# Patient Record
Sex: Female | Born: 1960
Health system: Southern US, Community
[De-identification: ages and names within clinical notes are randomized; demographics above are authoritative.]

## PROBLEM LIST (undated history)

## (undated) DIAGNOSIS — D509 Iron deficiency anemia, unspecified: Secondary | ICD-10-CM

## (undated) DIAGNOSIS — K760 Fatty (change of) liver, not elsewhere classified: Secondary | ICD-10-CM

## (undated) DIAGNOSIS — Z720 Tobacco use: Secondary | ICD-10-CM

## (undated) DIAGNOSIS — F1010 Alcohol abuse, uncomplicated: Secondary | ICD-10-CM

## (undated) DIAGNOSIS — D259 Leiomyoma of uterus, unspecified: Secondary | ICD-10-CM

## (undated) DIAGNOSIS — K859 Acute pancreatitis without necrosis or infection, unspecified: Secondary | ICD-10-CM

## (undated) DIAGNOSIS — D696 Thrombocytopenia, unspecified: Secondary | ICD-10-CM

## (undated) DIAGNOSIS — I10 Essential (primary) hypertension: Secondary | ICD-10-CM

## (undated) HISTORY — DX: Thrombocytopenia, unspecified: D69.6

## (undated) HISTORY — PX: OTHER SURGICAL HISTORY: SHX169

## (undated) HISTORY — DX: Alcohol abuse, uncomplicated: F10.10

## (undated) HISTORY — DX: Fatty (change of) liver, not elsewhere classified: K76.0

## (undated) HISTORY — DX: Leiomyoma of uterus, unspecified: D25.9

## (undated) HISTORY — DX: Tobacco use: Z72.0

## (undated) HISTORY — PX: BACK SURGERY: SHX140

## (undated) HISTORY — DX: Iron deficiency anemia, unspecified: D50.9

---

## 1998-07-07 ENCOUNTER — Encounter: Payer: Self-pay | Admitting: Emergency Medicine

## 1998-07-07 ENCOUNTER — Emergency Department (HOSPITAL_COMMUNITY): Admission: EM | Admit: 1998-07-07 | Discharge: 1998-07-07 | Payer: Self-pay | Admitting: Emergency Medicine

## 1999-05-24 ENCOUNTER — Emergency Department (HOSPITAL_COMMUNITY): Admission: EM | Admit: 1999-05-24 | Discharge: 1999-05-24 | Payer: Self-pay | Admitting: Emergency Medicine

## 2002-01-06 ENCOUNTER — Emergency Department (HOSPITAL_COMMUNITY): Admission: EM | Admit: 2002-01-06 | Discharge: 2002-01-06 | Payer: Self-pay | Admitting: Emergency Medicine

## 2002-01-06 ENCOUNTER — Encounter: Payer: Self-pay | Admitting: Emergency Medicine

## 2003-02-19 ENCOUNTER — Emergency Department (HOSPITAL_COMMUNITY): Admission: EM | Admit: 2003-02-19 | Discharge: 2003-02-19 | Payer: Self-pay | Admitting: Emergency Medicine

## 2003-10-31 ENCOUNTER — Emergency Department (HOSPITAL_COMMUNITY): Admission: EM | Admit: 2003-10-31 | Discharge: 2003-10-31 | Payer: Self-pay | Admitting: Emergency Medicine

## 2007-04-06 ENCOUNTER — Emergency Department (HOSPITAL_COMMUNITY): Admission: EM | Admit: 2007-04-06 | Discharge: 2007-04-06 | Payer: Self-pay | Admitting: Emergency Medicine

## 2007-06-12 ENCOUNTER — Emergency Department (HOSPITAL_COMMUNITY): Admission: EM | Admit: 2007-06-12 | Discharge: 2007-06-12 | Payer: Self-pay | Admitting: Emergency Medicine

## 2009-11-13 ENCOUNTER — Inpatient Hospital Stay (HOSPITAL_COMMUNITY): Admission: EM | Admit: 2009-11-13 | Discharge: 2009-11-17 | Payer: Self-pay | Admitting: Emergency Medicine

## 2009-11-13 ENCOUNTER — Ambulatory Visit: Payer: Self-pay | Admitting: Internal Medicine

## 2009-11-15 ENCOUNTER — Encounter: Payer: Self-pay | Admitting: Internal Medicine

## 2009-11-15 DIAGNOSIS — D259 Leiomyoma of uterus, unspecified: Secondary | ICD-10-CM | POA: Insufficient documentation

## 2009-11-15 DIAGNOSIS — F1010 Alcohol abuse, uncomplicated: Secondary | ICD-10-CM | POA: Insufficient documentation

## 2009-11-15 DIAGNOSIS — D509 Iron deficiency anemia, unspecified: Secondary | ICD-10-CM

## 2009-11-15 DIAGNOSIS — F17200 Nicotine dependence, unspecified, uncomplicated: Secondary | ICD-10-CM

## 2009-11-22 ENCOUNTER — Encounter: Payer: Self-pay | Admitting: Internal Medicine

## 2009-11-22 ENCOUNTER — Ambulatory Visit: Payer: Self-pay | Admitting: Internal Medicine

## 2009-11-22 DIAGNOSIS — D696 Thrombocytopenia, unspecified: Secondary | ICD-10-CM | POA: Insufficient documentation

## 2009-11-22 LAB — CONVERTED CEMR LAB
Basophils Absolute: 0.1 10*3/uL (ref 0.0–0.1)
Basophils Relative: 1 % (ref 0–1)
Eosinophils Absolute: 0.2 10*3/uL (ref 0.0–0.7)
Eosinophils Relative: 3 % (ref 0–5)
HCT: 34.8 % — ABNORMAL LOW (ref 36.0–46.0)
Hemoglobin: 10.2 g/dL — ABNORMAL LOW (ref 12.0–15.0)
Lymphocytes Relative: 25 % (ref 12–46)
Lymphs Abs: 1.8 10*3/uL (ref 0.7–4.0)
MCHC: 29.3 g/dL — ABNORMAL LOW (ref 30.0–36.0)
MCV: 74.8 fL — ABNORMAL LOW (ref >=78.0)
Monocytes Absolute: 0.6 10*3/uL (ref 0.1–1.0)
Monocytes Relative: 8 % (ref 3–12)
Neutro Abs: 4.4 10*3/uL (ref 1.7–7.7)
Neutrophils Relative %: 63 % (ref 43–77)
Platelets: 65 10*3/uL — ABNORMAL LOW (ref 150–400)
RBC: 4.65 M/uL (ref 3.87–5.11)
RDW: 28.9 % — ABNORMAL HIGH (ref 11.5–15.5)
WBC: 7 10*3/uL (ref 4.0–10.5)

## 2009-11-23 LAB — CONVERTED CEMR LAB

## 2009-11-29 ENCOUNTER — Ambulatory Visit: Payer: Self-pay | Admitting: Internal Medicine

## 2009-11-30 LAB — CONVERTED CEMR LAB
Basophils Absolute: 0.1 10*3/uL (ref 0.0–0.1)
Basophils Relative: 1 % (ref 0–1)
Eosinophils Absolute: 0.7 10*3/uL (ref 0.0–0.7)
MCHC: 28.7 g/dL — ABNORMAL LOW (ref 30.0–36.0)
MCV: 80 fL (ref >=78.0)
Monocytes Relative: 6 % (ref 3–12)
Neutro Abs: 5.6 10*3/uL (ref 1.7–7.7)
Neutrophils Relative %: 63 % (ref 43–77)
RBC: 4.79 M/uL (ref 3.87–5.11)
RDW: 28.5 % — ABNORMAL HIGH (ref 11.5–15.5)

## 2009-12-20 ENCOUNTER — Telehealth: Payer: Self-pay | Admitting: Internal Medicine

## 2010-03-08 NOTE — Miscellaneous (Signed)
Summary: PATIENT CONSENT FORM  PATIENT CONSENT FORM   Imported By: Louretta Parma 11/29/2009 16:51:55  _____________________________________________________________________  External Attachment:    Type:   Image     Comment:   External Document

## 2010-03-08 NOTE — Discharge Summary (Signed)
Summary: Hospital Discharge Update    Hospital Discharge Update:  Date of Admission: 11/13/2009 Date of Discharge: 11/17/2009  Brief Summary:  50 y/o woman with PMH significant for menorrhagia presented with severe microcytic iron deficiency anemia. Transvaginal USG showed uterine fibroids. Patient was transfused 3 units of PRBCs and started on iron supplementation. GI was consulted regarding possible colonic etiology for bleeding, as patient did endorse having episodes of blood streaked stools.  Patient was discharged home with outpatient follow up with [facility] to discuss further management with uterine firbroids. She underwent colonoscopy on 10/12 for her hematochezia which showed some internal hemmorhoids.  Labs needed at follow-up: CBC with differential  Other follow-up issues:  Please counsel patient regarding alcohol use.    Problem list changes:  Added new problem of ANEMIA, IRON DEFICIENCY (ICD-280.9) Added new problem of FIBROIDS, UTERUS (ICD-218.9) Added new problem of TOBACCO ABUSE (ICD-305.1) Added new problem of ALCOHOL ABUSE (ICD-305.00) Added new problem of TRICHOMONAL VULVOVAGINITIS (ICD-131.01)  Medication list changes:  Added new medication of FERRO-BOB 325 (65 FE) MG TABS (FERROUS SULFATE) Take 0ne tablet with food three times a day - Signed Added new medication of METRONIDAZOLE 500 MG TABS (METRONIDAZOLE) Take one tab by mouth two times a day for 3 more days. - Signed Rx of FERRO-BOB 325 (65 FE) MG TABS (FERROUS SULFATE) Take 0ne tablet with food three times a day;  #30 x 3;  Signed;  Entered by: Elyse Jarvis;  Authorized by: Elyse Jarvis;  Method used: Print then Give to Patient Rx of METRONIDAZOLE 500 MG TABS (METRONIDAZOLE) Take one tab by mouth two times a day for 3 more days.;  #6 x 0;  Signed;  Entered by: Elyse Jarvis;  Authorized by: Elyse Jarvis;  Method used: Print then Give to Patient  Discharge medications:  FERRO-BOB 325 (65 FE) MG TABS  (FERROUS SULFATE) Take 0ne tablet with food three times a day METRONIDAZOLE 500 MG TABS (METRONIDAZOLE) Take one tab by mouth two times a day for 3 more days.  Other patient instructions:  Please follow up with [facility] outpatient clinic on November 11th, at 2.30 pm. Please follow up with [facility] outpatient clinic, located on the ground floor of the [facility] near the cafeteria with Dr. Scot Dock on October 17th, at 1:30PM. Please come to the clinic at 12:15 to have your blood drawn, to see your blood levels.   Note: Hospital Discharge Medications & Other Instructions handout was printed, one copy for patient and a second copy to be placed in hospital chart.    Complete Medication List: 1)  Ferro-bob 325 (65 Fe) Mg Tabs (Ferrous sulfate) .... Take 0ne tablet with food three times a day 2)  Metronidazole 500 Mg Tabs (Metronidazole) .... Take one tab by mouth two times a day for 3 more days.   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 Lives with her grown daughter who is currently pregnant Single Sexually active with men only Drinks a six pack of beer daily    Appended Document: Orders Update    Clinical Lists Changes  Orders: Added new Test order of T-CBC w/Diff [phone]) - Signed      Process Orders Check Orders Results:     [facility]: ABN not required for this insurance Order queued for requisitioning for [facility]: November 22, 2009 12:04 PM  Tests Sent for requisitioning (November 22, 2009 2:19 PM):     11/22/2009: [facility] -- [facility] w/Diff [[id number]] (signed)

## 2010-03-08 NOTE — Assessment & Plan Note (Signed)
Summary: EST-1 WEEK RECHECK/CH   Vital Signs:  Patient profile:   50 year old female Height:      66.5 inches Weight:      163.2 pounds BMI:     26.04 Temp:     98.3 degrees F oral Pulse rate:   64 / minute BP sitting:   104 / 64  (right arm)  Vitals Entered By: Filomena Jungling NT II (November 29, 2009 2:02 PM) CC: follow-up visit Is Patient Diabetic? No Pain Assessment Patient in pain? no      Nutritional Status BMI of 25 - 29 = overweight  Have you ever been in a relationship where you felt threatened, hurt or afraid?No   Does patient need assistance? Functional Status Self care Ambulation Normal   CC:  follow-up visit.  History of Present Illness: 50 Y/O W WITH H/O IRON DEF ANEMIA AND THROMBOCYTOPENIA (NO CAUSE IDENTIFIED YET) comes for repeat CBC check. no new complaints started taking iron tablets   Preventive Screening-Counseling & Management  Alcohol-Tobacco     Smoking Status: current     Year Started: AGE 30     Cigars/week: 4 A DAY  Current Medications (verified): 1)  Ferro-Bob 325 (65 Fe) Mg Tabs (Ferrous Sulfate) .... Take 0ne Tablet With Food Three Times A Day  Allergies (verified): No Known Drug Allergies  Review of Systems  The patient denies anorexia, fever, weight loss, weight gain, vision loss, decreased hearing, hoarseness, chest pain, syncope, dyspnea on exertion, peripheral edema, prolonged cough, headaches, hemoptysis, abdominal pain, melena, hematochezia, severe indigestion/heartburn, hematuria, incontinence, genital sores, muscle weakness, suspicious skin lesions, transient blindness, difficulty walking, depression, unusual weight change, abnormal bleeding, enlarged lymph nodes, angioedema, breast masses, and testicular masses.    Physical Exam  General:  Gen: VS reveiwed, Alert, well developed, nodistress ENT: mucous membranes pink & moist. No abnormal finds in ear and nose. CVC:S1 S2 , no murmurs, no abnormal heart sounds. Lungs: Clear  to auscultation B/L. No wheezes, crackles or other abnormal sounds Abdomen: soft, non distended, no tender. Normal Bowel sounds EXT: no pitting edema, no engorged veins, Pulsations normal  Neuro:alert, oriented *3, cranial nerved 2-12 intact, strenght normal in all  extremities, senstations normal to light touch.      Impression & Recommendations:  Problem # 1:  THROMBOCYTOPENIA (ICD-287.5)  recheck CBC today  Orders: T-CBC w/Diff ([id number])  Problem # 2:  ANEMIA, IRON DEFICIENCY (ICD-280.9)  started taking iron tablets. recheck CBC  Her updated medication list for this problem includes:    Ferro-bob 325 (65 Fe) Mg Tabs (Ferrous sulfate) .Marland Kitchen... Take 0ne tablet with food three times a day  Orders: T-CBC w/Diff ([id number])  Complete Medication List: 1)  Ferro-bob 325 (65 Fe) Mg Tabs (Ferrous sulfate) .... Take 0ne tablet with food three times a day  Patient Instructions: 1)  Please schedule a follow-up appointment in 3 months.   Orders Added: 1)  Est. Patient Level III [99213] 2)  T-CBC w/Diff [[id number]]    Prevention & Chronic Care Immunizations   Influenza vaccine: Not documented    Tetanus booster: Not documented    Pneumococcal vaccine: Not documented  Other Screening   Pap smear: Not documented    Mammogram: Not documented   Smoking status: current  (11/29/2009)  Lipids   Total Cholesterol: Not documented   LDL: Not documented   LDL Direct: Not documented   HDL: Not documented   Triglycerides: Not documented

## 2010-03-08 NOTE — Progress Notes (Signed)
Summary: GYN APPOINTMENT   Phone Note Call from Patient Call back at Home Phone [phone]   Caller: Patient Reason for Call: Referral Action Taken: Appt Scheduled Details for Reason: GYN APPOINTMENT Summary of Call: PATIENT CALLED ON FRIDAY AND WAS GIVEN THE WRONG DATE FOR GYN, AND THEY HAD ANOTHER APPOINTMENT IN THE APPOINTMENT THAT PATIENT DID NOT NO ABOUT.PAPERWORK AND DISCHARGE SUMMARY WAS SENT TO [facility] AND APPOINTMENT WAS GIVEN FOR  01-06-2010@1 ;30.PATIENT WAS CONTACTED ABOUT THIS APPOINTMENT.I WILL ALSO MAIL LETTER. Initial call taken by: [facility] NT II,  December 20, 2009 2:55 PM

## 2010-03-08 NOTE — Assessment & Plan Note (Signed)
Summary: NEW HFU STAT LABS/CH/SIDHU/CH   Vital Signs:  Patient profile:   50 year old female Height:      66.5 inches Weight:      162 pounds BMI:     25.85 Temp:     97.9 degrees F oral Pulse rate:   62 / minute BP sitting:   122 / 76  (right arm)  Vitals Entered By: Filomena Jungling NT II (November 22, 2009 1:30 PM) CC: HFU Is Patient Diabetic? No Pain Assessment Patient in pain? no       Have you ever been in a relationship where you felt threatened, hurt or afraid?No   Does patient need assistance? Functional Status Self care Ambulation Normal   CC:  HFU.  History of Present Illness: 50 y/o w with iron def anemia 2/2 fibroids comes for hopsital follow up  she has not had any vaginal bleeding since d/c has not been taking iron tabs due to financial reasons has appointment with ob-gyn for fibroids on 11/11 her Hb at d/c was 8 and platelets was normal no new complaints    Preventive Screening-Counseling & Management  Alcohol-Tobacco     Smoking Status: current     Year Started: AGE 43     Cigars/week: 4 A DAY  Current Medications (verified): 1)  Ferro-Bob 325 (65 Fe) Mg Tabs (Ferrous Sulfate) .... Take 0ne Tablet With Food Three Times A Day  Allergies (verified): No Known Drug Allergies  Social History: Smoking Status:  current  Review of Systems  The patient denies anorexia, fever, weight loss, weight gain, vision loss, decreased hearing, hoarseness, chest pain, syncope, dyspnea on exertion, peripheral edema, prolonged cough, headaches, hemoptysis, abdominal pain, melena, hematochezia, severe indigestion/heartburn, hematuria, incontinence, genital sores, muscle weakness, suspicious skin lesions, transient blindness, difficulty walking, depression, unusual weight change, abnormal bleeding, enlarged lymph nodes, angioedema, breast masses, and testicular masses.    Physical Exam  General:  General: vital reviewed. Alert, well developed and in no acurte  distress ENT: mucous membranes pale & moist. No abnormal finds in ear and nose. CVC:S1 S2 , no murmurs, no abnormal heart sounds. Lungs: Clear to auscultation B/L. No wheezes, crackles or other abnormal sounds Abdomen: soft, non distended, no tender. Normal Bowel sounds EXT: no pitting edema, no engorged veins, Pulsations normal  Neuro:alert, oriented *3, cranial nerved 2-12 intact, strenght normal in all  extremities, senstations normal to light touch.     Impression & Recommendations:  Problem # 1:  ANEMIA, IRON DEFICIENCY (ICD-280.9) Hb better than it was at time of discharge patient has not been taking iron tablets due to financial problems no bleeding (vagina, stool, vomit). patient has not had her menstrual cycles yet  plan - encourge meds compliance, she says she will buy meds this friday - SW consult for medication assistance  - Ob-Gyn follow up on 11/11 for fibroids   Her updated medication list for this problem includes:    Ferro-bob 325 (65 Fe) Mg Tabs (Ferrous sulfate) .Marland Kitchen... Take 0ne tablet with food three times a day  Problem # 2:  THROMBOCYTOPENIA (ICD-287.5) paltelets normal in hospital although level was consistently falling. no heparin given in hospital  plan -recheck in a week - peripheral smear - HIV  Orders: T-HIV Antibody  (Reflex) ([id number]) T- * Misc. Laboratory test ([phone])  Complete Medication List: 1)  Ferro-bob 325 (65 Fe) Mg Tabs (Ferrous sulfate) .... Take 0ne tablet with food three times a day  Patient Instructions: 1)  Follow up in  1 week 2)  You have low platelet count in your blood. This could cause you to have easy bruising. We will test your blood for the cause of low platelet, which includes an HIV test. We will call you if any of these blood tests are abnormal. We will see you back in 1 week for repeat blood work.    Orders Added: 1)  T-HIV Antibody  (Reflex) [[id number]] 2)  T- * Misc. Laboratory test [[id number]] 3)  Est. Patient  Level IV [99214]    Prevention & Chronic Care Immunizations   Influenza vaccine: Not documented    Tetanus booster: Not documented    Pneumococcal vaccine: Not documented  Other Screening   Pap smear: Not documented    Mammogram: Not documented   Smoking status: current  (11/22/2009)  Lipids   Total Cholesterol: Not documented   LDL: Not documented   LDL Direct: Not documented   HDL: Not documented   Triglycerides: Not documented   Process Orders Check Orders Results:     [facility]: ABN not required for this insurance Order queued for requisitioning for [facility]: November 22, 2009 2:20 PM  Tests Sent for requisitioning (November 22, 2009 2:20 PM):     11/22/2009: [facility] -- T-HIV Antibody  (Reflex) [[id number]] (signed)     11/22/2009: [facility] -- T- * Misc. Laboratory test [phone] (signed)

## 2010-03-09 ENCOUNTER — Ambulatory Visit: Admit: 2010-03-09 | Payer: Self-pay | Admitting: Obstetrics & Gynecology

## 2010-03-09 ENCOUNTER — Encounter: Payer: Self-pay | Admitting: Obstetrics & Gynecology

## 2010-04-05 ENCOUNTER — Ambulatory Visit (INDEPENDENT_AMBULATORY_CARE_PROVIDER_SITE_OTHER): Payer: Self-pay | Admitting: Ophthalmology

## 2010-04-05 ENCOUNTER — Encounter: Payer: Self-pay | Admitting: Internal Medicine

## 2010-04-05 DIAGNOSIS — D509 Iron deficiency anemia, unspecified: Secondary | ICD-10-CM

## 2010-04-05 DIAGNOSIS — D696 Thrombocytopenia, unspecified: Secondary | ICD-10-CM

## 2010-04-05 DIAGNOSIS — D259 Leiomyoma of uterus, unspecified: Secondary | ICD-10-CM

## 2010-04-05 NOTE — Progress Notes (Signed)
  Subjective:    Patient ID: Evelyn Crawford, female    DOB: 1960/12/30, 50 y.o.   MRN: [id number]  HPI This is a 50 year old female with a past medical hx significant for thrombocytopenia of unknown etiology and iron deficiency anemia felt to be the result of chronic blood loss due to uterine fibroids who was last seen 10/11 by Dr. Scot Dock for a repeat CBC check. At that time Hgb was 11.0 and platlets were 125. This is improved from prior on 10/17.  Pt was started on Iron and referred to OB.  The patient was hospitalized back in October for a Hgb of 5.3 requiring transfusion and transvaginal ultrasound showed a large uterine fibroid.  Pt was scheduled to fu with GYN, and pt went but she did not have money so they rescheduled her and she missed the rescheduled appt. Needs to see debra Hill today to file for her orange card.  LMP was Feb 8th, and pt had less bleeding than usual periods.  Prior to her hospitalization, pt had experienced blood in her stool but she denies this now. Pt also denies dizzyness or lightheadedness.    Review of Systems  Constitutional: Negative for fever and chills.  Respiratory: Negative for cough and shortness of breath.   Cardiovascular: Negative for chest pain and palpitations.  Gastrointestinal: Negative for vomiting, diarrhea and constipation.       Objective:   Physical Exam  Constitutional: She appears well-developed and well-nourished.  HENT:  Head: Normocephalic and atraumatic.  Eyes: Pupils are equal, round, and reactive to light.  Cardiovascular: Normal rate, regular rhythm and intact distal pulses.  Exam reveals no gallop and no friction rub.   No murmur heard. Pulmonary/Chest: Effort normal and breath sounds normal. She has no wheezes. She has no rales.  Abdominal: Soft. Bowel sounds are normal. She exhibits no distension. There is no tenderness.  Musculoskeletal: Normal range of motion.  Neurological: She is alert. No cranial nerve deficit.  Skin: No  rash noted.        Current Outpatient Prescriptions on File Prior to Visit  Medication Sig Dispense Refill  . ferrous sulfate 325 (65 FE) MG tablet Take 325 mg by mouth 3 (three) times daily with meals.          Past Medical History  Diagnosis Date  . Thrombocytopenia   . Alcohol abuse   . Tobacco abuse   . Uterine fibroid   . Iron deficiency anemia      Assessment & Plan:

## 2010-04-05 NOTE — Assessment & Plan Note (Signed)
The patients LMP was Feb 8th and was lighter than usual.  The patient needs to be seen by GYN and pt will possibly require hysterectomy.  I will re-refer the patient today and have her discuss her financial situation with Jaynee Eagles so ensure that she is able to afford her appointment.

## 2010-04-05 NOTE — Patient Instructions (Addendum)
Please see Jaynee Eagles on your way out today and send in the documentation needed for you to apply for the orange card.  Please continue taking your iron and follow up with your regular doctor in 1-2 months to follow up on your GYN appointment.

## 2010-04-05 NOTE — Assessment & Plan Note (Signed)
The etiology of this is unclear, will recheck CBC today but if pt continues to have a thrombocytopenia, she will require further work up.

## 2010-04-05 NOTE — Assessment & Plan Note (Addendum)
Will recheck a CBC today.  Pt does not have overt signs of anemia at this point and denies any symptoms.  I will continue the patient on her iron at this time.

## 2010-04-06 LAB — CBC
Hemoglobin: 11.4 g/dL — ABNORMAL LOW (ref 12.0–15.0)
MCH: 29.2 pg (ref 26.0–34.0)
MCHC: 31.6 g/dL (ref 30.0–36.0)
Platelets: 192 10*3/uL (ref 150–400)
RBC: 3.91 MIL/uL (ref 3.87–5.11)

## 2010-04-20 LAB — BASIC METABOLIC PANEL
CO2: 22 mEq/L (ref 19–32)
CO2: 23 mEq/L (ref 19–32)
Calcium: 8.9 mg/dL (ref 8.4–10.5)
Calcium: 9 mg/dL (ref 8.4–10.5)
Calcium: 9.1 mg/dL (ref 8.4–10.5)
Chloride: 111 mEq/L (ref 96–112)
GFR calc Af Amer: >60 mL/min (ref >60)
GFR calc Af Amer: >60 mL/min (ref >60)
GFR calc non Af Amer: >60 mL/min (ref >60)
Glucose, Bld: 102 mg/dL — ABNORMAL HIGH (ref 70–99)
Potassium: 3.6 mEq/L (ref 3.5–5.1)
Potassium: 3.7 mEq/L (ref 3.5–5.1)
Potassium: 3.8 mEq/L (ref 3.5–5.1)
Sodium: 136 mEq/L (ref 135–145)
Sodium: 137 mEq/L (ref 135–145)
Sodium: 137 mEq/L (ref 135–145)

## 2010-04-20 LAB — CBC
HCT: 24.6 % — ABNORMAL LOW (ref 36.0–46.0)
HCT: 25.6 % — ABNORMAL LOW (ref 36.0–46.0)
HCT: 28.2 % — ABNORMAL LOW (ref 36.0–46.0)
HCT: 29.2 % — ABNORMAL LOW (ref 36.0–46.0)
Hemoglobin: 7.2 g/dL — ABNORMAL LOW (ref 12.0–15.0)
Hemoglobin: 7.5 g/dL — ABNORMAL LOW (ref 12.0–15.0)
Hemoglobin: 8.2 g/dL — ABNORMAL LOW (ref 12.0–15.0)
Hemoglobin: 8.5 g/dL — ABNORMAL LOW (ref 12.0–15.0)
MCH: 20.4 pg — ABNORMAL LOW (ref 26.0–34.0)
MCH: 20.6 pg — ABNORMAL LOW (ref 26.0–34.0)
MCH: 20.6 pg — ABNORMAL LOW (ref 26.0–34.0)
MCHC: 25.5 g/dL — ABNORMAL LOW (ref 30.0–36.0)
MCHC: 29.1 g/dL — ABNORMAL LOW (ref 30.0–36.0)
MCHC: 29.1 g/dL — ABNORMAL LOW (ref 30.0–36.0)
MCHC: 29.3 g/dL — ABNORMAL LOW (ref 30.0–36.0)
MCV: 70.4 fL — ABNORMAL LOW (ref 78.0–100.0)
MCV: 72.1 fL — ABNORMAL LOW (ref 78.0–100.0)
Platelets: 175 10*3/uL (ref 150–400)
Platelets: 361 10*3/uL (ref 150–400)
RBC: 3.53 MIL/uL — ABNORMAL LOW (ref 3.87–5.11)
RBC: 3.67 MIL/uL — ABNORMAL LOW (ref 3.87–5.11)
RDW: 28.4 % — ABNORMAL HIGH (ref 11.5–15.5)
RDW: 28.8 % — ABNORMAL HIGH (ref 11.5–15.5)
WBC: 6.9 10*3/uL (ref 4.0–10.5)
WBC: 8.1 10*3/uL (ref 4.0–10.5)

## 2010-04-20 LAB — URINALYSIS, ROUTINE W REFLEX MICROSCOPIC
Bilirubin Urine: NEGATIVE
Glucose, UA: NEGATIVE mg/dL
Hgb urine dipstick: NEGATIVE
Ketones, ur: NEGATIVE mg/dL
Nitrite: NEGATIVE
Protein, ur: NEGATIVE mg/dL
Specific Gravity, Urine: 1.017 (ref 1.005–1.030)
Urobilinogen, UA: 0.2 mg/dL (ref 0.0–1.0)
pH: 5.5 (ref 5.0–8.0)

## 2010-04-20 LAB — PROTIME-INR
INR: 1.13 (ref 0.00–1.49)
Prothrombin Time: 14.7 seconds (ref 11.6–15.2)

## 2010-04-20 LAB — PREPARE RBC (CROSSMATCH)

## 2010-04-20 LAB — GC/CHLAMYDIA PROBE AMP, GENITAL: GC Probe Amp, Genital: NEGATIVE

## 2010-04-20 LAB — HEPATIC FUNCTION PANEL
ALT: 16 U/L (ref 0–35)
AST: 21 U/L (ref 0–37)
Albumin: 3.1 g/dL — ABNORMAL LOW (ref 3.5–5.2)
Bilirubin, Direct: <0.1 mg/dL (ref 0.0–0.3)
Total Bilirubin: 0.2 mg/dL — ABNORMAL LOW (ref 0.3–1.2)

## 2010-04-20 LAB — POCT I-STAT, CHEM 8
BUN: 4 mg/dL — ABNORMAL LOW (ref 6–23)
Calcium, Ion: 1.26 mmol/L (ref 1.12–1.32)
HCT: 23 % — ABNORMAL LOW (ref 36.0–46.0)
Hemoglobin: 7.8 g/dL — ABNORMAL LOW (ref 12.0–15.0)
Sodium: 142 mEq/L (ref 135–145)
TCO2: 21 mmol/L (ref 0–100)

## 2010-04-20 LAB — CROSSMATCH
ABO/RH(D): O NEG
Antibody Screen: NEGATIVE

## 2010-04-20 LAB — DIFFERENTIAL
Basophils Absolute: 0.1 10*3/uL (ref 0.0–0.1)
Eosinophils Absolute: 0.2 10*3/uL (ref 0.0–0.7)
Lymphocytes Relative: 23 % (ref 12–46)
Monocytes Absolute: 0.6 10*3/uL (ref 0.1–1.0)
Neutrophils Relative %: 66 % (ref 43–77)

## 2010-04-20 LAB — PREGNANCY, URINE: Preg Test, Ur: NEGATIVE

## 2010-04-20 LAB — URINE MICROSCOPIC-ADD ON

## 2010-04-20 LAB — IRON AND TIBC: Iron: <10 ug/dL — ABNORMAL LOW (ref 42–135)

## 2010-04-20 LAB — WET PREP, GENITAL

## 2010-04-20 LAB — FERRITIN: Ferritin: 2 ng/mL — ABNORMAL LOW (ref 10–291)

## 2010-04-20 LAB — RETICULOCYTES
RBC.: 3.01 MIL/uL — ABNORMAL LOW (ref 3.87–5.11)
Retic Ct Pct: 0.7 % (ref 0.4–3.1)

## 2010-04-20 LAB — ETHANOL: Alcohol, Ethyl (B): <5 mg/dL (ref 0–10)

## 2010-04-20 LAB — HEMOCCULT GUIAC POC 1CARD (OFFICE): Fecal Occult Bld: NEGATIVE

## 2010-04-20 LAB — GLUCOSE, CAPILLARY: Glucose-Capillary: 102 mg/dL — ABNORMAL HIGH (ref 70–99)

## 2010-08-02 ENCOUNTER — Ambulatory Visit (INDEPENDENT_AMBULATORY_CARE_PROVIDER_SITE_OTHER): Payer: Self-pay | Admitting: Internal Medicine

## 2010-08-02 ENCOUNTER — Encounter: Payer: Self-pay | Admitting: Internal Medicine

## 2010-08-02 VITALS — BP 109/71 | HR 73 | Temp 98.7°F | Ht 66.0 in | Wt 163.2 lb

## 2010-08-02 DIAGNOSIS — F1010 Alcohol abuse, uncomplicated: Secondary | ICD-10-CM

## 2010-08-02 DIAGNOSIS — D219 Benign neoplasm of connective and other soft tissue, unspecified: Secondary | ICD-10-CM

## 2010-08-02 DIAGNOSIS — D259 Leiomyoma of uterus, unspecified: Secondary | ICD-10-CM

## 2010-08-02 NOTE — Assessment & Plan Note (Signed)
Drinks 2 beers a day and occasional hard liquor. She could not tell me the reason why she drinks every day. Does not have any specific stresses in life except financial difficulties. She wants to quit but have not really tried it. Councelled extensively about the side effects of this alcohol and methods to help quit. Given information about alcohol anonymous

## 2010-08-02 NOTE — Assessment & Plan Note (Addendum)
She has been having heavy menstrual bleeding for 2 days. She has on and off episodes of vaginal bleeding and abdominal pain. See history of present illness for details on history of fibroids. Plan -Check CBC -Refer to OB/GYN -She will see the financial counselor in a week and hopefully we'll be able to get her orange card soon. This will work at the [facility] -If her hemoglobin seriously low, this will require hospital admission. -Instructed to return to the clinic or come to the emergency room if she is having severe bleeding or abdominal pain Continue on iron tablets with meals. If still not able to tolerate, change to an alternative tablets.-

## 2010-08-02 NOTE — Patient Instructions (Signed)
Follow up in 3-4 months Come to see Jaynee Eagles next week for your orange card. Once that gets done, call the women's health for an appointment to see gynecologist.  Continue taking iron pills three times a day with meals.  Call the clinic or come to the emergency room if you are having a lot of pain, bleeding or other complaints.

## 2010-08-02 NOTE — Progress Notes (Signed)
50 year old woman with past medical history of fibroids and history of severe iron deficiency anemia requiring hospitalization comes to the clinic for followup. No complaints today.  Fibroids: she has been having  fibroid problems for years now. Transvaginal ultrasound in October 2011 showed a fibroid measuring 7 cm in size. She is having menorrhagia and episodic pain. She has not been compliant with her iron tablets because of GI distress. She has not followed up with GYN because of financial problems.  She has been having heavy menstrual bleeding for last 2 days.  BP 109/71  Pulse 73  Temp(Src) 98.7 F (37.1 C) (Oral)  Ht 5\' 6"  (1.676 m)  Wt 163 lb 3.2 oz (74.027 kg)  BMI 26.34 kg/m2  SpO2 100%  LMP 07/29/2010  General Appearance:    Alert, cooperative, no distress, appears stated age  Head:    Normocephalic, without obvious abnormality, atraumatic  Eyes:    PERRL, conjunctiva/corneas clear, EOM's intact, fundi    benign, both eyes  Ears:    Normal TM's and external ear canals, both ears  Nose:   Nares normal, septum midline, mucosa normal, no drainage    or sinus tenderness  Throat:   Lips, mucosa, and tongue normal; teeth and gums normal  Neck:   Supple, symmetrical, trachea midline, no adenopathy;    thyroid:  no enlargement/tenderness/nodules; no carotid   bruit or JVD  Back:     Symmetric, no curvature, ROM normal, no CVA tenderness  Lungs:     Clear to auscultation bilaterally, respirations unlabored  Chest Wall:    No tenderness or deformity   Heart:    Regular rate and rhythm, S1 and S2 normal, no murmur, rub   or gallop  Abdomen:     Soft, non-tender, bowel sounds active all four quadrants,    no masses, no organomegaly  Extremities:   Extremities normal, atraumatic, no cyanosis or edema  Pulses:   2+ and symmetric all extremities  Skin:   Skin color, texture, turgor normal, no rashes or lesions  Lymph nodes:   Cervical, supraclavicular, and axillary nodes normal    Neurologic:   CNII-XII intact, normal strength, sensation and reflexes    throughout  Constitutional: Denies fever, chills, diaphoresis, appetite change. has fatigue.  HEENT: Denies photophobia, eye pain, redness, hearing loss, ear pain, congestion, sore throat, rhinorrhea, sneezing, mouth sores, trouble swallowing, neck pain, neck stiffness and tinnitus.   Respiratory: Denies SOB, DOE, cough, chest tightness,  and wheezing.   Cardiovascular: Denies chest pain, palpitations and leg swelling.  Gastrointestinal: Denies nausea, vomiting, abdominal pain, diarrhea, constipation, blood in stool and abdominal distention.  Genitourinary: Denies dysuria, urgency, frequency, hematuria, flank pain and difficulty urinating.  Musculoskeletal: Denies myalgias, back pain, joint swelling, arthralgias and gait problem.  Skin: Denies pallor, rash and wound.  Neurological: Denies dizziness, seizures, syncope, weakness, light-headedness, numbness and headaches.  Hematological: Denies adenopathy. Easy bruising, personal or family bleeding history  Psychiatric/Behavioral: Denies suicidal ideation, mood changes, confusion, nervousness, sleep disturbance and agitation

## 2010-08-03 LAB — CBC WITH DIFFERENTIAL/PLATELET
Basophils Absolute: 0 10*3/uL (ref 0.0–0.1)
Basophils Relative: 1 % (ref 0–1)
MCHC: 28.3 g/dL — ABNORMAL LOW (ref 30.0–36.0)
Neutro Abs: 4.3 10*3/uL (ref 1.7–7.7)
Neutrophils Relative %: 64 % (ref 43–77)
Platelets: 201 10*3/uL (ref 150–400)
RDW: 20.1 % — ABNORMAL HIGH (ref 11.5–15.5)

## 2010-08-03 NOTE — Progress Notes (Signed)
Addended by: Scot Dock, Edan Serratore V on: 08/03/2010 04:47 PM   Modules accepted: Orders

## 2010-08-11 ENCOUNTER — Telehealth: Payer: Self-pay | Admitting: Internal Medicine

## 2010-08-11 NOTE — Telephone Encounter (Signed)
Message copied by Zollie Beckers on Thu Aug 11, 2010 12:35 PM ------      Message from: Ulyess Mort      Created: Fri Aug 05, 2010  3:13 PM       Madhav,            Patients with serious problems can be seen regardless of insurance.  You should call the Gyn clinic yourself and explain the problem to a doctor.  This cannot be arranged between your nurse and the secretary at [facility].      ----- Message -----         From: Bethel Born         Sent: 08/03/2010   4:45 PM           To: Ulyess Mort            Patient's Hb dropped from 11.4 to 8.2 in 4 months. She has heavy menstrual bleeding since past 2 days.  This is because of fibroids. She does not have money or insurance for Ob-Gyn follow up. I will get her back by the end of this week for repeat CBC. If its still lower, will probably need admission for inpatient ob-gyn workup. Will d/w attending in the clinic when I get the next CBC

## 2010-08-12 NOTE — Telephone Encounter (Signed)
error 

## 2010-08-15 ENCOUNTER — Telehealth: Payer: Self-pay | Admitting: *Deleted

## 2010-08-15 ENCOUNTER — Ambulatory Visit: Payer: Self-pay | Admitting: Internal Medicine

## 2010-08-15 NOTE — Telephone Encounter (Addendum)
Attempts to reach pt about her appointment today.  Spoke with female at home pt has already left for work.  Pt will need to bring in information to be certified through [facility] so that a referral can be sent to [facility].  Message left with female who answered the phone that pt will need to call the Clinics.

## 2010-08-25 ENCOUNTER — Telehealth: Payer: Self-pay | Admitting: *Deleted

## 2010-08-25 NOTE — Telephone Encounter (Signed)
Call to pt to schedule her for a follow up appointment.  Message was left with a female who answered the phone previously.  Pt has not scheduled a follow up appointment.  Pt will also need to apply for the [facility] Card if she is without insurance so that referral to the GYN Clinic can be completed.

## 2010-09-12 ENCOUNTER — Telehealth: Payer: Self-pay | Admitting: *Deleted

## 2010-09-12 NOTE — Telephone Encounter (Signed)
Referral was faxed to [facility] for an appointment.

## 2010-09-13 ENCOUNTER — Encounter: Payer: Self-pay | Admitting: Internal Medicine

## 2010-10-28 LAB — URINALYSIS, ROUTINE W REFLEX MICROSCOPIC
Bilirubin Urine: NEGATIVE
Glucose, UA: NEGATIVE
Ketones, ur: NEGATIVE
Nitrite: NEGATIVE
Protein, ur: NEGATIVE
Specific Gravity, Urine: 1.02
pH: 7

## 2010-10-28 LAB — URINE MICROSCOPIC-ADD ON

## 2010-10-28 LAB — PREGNANCY, URINE: Preg Test, Ur: NEGATIVE

## 2010-10-28 LAB — GC/CHLAMYDIA PROBE AMP, GENITAL: GC Probe Amp, Genital: NEGATIVE

## 2010-11-02 ENCOUNTER — Ambulatory Visit (INDEPENDENT_AMBULATORY_CARE_PROVIDER_SITE_OTHER): Payer: Self-pay | Admitting: Obstetrics & Gynecology

## 2010-11-02 ENCOUNTER — Other Ambulatory Visit (HOSPITAL_COMMUNITY)
Admission: RE | Admit: 2010-11-02 | Discharge: 2010-11-02 | Disposition: A | Discharge status: Home / Self Care | Payer: Self-pay | Source: Ambulatory Visit | Attending: Obstetrics & Gynecology | Admitting: Obstetrics & Gynecology

## 2010-11-02 ENCOUNTER — Other Ambulatory Visit: Payer: Self-pay | Admitting: Obstetrics & Gynecology

## 2010-11-02 ENCOUNTER — Encounter: Payer: Self-pay | Admitting: Obstetrics & Gynecology

## 2010-11-02 VITALS — BP 116/76 | HR 74 | Temp 97.2°F | Ht 65.0 in | Wt 158.3 lb

## 2010-11-02 DIAGNOSIS — N938 Other specified abnormal uterine and vaginal bleeding: Secondary | ICD-10-CM

## 2010-11-02 DIAGNOSIS — N949 Unspecified condition associated with female genital organs and menstrual cycle: Secondary | ICD-10-CM

## 2010-11-02 DIAGNOSIS — D259 Leiomyoma of uterus, unspecified: Secondary | ICD-10-CM | POA: Insufficient documentation

## 2010-11-02 DIAGNOSIS — N925 Other specified irregular menstruation: Secondary | ICD-10-CM

## 2010-11-02 LAB — COMPREHENSIVE METABOLIC PANEL
Alkaline Phosphatase: 67 U/L (ref 39–117)
BUN: 10 mg/dL (ref 6–23)
Creat: 0.68 mg/dL (ref 0.50–1.10)
Glucose, Bld: 93 mg/dL (ref 70–99)
Sodium: 138 mEq/L (ref 135–145)
Total Bilirubin: 0.4 mg/dL (ref 0.3–1.2)

## 2010-11-02 NOTE — Progress Notes (Signed)
  Subjective:    Patient ID: Evelyn Crawford, female    DOB: Feb 08, 1960, 50 y.o.   MRN: [id number]  HPI  Ms. Brownlee is here because her periods have become very heavy and more painful.  She was told she has multiple fibroids (largest is 7 cm) about a year ago.  She is here today for an embx.  She also tells me she has been having lots of gastric type pain, so much that she quit taking her iron pill.  Review of Systems She drinks at least 6 40ounce bottles of beer per week.  She denies a history of a DUI and doesn't see her alcohol use as concerning.  ( She declines any intervention.)    Objective:   Physical Exam A pap smear was obtained and her cervix was then prepped with betadine.  I was unable to pass either a sound or the pipelle past 4cm (still in endocervix).  A specimen was obtained and sent, but I feel that it was endocervix instead of endometrium.       Assessment & Plan:  DUB, fibroids, anemia- I will check an U/S. If her lining is very thin then I believe that the menorrhagia is caused by the fibroids. I will check TSH and cvx cultures. Gastritis- probably related to her etoh consumption.  I will check Cmeta.

## 2010-11-04 ENCOUNTER — Telehealth: Payer: Self-pay | Admitting: *Deleted

## 2010-11-04 NOTE — Telephone Encounter (Signed)
Pt calls c/o iron supplement causing gi upset, she states no matter what she does it causes her gi distress, with food, w/o food, at night, during day... No matter what, appt given as requested. 10/3 at 1515 per [place].

## 2010-11-08 ENCOUNTER — Ambulatory Visit (HOSPITAL_COMMUNITY)
Admission: RE | Admit: 2010-11-08 | Discharge: 2010-11-08 | Disposition: A | Discharge status: Home / Self Care | Payer: Self-pay | Source: Ambulatory Visit | Attending: Obstetrics & Gynecology | Admitting: Obstetrics & Gynecology

## 2010-11-08 DIAGNOSIS — N949 Unspecified condition associated with female genital organs and menstrual cycle: Secondary | ICD-10-CM | POA: Insufficient documentation

## 2010-11-08 DIAGNOSIS — N938 Other specified abnormal uterine and vaginal bleeding: Secondary | ICD-10-CM | POA: Insufficient documentation

## 2010-11-08 DIAGNOSIS — D259 Leiomyoma of uterus, unspecified: Secondary | ICD-10-CM | POA: Insufficient documentation

## 2010-11-09 ENCOUNTER — Encounter: Payer: Self-pay | Admitting: Internal Medicine

## 2010-11-30 ENCOUNTER — Ambulatory Visit: Payer: Self-pay | Admitting: Obstetrics & Gynecology

## 2010-12-15 ENCOUNTER — Ambulatory Visit: Payer: Self-pay | Admitting: Obstetrics & Gynecology

## 2011-05-17 ENCOUNTER — Emergency Department (HOSPITAL_COMMUNITY)
Admission: EM | Admit: 2011-05-17 | Discharge: 2011-05-17 | Disposition: A | Discharge status: Home / Self Care | Payer: Self-pay | Source: Home / Self Care | Attending: Emergency Medicine | Admitting: Emergency Medicine

## 2013-08-17 ENCOUNTER — Encounter (HOSPITAL_COMMUNITY): Payer: Self-pay | Admitting: Emergency Medicine

## 2013-08-17 ENCOUNTER — Emergency Department (INDEPENDENT_AMBULATORY_CARE_PROVIDER_SITE_OTHER): Payer: Worker's Compensation

## 2013-08-17 ENCOUNTER — Emergency Department (INDEPENDENT_AMBULATORY_CARE_PROVIDER_SITE_OTHER)
Admission: EM | Admit: 2013-08-17 | Discharge: 2013-08-17 | Disposition: A | Discharge status: Home / Self Care | Payer: Worker's Compensation | Source: Home / Self Care | Attending: Family Medicine | Admitting: Family Medicine

## 2013-08-17 DIAGNOSIS — X58XXXA Exposure to other specified factors, initial encounter: Secondary | ICD-10-CM | POA: Diagnosis not present

## 2013-08-17 DIAGNOSIS — S40021A Contusion of right upper arm, initial encounter: Secondary | ICD-10-CM

## 2013-08-17 DIAGNOSIS — S40029A Contusion of unspecified upper arm, initial encounter: Secondary | ICD-10-CM

## 2013-08-17 MED ORDER — DICLOFENAC POTASSIUM 50 MG PO TABS: 50.0000 mg | ORAL_TABLET | Freq: Three times a day (TID) | ORAL | Status: DC

## 2013-08-17 NOTE — ED Notes (Signed)
Reports being at work pushing a cart full of many trays, when the cart's wheel came off, causing the cart to start to fall; pt tried holding up cart with her RUE; c/o right forearm and shoulder pain without numbness or tingling.

## 2013-08-17 NOTE — ED Provider Notes (Signed)
CSN: [id number]     Arrival date & time 08/17/13  1519 History   First MD Initiated Contact with Patient 08/17/13 1535     Chief Complaint  Patient presents with  . Arm Injury   (Consider location/radiation/quality/duration/timing/severity/associated sxs/prior Treatment) Patient is a 53 y.o. female presenting with arm injury. The history is provided by the patient.  Arm Injury Location:  Shoulder and arm Time since incident:  2 hours Injury: yes   Mechanism of injury comment:  Food tray cart at work fell over causing injury to right arm. Shoulder location:  R shoulder Arm location:  R forearm Pain details:    Quality:  Sharp   Radiates to:  Does not radiate   Severity:  Mild   Onset quality:  Sudden Chronicity:  New Dislocation: no   Prior injury to area:  No Relieved by:  None tried Worsened by:  Nothing tried Associated symptoms: no back pain, no decreased range of motion, no muscle weakness, no numbness and no tingling   Risk factors: no known bone disorder     Past Medical History  Diagnosis Date  . Thrombocytopenia   . Alcohol abuse   . Tobacco abuse   . Uterine fibroid   . Iron deficiency anemia    Past Surgical History  Procedure Laterality Date  . Caesarean section      x3  . Cesarean section      3X   Family History  Problem Relation Age of Onset  . Fibroids Mother   . Fibroids Sister    History  Substance Use Topics  . Smoking status: Current Every Day Smoker    Types: Cigarettes  . Smokeless tobacco: Never Used  . Alcohol Use: Yes     Comment: daily   OB History   Grav Para Term Preterm Abortions TAB SAB Ect Mult Living   3 3 3  0 0 0 0 0 0 3     Review of Systems  Unable to perform ROS Constitutional: Negative.   HENT: Negative.   Respiratory: Negative for chest tightness and shortness of breath.   Cardiovascular: Negative for chest pain.  Gastrointestinal: Negative.   Genitourinary: Negative.   Musculoskeletal: Negative for back pain,  gait problem and joint swelling.  Skin: Negative.   Neurological: Negative.     Allergies  Review of patient's allergies indicates no known allergies.  Home Medications   Prior to Admission medications   Medication Sig Start Date End Date Taking? Authorizing Provider  diclofenac (CATAFLAM) 50 MG tablet Take 1 tablet (50 mg total) by mouth 3 (three) times daily. For arm pain 08/17/13   Billy Fischer, MD  ferrous sulfate 325 (65 FE) MG tablet Take 325 mg by mouth 3 (three) times daily with meals.      Historical Provider, MD   BP 123/74  Pulse 76  Temp(Src) 97.8 F (36.6 C) (Oral)  Resp 16  SpO2 99%  LMP 09/17/2010 Physical Exam  Nursing note and vitals reviewed. Constitutional: She is oriented to person, place, and time. She appears well-developed and well-nourished.  Musculoskeletal: She exhibits tenderness.       Right shoulder: She exhibits tenderness. She exhibits normal range of motion, no bony tenderness, no swelling, no deformity, no spasm, normal pulse and normal strength.       Right forearm: She exhibits tenderness. She exhibits no bony tenderness, no swelling and no deformity.  Neurological: She is alert and oriented to person, place, and time.  Skin: Skin  is warm and dry.    ED Course  Procedures (including critical care time) Labs Review Labs Reviewed - No data to display  Imaging Review Dg Shoulder Right  08/17/2013   CLINICAL DATA:  Injury.  Right shoulder pain.  EXAM: RIGHT SHOULDER - 2+ VIEW  COMPARISON:  None.  FINDINGS: There is no acute bony or joint abnormality. Acromioclavicular degenerative change is noted. Imaged right lung and ribs appear normal.  IMPRESSION: No acute finding.  Acromioclavicular osteoarthritis.   Electronically Signed   By: Inge Rise M.D.   On: 08/17/2013 15:57   Dg Forearm Right  08/17/2013   CLINICAL DATA:  Injury, right forearm pain.  EXAM: RIGHT FOREARM - 2 VIEW  COMPARISON:  None.  FINDINGS: Imaged bones, joints and soft  tissues appear normal.  IMPRESSION: Negative exam.   Electronically Signed   By: Inge Rise M.D.   On: 08/17/2013 15:55     MDM   1. Contusion of arm, multiple sites, right, initial encounter        Billy Fischer, MD 08/17/13 1643

## 2013-08-17 NOTE — Discharge Instructions (Signed)
Ice, sling and medicine as needed for arm pain , ok to work as scheduled, see your doctor if further problems.

## 2013-08-18 NOTE — ED Notes (Signed)
Call from employer HR w questions regarding patient RTW status. Copies of d/c instructions w note including restrictions faxed to office

## 2013-12-08 ENCOUNTER — Encounter (HOSPITAL_COMMUNITY): Payer: Self-pay | Admitting: Emergency Medicine

## 2014-04-15 ENCOUNTER — Encounter (HOSPITAL_COMMUNITY): Payer: Self-pay | Admitting: Emergency Medicine

## 2014-04-15 ENCOUNTER — Emergency Department (HOSPITAL_COMMUNITY)
Admission: EM | Admit: 2014-04-15 | Discharge: 2014-04-15 | Disposition: A | Discharge status: Home / Self Care | Payer: Self-pay | Attending: Emergency Medicine | Admitting: Emergency Medicine

## 2014-04-15 DIAGNOSIS — R269 Unspecified abnormalities of gait and mobility: Secondary | ICD-10-CM | POA: Insufficient documentation

## 2014-04-15 DIAGNOSIS — Z8742 Personal history of other diseases of the female genital tract: Secondary | ICD-10-CM | POA: Insufficient documentation

## 2014-04-15 DIAGNOSIS — Z720 Tobacco use: Secondary | ICD-10-CM | POA: Insufficient documentation

## 2014-04-15 DIAGNOSIS — Z79899 Other long term (current) drug therapy: Secondary | ICD-10-CM | POA: Insufficient documentation

## 2014-04-15 DIAGNOSIS — Z791 Long term (current) use of non-steroidal anti-inflammatories (NSAID): Secondary | ICD-10-CM | POA: Insufficient documentation

## 2014-04-15 DIAGNOSIS — M79672 Pain in left foot: Secondary | ICD-10-CM | POA: Insufficient documentation

## 2014-04-15 DIAGNOSIS — Z862 Personal history of diseases of the blood and blood-forming organs and certain disorders involving the immune mechanism: Secondary | ICD-10-CM | POA: Insufficient documentation

## 2014-04-15 MED ORDER — IBUPROFEN 200 MG PO TABS: 800.0000 mg | ORAL_TABLET | Freq: Four times a day (QID) | ORAL | Status: DC | PRN

## 2014-04-15 MED ORDER — LIDOCAINE HCL (PF) 1 % IJ SOLN
5.0000 mL | Freq: Once | INTRAMUSCULAR | Status: AC
  Administered 2014-04-15: 5 mL via INTRADERMAL
  Filled 2014-04-15: qty 5

## 2014-04-15 NOTE — ED Notes (Signed)
Pt declined tx of lesion of foot. Lidocaine not used.

## 2014-04-15 NOTE — ED Provider Notes (Signed)
Patient seen and examined by me. Patient states she has had left foot pain for several months. States the skin on the bottom of her foot is hard and it's painful to step on her heel. She states she wears good supportive sneakers she denies any injuries. Has never seen anyone for similar complaints. Patient also concerned about a hard nodule to the MTP joint of her left dorsal great toe states it has been there since she was 54 years old. States sometimes she picks at it with a needle, but has never seen anyone for it.  On exam, tenderness to palpation over left heel. Her skin is thickened, callused. No evidence of infection. Full range of motion of the ankle, foot, all toes. Patient has a 1 cm raised, keratotic nodule to the dorsum of the first MTP joint of the left foot. It is nontender. No drainage. No erythema.  Patient exam is consistent with plantar fasciitis versus tender calluses to the left foot and skin growth to the dorsal first MTP joint. Will refer to podiatry for further treatment. Advised to wear good orthotics, NSAIDs, ice massage.  Filed Vitals:   04/15/14 1110  BP: 166/85  Pulse: 63  Temp: 98.3 F (36.8 C)  TempSrc: Oral  Resp: 20  SpO2: 98%     Jeannett Senior, PA-C 04/15/14 1135

## 2014-04-15 NOTE — Discharge Instructions (Signed)
Follow up with podiatry.  Apply ice to the bottom of the foot.  Take ibuprofen for pain.

## 2014-04-15 NOTE — ED Notes (Signed)
Pt sts left foot pain on heel and "knot on toe" x months

## 2014-04-15 NOTE — ED Provider Notes (Signed)
CSN: [id number]     Arrival date & time 04/15/14  1048 History  This chart was scribed for non-physician practitioner, Ottie Glazier, PA-C working with Varney Biles, MD by Judithann Sauger, ED Scribe. The patient was seen in room TR07C/TR07C and the patient's care was started at 11:13 AM    Chief Complaint  Patient presents with  . Foot Pain    The history is provided by the patient. No language interpreter was used.   HPI Comments: Evelyn Crawford is a 54 y.o. female who presents to the Emergency Department complaining of left foot pain onset 2 and a half months. She explains that the heel pain is worse when she walks and when she takes her first step in the morning. She also reports of an associated pimple over her left big toe. She reports gait problems. She denies fever. She states that she works in [employer]. She denies a hx of abscess. She also denies any recent injury to that foot.    Past Medical History  Diagnosis Date  . Thrombocytopenia   . Alcohol abuse   . Tobacco abuse   . Uterine fibroid   . Iron deficiency anemia    Past Surgical History  Procedure Laterality Date  . Caesarean section      x3  . Cesarean section      3X   Family History  Problem Relation Age of Onset  . Fibroids Mother   . Fibroids Sister    History  Substance Use Topics  . Smoking status: Current Every Day Smoker    Types: Cigarettes  . Smokeless tobacco: Never Used  . Alcohol Use: Yes     Comment: daily   OB History    Gravida Para Term Preterm AB TAB SAB Ectopic Multiple Living   3 3 3  0 0 0 0 0 0 3     Review of Systems  Constitutional: Negative for fever.  Musculoskeletal: Positive for myalgias (left heel pain), arthralgias (left foot pain) and gait problem.      Allergies  Review of patient's allergies indicates no known allergies.  Home Medications   Prior to Admission medications   Medication Sig Start Date End Date Taking? Authorizing Provider  diclofenac  (CATAFLAM) 50 MG tablet Take 1 tablet (50 mg total) by mouth 3 (three) times daily. For arm pain 08/17/13   Billy Fischer, MD  ferrous sulfate 325 (65 FE) MG tablet Take 325 mg by mouth 3 (three) times daily with meals.      Historical Provider, MD   BP 166/85 mmHg  Pulse 63  Temp(Src) 98.3 F (36.8 C) (Oral)  Resp 20  SpO2 98%  LMP 09/17/2010 Physical Exam  Constitutional: She is oriented to person, place, and time. She appears well-developed and well-nourished. No distress.  HENT:  Head: Normocephalic and atraumatic.  Eyes: Conjunctivae and EOM are normal.  Neck: Neck supple. No tracheal deviation present.  Cardiovascular: Normal rate.   Pulmonary/Chest: Effort normal. No respiratory distress.  Musculoskeletal:       Feet:  There is a pimple over the left first metatarsal with some underlying scar tissue from previous episodes of draining and picking with a needle.   She has pain with palpation of the left heel.  No deformity.   Neurological: She is alert and oriented to person, place, and time.  Skin: Skin is warm and dry.  Psychiatric: She has a normal mood and affect. Her behavior is normal.  Nursing note and  vitals reviewed.   ED Course  Procedures (including critical care time) DIAGNOSTIC STUDIES: Oxygen Saturation is 98% on RA, normal by my interpretation.    COORDINATION OF CARE: 11:20 AM- Pt advised of plan for treatment and pt agrees.    Labs Review Labs Reviewed - No data to display  Imaging Review No results found.   EKG Interpretation None      MDM   Final diagnoses:  None   She reports that the pimple and hard underlying skin have been here since she was 54 yo and dropped a vacuum cleaner on the foot.  There is no fluctuance or puss pocket.  I will give her a podiatry referral for heel pain and the scar tissue. I do not see any reason to image the foot.  No injury and she is able to ambulate without difficulty. The nurse has given her a work note for  today.   I personally performed the services described in this documentation, which was scribed in my presence. The recorded information has been reviewed and is accurate.   Ottie Glazier, PA-C 04/15/14 [place], MD 04/17/14 [phone]

## 2015-01-08 ENCOUNTER — Emergency Department (HOSPITAL_COMMUNITY)
Admission: EM | Admit: 2015-01-08 | Discharge: 2015-01-08 | Disposition: A | Discharge status: Home / Self Care | Payer: Self-pay | Attending: Emergency Medicine | Admitting: Emergency Medicine

## 2015-01-08 ENCOUNTER — Encounter (HOSPITAL_COMMUNITY): Payer: Self-pay | Admitting: Emergency Medicine

## 2015-01-08 DIAGNOSIS — L03311 Cellulitis of abdominal wall: Secondary | ICD-10-CM | POA: Insufficient documentation

## 2015-01-08 DIAGNOSIS — D509 Iron deficiency anemia, unspecified: Secondary | ICD-10-CM | POA: Insufficient documentation

## 2015-01-08 DIAGNOSIS — F17210 Nicotine dependence, cigarettes, uncomplicated: Secondary | ICD-10-CM | POA: Insufficient documentation

## 2015-01-08 DIAGNOSIS — D696 Thrombocytopenia, unspecified: Secondary | ICD-10-CM | POA: Insufficient documentation

## 2015-01-08 DIAGNOSIS — Z86018 Personal history of other benign neoplasm: Secondary | ICD-10-CM | POA: Insufficient documentation

## 2015-01-08 DIAGNOSIS — Z79899 Other long term (current) drug therapy: Secondary | ICD-10-CM | POA: Insufficient documentation

## 2015-01-08 MED ORDER — MUPIROCIN CALCIUM 2 % EX CREA: 1.0000 "application " | TOPICAL_CREAM | Freq: Three times a day (TID) | CUTANEOUS | Status: DC

## 2015-01-08 MED ORDER — CLINDAMYCIN HCL 150 MG PO CAPS: 450.0000 mg | ORAL_CAPSULE | Freq: Three times a day (TID) | ORAL | Status: DC

## 2015-01-08 MED ORDER — MUPIROCIN CALCIUM 2 % EX CREA: 1.0000 "application " | TOPICAL_CREAM | Freq: Two times a day (BID) | CUTANEOUS | Status: DC

## 2015-01-08 NOTE — ED Notes (Signed)
Pt c/o wound drainage "at C-section site" - noticed drainage last night.

## 2015-01-08 NOTE — Discharge Instructions (Signed)

## 2015-01-08 NOTE — ED Provider Notes (Signed)
CSN: [id number]     Arrival date & time 01/08/15  [id number] History   First MD Initiated Contact with Patient 01/08/15 [phone]     Chief Complaint  Patient presents with  . Wound Infection     (Consider location/radiation/quality/duration/timing/severity/associated sxs/prior Treatment) HPI  54 year old female presents with drainage and redness around her C-section scar that started yesterday. Has some mild pain and irritation. Denies fevers, vomiting, or injury. Her C-section was over 20 years ago. Patient rates her pain as a 3/10. States the drainage has been brown because she has noticed it in her underwear.  Past Medical History  Diagnosis Date  . Thrombocytopenia ([place])   . Alcohol abuse   . Tobacco abuse   . Uterine fibroid   . Iron deficiency anemia    Past Surgical History  Procedure Laterality Date  . Caesarean section      x3  . Cesarean section      3X   Family History  Problem Relation Age of Onset  . Fibroids Mother   . Fibroids Sister    Social History  Substance Use Topics  . Smoking status: Current Every Day Smoker -- 0.03 packs/day    Types: Cigarettes  . Smokeless tobacco: Never Used  . Alcohol Use: Yes     Comment: daily 24 ounce beer every night   OB History    Gravida Para Term Preterm AB TAB SAB Ectopic Multiple Living   3 3 3  0 0 0 0 0 0 3     Review of Systems  Constitutional: Negative for fever.  Gastrointestinal: Negative for abdominal pain.  Skin: Positive for color change. Negative for wound.  All other systems reviewed and are negative.     Allergies  Review of patient's allergies indicates no known allergies.  Home Medications   Prior to Admission medications   Medication Sig Start Date End Date Taking? Authorizing Provider  clindamycin (CLEOCIN) 150 MG capsule Take 3 capsules (450 mg total) by mouth 3 (three) times daily. X 7 days 01/08/15   Sherwood Gambler, MD  diclofenac (CATAFLAM) 50 MG tablet Take 1 tablet (50 mg total) by mouth 3  (three) times daily. For arm pain Patient not taking: Reported on 01/08/2015 08/17/13   Billy Fischer, MD  ferrous sulfate 325 (65 FE) MG tablet Take 325 mg by mouth 3 (three) times daily with meals.      Historical Provider, MD  ibuprofen (MOTRIN IB) 200 MG tablet Take 4 tablets (800 mg total) by mouth every 6 (six) hours as needed. Patient not taking: Reported on 01/08/2015 04/15/14   Ottie Glazier, PA-C  mupirocin cream (BACTROBAN) 2 % Apply 1 application topically 3 (three) times daily. 01/08/15   Sherwood Gambler, MD   BP 164/97 mmHg  Pulse 77  Temp(Src) 98.1 F (36.7 C) (Oral)  Resp 17  Ht 5\' 6"  (1.676 m)  Wt 164 lb (74.39 kg)  BMI 26.48 kg/m2  SpO2 100%  LMP 09/17/2010 Physical Exam  Constitutional: She is oriented to person, place, and time. She appears well-developed and well-nourished.  HENT:  Head: Normocephalic and atraumatic.  Right Ear: External ear normal.  Left Ear: External ear normal.  Nose: Nose normal.  Eyes: Right eye exhibits no discharge. Left eye exhibits no discharge.  Cardiovascular: Normal rate, regular rhythm and normal heart sounds.   Pulmonary/Chest: Effort normal and breath sounds normal.  Abdominal: Soft. She exhibits no distension. There is no tenderness.    Neurological: She is alert and oriented  to person, place, and time.  Skin: Skin is warm and dry. There is erythema.  Nursing note and vitals reviewed.   ED Course  Procedures (including critical care time) Labs Review Labs Reviewed - No data to display  Imaging Review No results found. I have personally reviewed and evaluated these images and lab results as part of my medical decision-making.   EKG Interpretation None      MDM   Final diagnoses:  Cellulitis of abdominal wall    Patient with localized erythema to her C-section scar. The right side has bright red erythema on both sides. No drainage noted. No dehisced since. No fluctuance or induration. A bedside ultrasound by me  shows no obvious abscess. Her vital signs are unremarkable besides hypertension, no signs of systemic illness or sepsis. Plan to treat with both topical and oral antibiotics, referred to OB/GYN for wound check and further management. Discussed return precautions.    Sherwood Gambler, MD 01/08/15 [phone]

## 2015-01-23 ENCOUNTER — Emergency Department (HOSPITAL_COMMUNITY)
Admission: EM | Admit: 2015-01-23 | Discharge: 2015-01-23 | Disposition: A | Discharge status: Home / Self Care | Payer: Self-pay | Attending: Emergency Medicine | Admitting: Emergency Medicine

## 2015-01-23 ENCOUNTER — Encounter (HOSPITAL_COMMUNITY): Payer: Self-pay | Admitting: Emergency Medicine

## 2015-01-23 ENCOUNTER — Emergency Department (HOSPITAL_COMMUNITY): Payer: Self-pay

## 2015-01-23 DIAGNOSIS — F17210 Nicotine dependence, cigarettes, uncomplicated: Secondary | ICD-10-CM | POA: Insufficient documentation

## 2015-01-23 DIAGNOSIS — Z86018 Personal history of other benign neoplasm: Secondary | ICD-10-CM | POA: Insufficient documentation

## 2015-01-23 DIAGNOSIS — K2970 Gastritis, unspecified, without bleeding: Secondary | ICD-10-CM | POA: Insufficient documentation

## 2015-01-23 DIAGNOSIS — Z7982 Long term (current) use of aspirin: Secondary | ICD-10-CM | POA: Insufficient documentation

## 2015-01-23 DIAGNOSIS — D509 Iron deficiency anemia, unspecified: Secondary | ICD-10-CM | POA: Insufficient documentation

## 2015-01-23 DIAGNOSIS — R630 Anorexia: Secondary | ICD-10-CM | POA: Insufficient documentation

## 2015-01-23 DIAGNOSIS — Z79899 Other long term (current) drug therapy: Secondary | ICD-10-CM | POA: Insufficient documentation

## 2015-01-23 LAB — URINALYSIS, ROUTINE W REFLEX MICROSCOPIC
BILIRUBIN URINE: NEGATIVE
Glucose, UA: NEGATIVE mg/dL
Hgb urine dipstick: NEGATIVE
KETONES UR: NEGATIVE mg/dL
Leukocytes, UA: NEGATIVE
NITRITE: NEGATIVE
PROTEIN: NEGATIVE mg/dL
Specific Gravity, Urine: 1.022 (ref 1.005–1.030)
pH: 5 (ref 5.0–8.0)

## 2015-01-23 LAB — CBC
HCT: 41.3 % (ref 36.0–46.0)
Hemoglobin: 13.2 g/dL (ref 12.0–15.0)
MCH: 30.6 pg (ref 26.0–34.0)
MCHC: 32 g/dL (ref 30.0–36.0)
MCV: 95.6 fL (ref 78.0–100.0)
PLATELETS: 202 10*3/uL (ref 150–400)
RBC: 4.32 MIL/uL (ref 3.87–5.11)
RDW: 12.5 % (ref 11.5–15.5)
WBC: 9.1 10*3/uL (ref 4.0–10.5)

## 2015-01-23 LAB — COMPREHENSIVE METABOLIC PANEL
ALBUMIN: 4 g/dL (ref 3.5–5.0)
ALT: 27 U/L (ref 14–54)
AST: 30 U/L (ref 15–41)
Alkaline Phosphatase: 89 U/L (ref 38–126)
Anion gap: 7 (ref 5–15)
BILIRUBIN TOTAL: 0.5 mg/dL (ref 0.3–1.2)
BUN: 7 mg/dL (ref 6–20)
CALCIUM: 10 mg/dL (ref 8.9–10.3)
CO2: 25 mmol/L (ref 22–32)
Chloride: 106 mmol/L (ref 101–111)
Creatinine, Ser: 0.74 mg/dL (ref 0.44–1.00)
GFR calc Af Amer: >60 mL/min (ref >60)
GLUCOSE: 100 mg/dL — AB (ref 65–99)
Potassium: 3.7 mmol/L (ref 3.5–5.1)
Sodium: 138 mmol/L (ref 135–145)
TOTAL PROTEIN: 8 g/dL (ref 6.5–8.1)

## 2015-01-23 LAB — I-STAT CG4 LACTIC ACID, ED
LACTIC ACID, VENOUS: 1.31 mmol/L (ref 0.5–2.0)
Lactic Acid, Venous: 0.81 mmol/L (ref 0.5–2.0)

## 2015-01-23 LAB — I-STAT TROPONIN, ED: TROPONIN I, POC: 0 ng/mL (ref 0.00–0.08)

## 2015-01-23 LAB — LIPASE, BLOOD: LIPASE: 28 U/L (ref 11–51)

## 2015-01-23 MED ORDER — HYDROMORPHONE HCL 1 MG/ML IJ SOLN
1.0000 mg | Freq: Once | INTRAMUSCULAR | Status: AC
  Administered 2015-01-23: 1 mg via INTRAVENOUS
  Filled 2015-01-23: qty 1

## 2015-01-23 MED ORDER — HYDROMORPHONE HCL 1 MG/ML IJ SOLN
0.5000 mg | Freq: Once | INTRAMUSCULAR | Status: AC
  Administered 2015-01-23: 0.5 mg via INTRAVENOUS
  Filled 2015-01-23: qty 1

## 2015-01-23 MED ORDER — PANTOPRAZOLE SODIUM 40 MG IV SOLR
40.0000 mg | Freq: Once | INTRAVENOUS | Status: AC
  Administered 2015-01-23: 40 mg via INTRAVENOUS
  Filled 2015-01-23: qty 40

## 2015-01-23 MED ORDER — SODIUM CHLORIDE 0.9 % IV BOLUS (SEPSIS)
1000.0000 mL | Freq: Once | INTRAVENOUS | Status: AC
  Administered 2015-01-23: 1000 mL via INTRAVENOUS

## 2015-01-23 MED ORDER — GI COCKTAIL ~~LOC~~
30.0000 mL | Freq: Once | ORAL | Status: AC
  Administered 2015-01-23: 30 mL via ORAL
  Filled 2015-01-23: qty 30

## 2015-01-23 MED ORDER — IOHEXOL 300 MG/ML  SOLN
100.0000 mL | Freq: Once | INTRAMUSCULAR | Status: AC | PRN
  Administered 2015-01-23: 100 mL via INTRAVENOUS

## 2015-01-23 MED ORDER — FAMOTIDINE 20 MG PO TABS
20.0000 mg | ORAL_TABLET | Freq: Two times a day (BID) | ORAL | Status: DC
Start: 2015-01-23 — End: 2018-03-15

## 2015-01-23 NOTE — Discharge Instructions (Signed)
You were seen today for your abdominal pain.  This appears most likely to be secondary to inflammation in your stomach.  Do not drink alcohol, eat spicy foods, take Ibuprofen, take aspirin, or smoke as these can all make this pain worse.  Take the medications prescribed to help with the pain.  Follow up outpatient for continued evaluation.  Gastritis, Adult Gastritis is soreness and swelling (inflammation) of the lining of the stomach. Gastritis can develop as a sudden onset (acute) or long-term (chronic) condition. If gastritis is not treated, it can lead to stomach bleeding and ulcers. CAUSES  Gastritis occurs when the stomach lining is weak or damaged. Digestive juices from the stomach then inflame the weakened stomach lining. The stomach lining may be weak or damaged due to viral or bacterial infections. One common bacterial infection is the Helicobacter pylori infection. Gastritis can also result from excessive alcohol consumption, taking certain medicines, or having too much acid in the stomach.  SYMPTOMS  In some cases, there are no symptoms. When symptoms are present, they may include:  Pain or a burning sensation in the upper abdomen.  Nausea.  Vomiting.  An uncomfortable feeling of fullness after eating. DIAGNOSIS  Your caregiver may suspect you have gastritis based on your symptoms and a physical exam. To determine the cause of your gastritis, your caregiver may perform the following:  Blood or stool tests to check for the H pylori bacterium.  Gastroscopy. A thin, flexible tube (endoscope) is passed down the esophagus and into the stomach. The endoscope has a light and camera on the end. Your caregiver uses the endoscope to view the inside of the stomach.  Taking a tissue sample (biopsy) from the stomach to examine under a microscope. TREATMENT  Depending on the cause of your gastritis, medicines may be prescribed. If you have a bacterial infection, such as an H pylori infection,  antibiotics may be given. If your gastritis is caused by too much acid in the stomach, H2 blockers or antacids may be given. Your caregiver may recommend that you stop taking aspirin, ibuprofen, or other nonsteroidal anti-inflammatory drugs (NSAIDs). HOME CARE INSTRUCTIONS  Only take over-the-counter or prescription medicines as directed by your caregiver.  If you were given antibiotic medicines, take them as directed. Finish them even if you start to feel better.  Drink enough fluids to keep your urine clear or pale yellow.  Avoid foods and drinks that make your symptoms worse, such as:  Caffeine or alcoholic drinks.  Chocolate.  Peppermint or mint flavorings.  Garlic and onions.  Spicy foods.  Citrus fruits, such as oranges, lemons, or limes.  Tomato-based foods such as sauce, chili, salsa, and pizza.  Fried and fatty foods.  Eat small, frequent meals instead of large meals. SEEK IMMEDIATE MEDICAL CARE IF:   You have black or dark red stools.  You vomit blood or material that looks like coffee grounds.  You are unable to keep fluids down.  Your abdominal pain gets worse.  You have a fever.  You do not feel better after 1 week.  You have any other questions or concerns. MAKE SURE YOU:  Understand these instructions.  Will watch your condition.  Will get help right away if you are not doing well or get worse.   This information is not intended to replace advice given to you by your health care provider. Make sure you discuss any questions you have with your health care provider.   Document Released: 01/17/2001 Document Revised:  07/25/2011 Document Reviewed: 03/08/2011 Elsevier Interactive Patient Education [employer].

## 2015-01-23 NOTE — ED Notes (Signed)
Pt sts mid abd pain starting this am

## 2015-01-23 NOTE — ED Notes (Signed)
Pt taken to CT.

## 2015-01-23 NOTE — ED Notes (Signed)
PT reports last night she took an ASA and drank a beer and stomach hurt.

## 2015-01-23 NOTE — ED Notes (Signed)
Patient left at this time with all belongings. 

## 2015-01-23 NOTE — ED Notes (Signed)
Pt requesting pain medication, MD aware

## 2015-01-24 NOTE — ED Provider Notes (Signed)
CSN: [id number]     Arrival date & time 01/23/15  1655 History   First MD Initiated Contact with Patient 01/23/15 1832     Chief Complaint  Patient presents with  . Abdominal Pain     (Consider location/radiation/quality/duration/timing/severity/associated sxs/prior Treatment) HPI Comments: 54 y.o. Female with history of fibroids, anemia, alcohol abuse, tobacco abuse presents for abdominal pain.  The patient reports that last night she took an aspirin and drank a beer and that since that time she has had severe pain in her abdomen above her belly button.  She has felt nauseous but not vomited but has not been able to eat or drink anything.  No diarrhea or constipation.  She denies symptoms like this before.  Patient reports daily alcohol drinking.  She has not been taking ibuprofen and very seldom takes ASA.   Reports normal urinary patterns.  No chest pain, shortness of breath, cough.  Patient is a 54 y.o. female presenting with abdominal pain.  Abdominal Pain Associated symptoms: nausea   Associated symptoms: no chest pain, no chills, no constipation, no cough, no diarrhea, no dysuria, no fatigue, no fever, no shortness of breath and no vomiting     Past Medical History  Diagnosis Date  . Thrombocytopenia ([place])   . Alcohol abuse   . Tobacco abuse   . Uterine fibroid   . Iron deficiency anemia    Past Surgical History  Procedure Laterality Date  . Caesarean section      x3  . Cesarean section      3X   Family History  Problem Relation Age of Onset  . Fibroids Mother   . Fibroids Sister    Social History  Substance Use Topics  . Smoking status: Current Every Day Smoker -- 0.03 packs/day    Types: Cigarettes  . Smokeless tobacco: Never Used  . Alcohol Use: Yes     Comment: daily 24 ounce beer every night   OB History    Gravida Para Term Preterm AB TAB SAB Ectopic Multiple Living   3 3 3  0 0 0 0 0 0 3     Review of Systems  Constitutional: Positive for appetite  change. Negative for fever, chills and fatigue.  HENT: Negative for congestion, postnasal drip and rhinorrhea.   Eyes: Negative for pain and redness.  Respiratory: Negative for cough, chest tightness and shortness of breath.   Cardiovascular: Negative for chest pain and palpitations.  Gastrointestinal: Positive for nausea and abdominal pain. Negative for vomiting, diarrhea and constipation.  Genitourinary: Negative for dysuria, urgency and frequency.  Musculoskeletal: Negative for myalgias and back pain.  Skin: Negative for rash.  Neurological: Negative for weakness, light-headedness and headaches.  Hematological: Does not bruise/bleed easily.      Allergies  Review of patient's allergies indicates no known allergies.  Home Medications   Prior to Admission medications   Medication Sig Start Date End Date Taking? Authorizing Provider  aspirin 325 MG tablet Take 325 mg by mouth every 6 (six) hours as needed for moderate pain.   Yes Historical Provider, MD  clindamycin (CLEOCIN) 150 MG capsule Take 3 capsules (450 mg total) by mouth 3 (three) times daily. X 7 days Patient not taking: Reported on 01/23/2015 01/08/15   Sherwood Gambler, MD  diclofenac (CATAFLAM) 50 MG tablet Take 1 tablet (50 mg total) by mouth 3 (three) times daily. For arm pain Patient not taking: Reported on 01/08/2015 08/17/13   Billy Fischer, MD  famotidine (PEPCID) 20  MG tablet Take 1 tablet (20 mg total) by mouth 2 (two) times daily. 01/23/15   Harvel Quale, MD  ferrous sulfate 325 (65 FE) MG tablet Take 325 mg by mouth 3 (three) times daily with meals. Reported on 01/23/2015    Historical Provider, MD  ibuprofen (MOTRIN IB) 200 MG tablet Take 4 tablets (800 mg total) by mouth every 6 (six) hours as needed. Patient not taking: Reported on 01/08/2015 04/15/14   Hanna Patel-Mills, PA-C   BP 105/93 mmHg  Pulse 63  Temp(Src) 97.7 F (36.5 C) (Oral)  Resp 20  SpO2 97%  LMP 09/17/2010 Physical Exam  Constitutional:  She is oriented to person, place, and time. She appears well-developed and well-nourished. No distress.  HENT:  Head: Normocephalic and atraumatic.  Right Ear: External ear normal.  Left Ear: External ear normal.  Nose: Nose normal.  Mouth/Throat: Oropharynx is clear and moist. No oropharyngeal exudate.  Eyes: EOM are normal. Pupils are equal, round, and reactive to light.  Neck: Normal range of motion. Neck supple.  Cardiovascular: Normal rate, regular rhythm, normal heart sounds and intact distal pulses.   No murmur heard. Pulmonary/Chest: Effort normal. No respiratory distress. She has no wheezes. She has no rales.  Abdominal: Soft. She exhibits no distension. There is tenderness in the epigastric area and periumbilical area.  Musculoskeletal: Normal range of motion. She exhibits no edema or tenderness.  Neurological: She is alert and oriented to person, place, and time.  Skin: Skin is warm and dry. No rash noted. She is not diaphoretic.  Vitals reviewed.   ED Course  Procedures (including critical care time) Labs Review Labs Reviewed  COMPREHENSIVE METABOLIC PANEL - Abnormal; Notable for the following:    Glucose, Bld 100 (*)    All other components within normal limits  LIPASE, BLOOD  CBC  URINALYSIS, ROUTINE W REFLEX MICROSCOPIC (NOT AT [facility])  I-STAT CG4 LACTIC ACID, ED  I-STAT TROPOININ, ED  I-STAT CG4 LACTIC ACID, ED    Imaging Review Ct Abdomen Pelvis W Contrast  01/23/2015  CLINICAL DATA:  Periumbilical pain. Right upper quadrant pain radiating to the right lower quadrant. EXAM: CT ABDOMEN AND PELVIS WITH CONTRAST TECHNIQUE: Multidetector CT imaging of the abdomen and pelvis was performed using the standard protocol following bolus administration of intravenous contrast. CONTRAST:  134mL OMNIPAQUE IOHEXOL 300 MG/ML  SOLN COMPARISON:  No prior CT.  Pelvic ultrasound 11/08/2010 FINDINGS: Lower chest:  The included lung bases are clear. Liver: Mild diffuse decreased  density suggesting steatosis. Prominent size measuring 19.3 cm craniocaudal dimension. Small subcentimeter hypodensity within segment 6, not seen on delayed phase imaging, too small to characterize but may reflect hemangioma. Hepatobiliary: Gallbladder physiologically distended. No biliary dilatation. No calcified gallstone. Pancreas: Normal. Spleen: Normal in size and density. Adrenal glands: There is a 2.5 cm heterogeneous left adrenal nodule. Right adrenal gland is normal. Kidneys: Symmetric renal enhancement and excretion. No hydronephrosis. Stomach/Bowel: Stomach physiologically distended. There are no dilated or thickened small bowel loops. Small-moderate volume of stool throughout the colon without colonic wall thickening. The appendix is normal. Vascular/Lymphatic: No retroperitoneal adenopathy. Abdominal aorta is normal in caliber. Atherosclerosis of the distal abdominal aorta. Reproductive: Enlarged heterogeneous uterus with fibroids, some which are calcified and exophytic. The ovaries are difficult to discretely define given the fibroids. Bladder: Minimally distended, no definite wall thickening allowing for degree of distension. Other: No free air, free fluid, or intra-abdominal fluid collection. Musculoskeletal: There are no acute or suspicious osseous abnormalities. Hemi transitional  lumbosacral anatomy. IMPRESSION: 1. No definite acute abnormality in the abdomen/pelvis. 2. Left adrenal nodule measuring 2.5 cm. No prior imaging available for comparison. Recommend nonemergent adrenal protocol MRI. 3. Enlarged heterogeneous fibroid uterus. 4. Subcentimeter hypodensity in the right lobe of the liver, too small to characterize. This may reflect the hemangioma, however is incompletely characterized due to size. Electronically Signed   By: Jeb Levering M.D.   On: 01/23/2015 20:59   I have personally reviewed and evaluated these images and lab results as part of my medical decision-making.   EKG  Interpretation None      MDM  Patient was seen and evaluated in stable condition.  Labs were unremarkable.  No leukocytosis.  UA without infection.  CT without acute process.  Patient was given Protonix, dilaudid initially with little relief of symptoms.  GI cocktail given with some relief.  Patient non toxic appearing.  Discussed results with patient as well as family at bedside.  Discusse that this appears to most likely be gastritis.  Patient was prescribed famotidine and was discharged home in stable condition with instruction to follow up with the wellness clinic and with GI outpatient.  She was instructed to quit smoking and to refrain from alcohol, spicy food, coffee, ibuprofen, asa.  Patient was discharged home in stable condition. Final diagnoses:  Gastritis    1. Gastritis    Harvel Quale, MD 01/24/15 [phone]

## 2015-04-08 ENCOUNTER — Emergency Department (HOSPITAL_COMMUNITY)
Admission: EM | Admit: 2015-04-08 | Discharge: 2015-04-08 | Disposition: A | Discharge status: Home / Self Care | Payer: Self-pay | Attending: Emergency Medicine | Admitting: Emergency Medicine

## 2015-04-08 ENCOUNTER — Encounter (HOSPITAL_COMMUNITY): Payer: Self-pay

## 2015-04-08 DIAGNOSIS — R509 Fever, unspecified: Secondary | ICD-10-CM | POA: Insufficient documentation

## 2015-04-08 DIAGNOSIS — R6889 Other general symptoms and signs: Secondary | ICD-10-CM

## 2015-04-08 DIAGNOSIS — R079 Chest pain, unspecified: Secondary | ICD-10-CM | POA: Insufficient documentation

## 2015-04-08 DIAGNOSIS — Z86018 Personal history of other benign neoplasm: Secondary | ICD-10-CM | POA: Insufficient documentation

## 2015-04-08 DIAGNOSIS — D509 Iron deficiency anemia, unspecified: Secondary | ICD-10-CM | POA: Insufficient documentation

## 2015-04-08 DIAGNOSIS — R059 Cough, unspecified: Secondary | ICD-10-CM

## 2015-04-08 DIAGNOSIS — F17210 Nicotine dependence, cigarettes, uncomplicated: Secondary | ICD-10-CM | POA: Insufficient documentation

## 2015-04-08 DIAGNOSIS — R05 Cough: Secondary | ICD-10-CM | POA: Insufficient documentation

## 2015-04-08 MED ORDER — GUAIFENESIN-CODEINE 100-10 MG/5ML PO SOLN: 5.0000 mL | Freq: Three times a day (TID) | ORAL | Status: DC | PRN

## 2015-04-08 MED ORDER — HYPROMELLOSE (GONIOSCOPIC) 2.5 % OP SOLN
1.0000 [drp] | Freq: Three times a day (TID) | OPHTHALMIC | Status: DC
Start: 2015-04-08 — End: 2018-12-12

## 2015-04-08 MED ORDER — ALBUTEROL SULFATE HFA 108 (90 BASE) MCG/ACT IN AERS
2.0000 | INHALATION_SPRAY | Freq: Once | RESPIRATORY_TRACT | Status: AC
  Administered 2015-04-08: 2 via RESPIRATORY_TRACT
  Filled 2015-04-08: qty 6.7

## 2015-04-08 NOTE — ED Notes (Signed)
Patient here with cough, congestion and fever x 2 days, no distress

## 2015-04-08 NOTE — ED Provider Notes (Signed)
CSN: [id number]     Arrival date & time 04/08/15  [id number] History  By signing my name below, I, Evelyn Crawford, attest that this documentation has been prepared under the direction and in the presence of non-physician practitioner, Evelyn Grana, PA-C. Electronically Signed: Evelene Crawford, Scribe. 04/08/2015. 10:45 AM.     Chief Complaint  Patient presents with  . Cough  . Fever   The history is provided by the patient. No language interpreter was used.     HPI Comments:  Evelyn Crawford is a 55 y.o. female who presents to the Emergency Department complaining of generalized body aches  x 2 days. She reports associated dry cough, CP secondary to cough and subjective fever. Pt also notes she was around her grandchildren who were recently diagnosed with the flu. Pt did not receive the flu shot this season. No alleviating factors noted. She denies congestion, nausea, vomiting, diarrhea, and SOB. Pt smokes ~ 5 cigarettes a day.   Pt also complains of chronic eye rednesss.She denies pain or recent changes. She has been using OTC drops without relief.   No PCP  Past Medical History  Diagnosis Date  . Thrombocytopenia ([place])   . Alcohol abuse   . Tobacco abuse   . Uterine fibroid   . Iron deficiency anemia    Past Surgical History  Procedure Laterality Date  . Caesarean section      x3  . Cesarean section      3X   Family History  Problem Relation Age of Onset  . Fibroids Mother   . Fibroids Sister    Social History  Substance Use Topics  . Smoking status: Current Every Day Smoker -- 0.03 packs/day    Types: Cigarettes  . Smokeless tobacco: Never Used  . Alcohol Use: Yes     Comment: daily 24 ounce beer every night   OB History    Gravida Para Term Preterm AB TAB SAB Ectopic Multiple Living   3 3 3  0 0 0 0 0 0 3     Review of Systems  Constitutional: Positive for fever (subjective ).  HENT: Negative for congestion.   Eyes: Positive for redness. Negative for pain and visual  disturbance.  Respiratory: Positive for cough.   Cardiovascular: Positive for chest pain.  Gastrointestinal: Negative for nausea, vomiting and diarrhea.  Musculoskeletal: Positive for myalgias (generalized).  All other systems reviewed and are negative.   Allergies  Review of patient's allergies indicates no known allergies.  Home Medications   Prior to Admission medications   Medication Sig Start Date End Date Taking? Authorizing Provider  aspirin 325 MG tablet Take 325 mg by mouth every 6 (six) hours as needed for moderate pain.    Historical Provider, MD  clindamycin (CLEOCIN) 150 MG capsule Take 3 capsules (450 mg total) by mouth 3 (three) times daily. X 7 days Patient not taking: Reported on 01/23/2015 01/08/15   Evelyn Gambler, MD  diclofenac (CATAFLAM) 50 MG tablet Take 1 tablet (50 mg total) by mouth 3 (three) times daily. For arm pain Patient not taking: Reported on 01/08/2015 08/17/13   Evelyn Fischer, MD  famotidine (PEPCID) 20 MG tablet Take 1 tablet (20 mg total) by mouth 2 (two) times daily. 01/23/15   Evelyn Quale, MD  ferrous sulfate 325 (65 FE) MG tablet Take 325 mg by mouth 3 (three) times daily with meals. Reported on 01/23/2015    Historical Provider, MD  guaiFENesin-codeine 100-10 MG/5ML syrup Take 5 mLs  by mouth 3 (three) times daily as needed for cough. 04/08/15   Evelyn Grana, PA-C  ibuprofen (MOTRIN IB) 200 MG tablet Take 4 tablets (800 mg total) by mouth every 6 (six) hours as needed. Patient not taking: Reported on 01/08/2015 04/15/14   Evelyn Patel-Mills, PA-C   BP 124/90 mmHg  Pulse 80  Temp(Src) 99.2 F (37.3 C) (Oral)  Resp 16  Ht 5\' 6"  (1.676 m)  Wt 156 lb (70.761 kg)  BMI 25.19 kg/m2  SpO2 96%  LMP 09/17/2010 Physical Exam  Constitutional: She is oriented to person, place, and time. She appears well-developed and well-nourished. No distress.  HENT:  Head: Normocephalic and atraumatic.  Right Ear: External ear normal.  Left Ear: External ear normal.   Nose: Nose normal.  Mouth/Throat: Oropharynx is clear and moist. No oropharyngeal exudate.  Eyes: Conjunctivae and EOM are normal. Pupils are equal, round, and reactive to light. Right eye exhibits no discharge. Left eye exhibits no discharge. No scleral icterus.  Neck: Normal range of motion. Neck supple. No JVD present. No tracheal deviation present. No thyromegaly present.  Cardiovascular: Normal rate, regular rhythm, normal heart sounds and intact distal pulses.  Exam reveals no gallop and no friction rub.   No murmur heard. Pulmonary/Chest: Effort normal and breath sounds normal. No stridor. No respiratory distress. She has no wheezes. She has no rales. She exhibits tenderness.  Abdominal: Soft. Bowel sounds are normal. She exhibits no distension and no mass. There is no tenderness. There is no rebound and no guarding.  Musculoskeletal: Normal range of motion. She exhibits no edema or tenderness.  Lymphadenopathy:    She has no cervical adenopathy.  Neurological: She is alert and oriented to person, place, and time. She has normal reflexes. No cranial nerve deficit. She exhibits normal muscle tone. Coordination normal.  Skin: Skin is warm and dry. No rash noted. She is not diaphoretic. No erythema. No pallor.  Psychiatric: She has a normal mood and affect. Her behavior is normal. Judgment and thought content normal.  Nursing note and vitals reviewed.   ED Course  Procedures  DIAGNOSTIC STUDIES:  Oxygen Saturation is 96% on RA, normal by my interpretation.    COORDINATION OF CARE:  10:35 AM Will discharge with cough meds and PCP referral. Discussed treatment plan with pt at bedside and pt agreed to plan.   MDM   Pt with body aches and non-productive cough,multiple sick contacts Pt is well appearing, no respiratory distress.  She has mild chest wall tenderness to palpation.  Appears consistent with URI/cough.  Pt given albuterol inhaler in the ER and cough syrup at discharge. She  also requested eye drops for chronic red eyes. She was discharged in good condition, VSS.  Final diagnoses:  Flu-like symptoms  Cough    I personally performed the services described in this documentation, which was scribed in my presence. The recorded information has been reviewed and is accurate.      Evelyn Grana, PA-C 04/16/15 [place], MD 04/17/15 [phone]

## 2015-04-08 NOTE — ED Notes (Signed)
Declined W/C at D/C and was escorted to lobby by RN. 

## 2015-04-08 NOTE — Discharge Instructions (Signed)
Use albuterol inhaler every 4-6 hours as needed for shortness of breath, chest tightness or wheeze.  You do not need to use it if you do not have any of these symptoms. Take over-the-counter Tylenol, 1000 mg every 6 hours, do not exceed 4000 mg in 24 hours. You can take over-the-counter ibuprofen 400-800 mg every 8 hours as needed for fever and pain.   Upper Respiratory Infection, Adult  Most upper respiratory infections (URIs) are a viral infection of the air passages leading to the lungs. A URI affects the nose, throat, and upper air passages. The most common type of URI is nasopharyngitis and is typically referred to as "the common cold." URIs run their course and usually go away on their own. Most of the time, a URI does not require medical attention, but sometimes a bacterial infection in the upper airways can follow a viral infection. This is called a secondary infection. Sinus and middle ear infections are common types of secondary upper respiratory infections. Bacterial pneumonia can also complicate a URI. A URI can worsen asthma and chronic obstructive pulmonary disease (COPD). Sometimes, these complications can require emergency medical care and may be life threatening.  CAUSES Almost all URIs are caused by viruses. A virus is a type of germ and can spread from one person to another.  RISKS FACTORS You may be at risk for a URI if:   You smoke.   You have chronic heart or lung disease.  You have a weakened defense (immune) system.   You are very young or very old.   You have nasal allergies or asthma.  You work in crowded or poorly ventilated areas.  You work in health care facilities or schools. SIGNS AND SYMPTOMS  Symptoms typically develop 2-3 days after you come in contact with a cold virus. Most viral URIs last 7-10 days. However, viral URIs from the influenza virus (flu virus) can last 14-18 days and are typically more severe. Symptoms may include:   Runny or stuffy  (congested) nose.   Sneezing.   Cough.   Sore throat.   Headache.   Fatigue.   Fever.   Loss of appetite.   Pain in your forehead, behind your eyes, and over your cheekbones (sinus pain).  Muscle aches.  DIAGNOSIS  Your health care provider may diagnose a URI by:  Physical exam.  Tests to check that your symptoms are not due to another condition such as:  Strep throat.  Sinusitis.  Pneumonia.  Asthma. TREATMENT  A URI goes away on its own with time. It cannot be cured with medicines, but medicines may be prescribed or recommended to relieve symptoms. Medicines may help:  Reduce your fever.  Reduce your cough.  Relieve nasal congestion. HOME CARE INSTRUCTIONS   Take medicines only as directed by your health care provider.   Gargle warm saltwater or take cough drops to comfort your throat as directed by your health care provider.  Use a warm mist humidifier or inhale steam from a shower to increase air moisture. This may make it easier to breathe.  Drink enough fluid to keep your urine clear or pale yellow.   Eat soups and other clear broths and maintain good nutrition.   Rest as needed.   Return to work when your temperature has returned to normal or as your health care provider advises. You may need to stay home longer to avoid infecting others. You can also use a face mask and careful hand washing to prevent  spread of the virus.  Increase the usage of your inhaler if you have asthma.   Do not use any tobacco products, including cigarettes, chewing tobacco, or electronic cigarettes. If you need help quitting, ask your health care provider. PREVENTION  The best way to protect yourself from getting a cold is to practice good hygiene.   Avoid oral or hand contact with people with cold symptoms.   Wash your hands often if contact occurs.  There is no clear evidence that vitamin C, vitamin E, echinacea, or exercise reduces the chance of  developing a cold. However, it is always recommended to get plenty of rest, exercise, and practice good nutrition.  SEEK MEDICAL CARE IF:   You are getting worse rather than better.   Your symptoms are not controlled by medicine.   You have chills.  You have worsening shortness of breath.  You have brown or red mucus.  You have yellow or brown nasal discharge.  You have pain in your face, especially when you bend forward.  You have a fever.  You have swollen neck glands.  You have pain while swallowing.  You have white areas in the back of your throat. SEEK IMMEDIATE MEDICAL CARE IF:   You have severe or persistent:  Headache.  Ear pain.  Sinus pain.  Chest pain.  You have chronic lung disease and any of the following:  Wheezing.  Prolonged cough.  Coughing up blood.  A change in your usual mucus.  You have a stiff neck.  You have changes in your:  Vision.  Hearing.  Thinking.  Mood. MAKE SURE YOU:   Understand these instructions.  Will watch your condition.  Will get help right away if you are not doing well or get worse.   This information is not intended to replace advice given to you by your health care provider. Make sure you discuss any questions you have with your health care provider.   Document Released: 07/19/2000 Document Revised: 06/09/2014 Document Reviewed: 04/30/2013 Elsevier Interactive Patient Education 2016 [employer]   Influenza, Adult Influenza ("the flu") is a viral infection of the respiratory tract. It occurs more often in winter months because people spend more time in close contact with one another. Influenza can make you feel very sick. Influenza easily spreads from person to person (contagious). CAUSES  Influenza is caused by a virus that infects the respiratory tract. You can catch the virus by breathing in droplets from an infected person's cough or sneeze. You can also catch the virus by touching something  that was recently contaminated with the virus and then touching your mouth, nose, or eyes. RISKS AND COMPLICATIONS You may be at risk for a more severe case of influenza if you smoke cigarettes, have diabetes, have chronic heart disease (such as heart failure) or lung disease (such as asthma), or if you have a weakened immune system. Elderly people and pregnant women are also at risk for more serious infections. The most common problem of influenza is a lung infection (pneumonia). Sometimes, this problem can require emergency medical care and may be life threatening. SIGNS AND SYMPTOMS  Symptoms typically last 4 to 10 days and may include:  Fever.  Chills.  Headache, body aches, and muscle aches.  Sore throat.  Chest discomfort and cough.  Poor appetite.  Weakness or feeling tired.  Dizziness.  Nausea or vomiting. DIAGNOSIS  Diagnosis of influenza is often made based on your history and a physical exam. A nose or  throat swab test can be done to confirm the diagnosis. TREATMENT  In mild cases, influenza goes away on its own. Treatment is directed at relieving symptoms. For more severe cases, your health care provider may prescribe antiviral medicines to shorten the sickness. Antibiotic medicines are not effective because the infection is caused by a virus, not by bacteria. HOME CARE INSTRUCTIONS  Take medicines only as directed by your health care provider.  Use a cool mist humidifier to make breathing easier.  Get plenty of rest until your temperature returns to normal. This usually takes 3 to 4 days.  Drink enough fluid to keep your urine clear or pale yellow.  Cover yourmouth and nosewhen coughing or sneezing,and wash your handswellto prevent thevirusfrom spreading.  Stay homefromwork orschool untilthe fever is gonefor at least 9full day. PREVENTION  An annual influenza vaccination (flu shot) is the best way to avoid getting influenza. An annual flu shot is now  routinely recommended for all adults in the [place] IF:  You experiencechest pain, yourcough worsens,or you producemore mucus.  Youhave nausea,vomiting, ordiarrhea.  Your fever returns or gets worse. SEEK IMMEDIATE MEDICAL CARE IF:  You havetrouble breathing, you become short of breath,or your skin ornails becomebluish.  You have severe painor stiffnessin the neck.  You develop a sudden headache, or pain in the face or ear.  You have nausea or vomiting that you cannot control. MAKE SURE YOU:   Understand these instructions.  Will watch your condition.  Will get help right away if you are not doing well or get worse.   This information is not intended to replace advice given to you by your health care provider. Make sure you discuss any questions you have with your health care provider.   Document Released: 01/21/2000 Document Revised: 02/13/2014 Document Reviewed: 04/24/2011 Elsevier Interactive Patient Education 2016 [employer]  Cough, Adult Coughing is a reflex that clears your throat and your airways. Coughing helps to heal and protect your lungs. It is normal to cough occasionally, but a cough that happens with other symptoms or lasts a long time may be a sign of a condition that needs treatment. A cough may last only 2-3 weeks (acute), or it may last longer than 8 weeks (chronic). CAUSES Coughing is commonly caused by:  Breathing in substances that irritate your lungs.  A viral or bacterial respiratory infection.  Allergies.  Asthma.  Postnasal drip.  Smoking.  Acid backing up from the stomach into the esophagus (gastroesophageal reflux).  Certain medicines.  Chronic lung problems, including COPD (or rarely, lung cancer).  Other medical conditions such as heart failure. HOME CARE INSTRUCTIONS  Pay attention to any changes in your symptoms. Take these actions to help with your discomfort:  Take medicines only as told by your  health care provider.  If you were prescribed an antibiotic medicine, take it as told by your health care provider. Do not stop taking the antibiotic even if you start to feel better.  Talk with your health care provider before you take a cough suppressant medicine.  Drink enough fluid to keep your urine clear or pale yellow.  If the air is dry, use a cold steam vaporizer or humidifier in your bedroom or your home to help loosen secretions.  Avoid anything that causes you to cough at work or at home.  If your cough is worse at night, try sleeping in a semi-upright position.  Avoid cigarette smoke. If you smoke, quit smoking. If you  need help quitting, ask your health care provider.  Avoid caffeine.  Avoid alcohol.  Rest as needed. SEEK MEDICAL CARE IF:   You have new symptoms.  You cough up pus.  Your cough does not get better after 2-3 weeks, or your cough gets worse.  You cannot control your cough with suppressant medicines and you are losing sleep.  You develop pain that is getting worse or pain that is not controlled with pain medicines.  You have a fever.  You have unexplained weight loss.  You have night sweats. SEEK IMMEDIATE MEDICAL CARE IF:  You cough up blood.  You have difficulty breathing.  Your heartbeat is very fast.   This information is not intended to replace advice given to you by your health care provider. Make sure you discuss any questions you have with your health care provider.   Document Released: 07/22/2010 Document Revised: 10/14/2014 Document Reviewed: 04/01/2014 Elsevier Interactive Patient Education [employer].

## 2015-07-28 ENCOUNTER — Encounter (HOSPITAL_COMMUNITY): Payer: Self-pay

## 2015-07-28 ENCOUNTER — Other Ambulatory Visit: Payer: Self-pay

## 2015-07-28 ENCOUNTER — Emergency Department (HOSPITAL_COMMUNITY): Payer: Self-pay

## 2015-07-28 ENCOUNTER — Emergency Department (HOSPITAL_COMMUNITY)
Admission: EM | Admit: 2015-07-28 | Discharge: 2015-07-28 | Disposition: A | Discharge status: Home / Self Care | Payer: Self-pay | Attending: Emergency Medicine | Admitting: Emergency Medicine

## 2015-07-28 DIAGNOSIS — R079 Chest pain, unspecified: Secondary | ICD-10-CM | POA: Insufficient documentation

## 2015-07-28 DIAGNOSIS — Z7982 Long term (current) use of aspirin: Secondary | ICD-10-CM | POA: Insufficient documentation

## 2015-07-28 DIAGNOSIS — Z79899 Other long term (current) drug therapy: Secondary | ICD-10-CM | POA: Insufficient documentation

## 2015-07-28 DIAGNOSIS — F17210 Nicotine dependence, cigarettes, uncomplicated: Secondary | ICD-10-CM | POA: Insufficient documentation

## 2015-07-28 DIAGNOSIS — R519 Headache, unspecified: Secondary | ICD-10-CM

## 2015-07-28 DIAGNOSIS — R51 Headache: Secondary | ICD-10-CM | POA: Insufficient documentation

## 2015-07-28 LAB — BASIC METABOLIC PANEL
Anion gap: 6 (ref 5–15)
BUN: 9 mg/dL (ref 6–20)
CHLORIDE: 108 mmol/L (ref 101–111)
CO2: 24 mmol/L (ref 22–32)
CREATININE: 0.77 mg/dL (ref 0.44–1.00)
Calcium: 10 mg/dL (ref 8.9–10.3)
GFR calc Af Amer: >60 mL/min (ref >60)
GLUCOSE: 123 mg/dL — AB (ref 65–99)
POTASSIUM: 3.6 mmol/L (ref 3.5–5.1)
Sodium: 138 mmol/L (ref 135–145)

## 2015-07-28 LAB — HCG, QUANTITATIVE, PREGNANCY: hCG, Beta Chain, Quant, S: 8 m[IU]/mL — ABNORMAL HIGH (ref <5)

## 2015-07-28 LAB — I-STAT TROPONIN, ED: Troponin i, poc: 0 ng/mL (ref 0.00–0.08)

## 2015-07-28 LAB — CBC
HEMATOCRIT: 41.5 % (ref 36.0–46.0)
Hemoglobin: 13.5 g/dL (ref 12.0–15.0)
MCH: 30.5 pg (ref 26.0–34.0)
MCHC: 32.5 g/dL (ref 30.0–36.0)
MCV: 93.9 fL (ref 78.0–100.0)
PLATELETS: 138 10*3/uL — AB (ref 150–400)
RBC: 4.42 MIL/uL (ref 3.87–5.11)
RDW: 12.5 % (ref 11.5–15.5)
WBC: 6.2 10*3/uL (ref 4.0–10.5)

## 2015-07-28 LAB — I-STAT BETA HCG BLOOD, ED (MC, WL, AP ONLY): I-stat hCG, quantitative: 6.3 m[IU]/mL — ABNORMAL HIGH (ref <5)

## 2015-07-28 LAB — RAPID STREP SCREEN (MED CTR MEBANE ONLY): Streptococcus, Group A Screen (Direct): NEGATIVE

## 2015-07-28 MED ORDER — SODIUM CHLORIDE 0.9 % IV BOLUS (SEPSIS)
1000.0000 mL | Freq: Once | INTRAVENOUS | Status: AC
Start: 2015-07-28 — End: 2015-07-28
  Administered 2015-07-28: 1000 mL via INTRAVENOUS

## 2015-07-28 MED ORDER — DIPHENHYDRAMINE HCL 50 MG/ML IJ SOLN
25.0000 mg | Freq: Once | INTRAMUSCULAR | Status: AC
  Administered 2015-07-28: 25 mg via INTRAVENOUS
  Filled 2015-07-28: qty 1

## 2015-07-28 MED ORDER — VALPROATE SODIUM 500 MG/5ML IV SOLN
500.0000 mg | Freq: Once | INTRAVENOUS | Status: AC
  Administered 2015-07-28: 500 mg via INTRAVENOUS
  Filled 2015-07-28: qty 5

## 2015-07-28 MED ORDER — KETOROLAC TROMETHAMINE 30 MG/ML IJ SOLN
30.0000 mg | Freq: Once | INTRAMUSCULAR | Status: AC
  Administered 2015-07-28: 30 mg via INTRAVENOUS
  Filled 2015-07-28: qty 1

## 2015-07-28 MED ORDER — METOCLOPRAMIDE HCL 5 MG/ML IJ SOLN
10.0000 mg | Freq: Once | INTRAMUSCULAR | Status: AC
  Administered 2015-07-28: 10 mg via INTRAVENOUS
  Filled 2015-07-28: qty 2

## 2015-07-28 MED ORDER — NAPROXEN 500 MG PO TABS: 500.0000 mg | ORAL_TABLET | Freq: Two times a day (BID) | ORAL | Status: DC

## 2015-07-28 NOTE — Discharge Instructions (Signed)
Medications: Naprosyn  Treatment: Take Naprosyn twice daily for 1 week. Try to decrease the amount of straining you are doing when reading until you have been examined by the eye doctor.  Follow-up: Please follow-up with Dr. Posey Pronto for an eye exam, as I believe you may need glasses. Please return to the emergency department if you develop any new or worsening symptoms, such as chest pain, shortness of breath, worsening headache or any other new or concerning symptoms.   Nonspecific Chest Pain  Chest pain can be caused by many different conditions. There is always a chance that your pain could be related to something serious, such as a heart attack or a blood clot in your lungs. Chest pain can also be caused by conditions that are not life-threatening. If you have chest pain, it is very important to follow up with your health care provider. CAUSES  Chest pain can be caused by:  Heartburn.  Pneumonia or bronchitis.  Anxiety or stress.  Inflammation around your heart (pericarditis) or lung (pleuritis or pleurisy).  A blood clot in your lung.  A collapsed lung (pneumothorax). It can develop suddenly on its own (spontaneous pneumothorax) or from trauma to the chest.  Shingles infection (varicella-zoster virus).  Heart attack.  Damage to the bones, muscles, and cartilage that make up your chest wall. This can include:  Bruised bones due to injury.  Strained muscles or cartilage due to frequent or repeated coughing or overwork.  Fracture to one or more ribs.  Sore cartilage due to inflammation (costochondritis). RISK FACTORS  Risk factors for chest pain may include:  Activities that increase your risk for trauma or injury to your chest.  Respiratory infections or conditions that cause frequent coughing.  Medical conditions or overeating that can cause heartburn.  Heart disease or family history of heart disease.  Conditions or health behaviors that increase your risk of  developing a blood clot.  Having had chicken pox (varicella zoster). SIGNS AND SYMPTOMS Chest pain can feel like:  Burning or tingling on the surface of your chest or deep in your chest.  Crushing, pressure, aching, or squeezing pain.  Dull or sharp pain that is worse when you move, cough, or take a deep breath.  Pain that is also felt in your back, neck, shoulder, or arm, or pain that spreads to any of these areas. Your chest pain may come and go, or it may stay constant. DIAGNOSIS Lab tests or other studies may be needed to find the cause of your pain. Your health care provider may have you take a test called an ambulatory ECG (electrocardiogram). An ECG records your heartbeat patterns at the time the test is performed. You may also have other tests, such as:  Transthoracic echocardiogram (TTE). During echocardiography, sound waves are used to create a picture of all of the heart structures and to look at how blood flows through your heart.  Transesophageal echocardiogram (TEE).This is a more advanced imaging test that obtains images from inside your body. It allows your health care provider to see your heart in finer detail.  Cardiac monitoring. This allows your health care provider to monitor your heart rate and rhythm in real time.  Holter monitor. This is a portable device that records your heartbeat and can help to diagnose abnormal heartbeats. It allows your health care provider to track your heart activity for several days, if needed.  Stress tests. These can be done through exercise or by taking medicine that makes your heart  beat more quickly.  Blood tests.  Imaging tests. TREATMENT  Your treatment depends on what is causing your chest pain. Treatment may include:  Medicines. These may include:  Acid blockers for heartburn.  Anti-inflammatory medicine.  Pain medicine for inflammatory conditions.  Antibiotic medicine, if an infection is present.  Medicines to  dissolve blood clots.  Medicines to treat coronary artery disease.  Supportive care for conditions that do not require medicines. This may include:  Resting.  Applying heat or cold packs to injured areas.  Limiting activities until pain decreases. HOME CARE INSTRUCTIONS  If you were prescribed an antibiotic medicine, finish it all even if you start to feel better.  Avoid any activities that bring on chest pain.  Do not use any tobacco products, including cigarettes, chewing tobacco, or electronic cigarettes. If you need help quitting, ask your health care provider.  Do not drink alcohol.  Take medicines only as directed by your health care provider.  Keep all follow-up visits as directed by your health care provider. This is important. This includes any further testing if your chest pain does not go away.  If heartburn is the cause for your chest pain, you may be told to keep your head raised (elevated) while sleeping. This reduces the chance that acid will go from your stomach into your esophagus.  Make lifestyle changes as directed by your health care provider. These may include:  Getting regular exercise. Ask your health care provider to suggest some activities that are safe for you.  Eating a heart-healthy diet. A registered dietitian can help you to learn healthy eating options.  Maintaining a healthy weight.  Managing diabetes, if necessary.  Reducing stress. SEEK MEDICAL CARE IF:  Your chest pain does not go away after treatment.  You have a rash with blisters on your chest.  You have a fever. SEEK IMMEDIATE MEDICAL CARE IF:   Your chest pain is worse.  You have an increasing cough, or you cough up blood.  You have severe abdominal pain.  You have severe weakness.  You faint.  You have chills.  You have sudden, unexplained chest discomfort.  You have sudden, unexplained discomfort in your arms, back, neck, or jaw.  You have shortness of breath at  any time.  You suddenly start to sweat, or your skin gets clammy.  You feel nauseous or you vomit.  You suddenly feel light-headed or dizzy.  Your heart begins to beat quickly, or it feels like it is skipping beats. These symptoms may represent a serious problem that is an emergency. Do not wait to see if the symptoms will go away. Get medical help right away. Call your local emergency services (911 in the [place] Do not drive yourself to the hospital.   This information is not intended to replace advice given to you by your health care provider. Make sure you discuss any questions you have with your health care provider.   Document Released: 11/02/2004 Document Revised: 02/13/2014 Document Reviewed: 08/29/2013 Elsevier Interactive Patient Education [employer].

## 2015-07-28 NOTE — ED Notes (Signed)
Pt presents with complaint of headache and burning sensation over L side of chest x 8 days. Pt reports headache to front of head, intermittent pain. Pt. Denies URI symptoms. Pt. States pain in chest does not radiate. Pt. Ambulatory, AxO x4.

## 2015-07-28 NOTE — ED Provider Notes (Signed)
CSN: [id number]     Arrival date & time 07/28/15  1054 History   First MD Initiated Contact with Patient 07/28/15 1132     Chief Complaint  Patient presents with  . Headache  . Chest Pain     (Consider location/radiation/quality/duration/timing/severity/associated sxs/prior Treatment) HPI Comments: Patient is a 55 year old female who presents with headache and chest pain. Patient reports a left-sided, burning chest pain for the last 8 days. Patient has also had a frontal headache above her eyes that she describes as achy for the past 8 days intermittently. However, she says that she has been headache for most the day. Her headache gets worse when she tries to read. The patient thinks she needs classes. Patient used to wear reading glasses, however she lost them. She has not had them for month. Patient has had some associated lightheadedness. Patient also reports a sore throat that began Tuesday. Patient denies any cough, congestion, difficulty swallowing. Patient has been taking Tylenol for her headache without relief. Patient reports that the headache has been keeping her up at night. Patient denies any shortness of breath, abdominal pain, nausea, vomiting, dysuria. Patient also denies any recent long trips, recent surgeries or cancer, leg pain or swelling, or any estrogen use.  Patient is a 55 y.o. female presenting with headaches and chest pain. The history is provided by the patient.  Headache Associated symptoms: sore throat   Associated symptoms: no abdominal pain, no back pain, no congestion, no cough, no fever, no nausea and no vomiting   Chest Pain Associated symptoms: headache   Associated symptoms: no abdominal pain, no back pain, no cough, no dysphagia, no fever, no nausea, no shortness of breath and not vomiting     Past Medical History  Diagnosis Date  . Thrombocytopenia ([place])   . Alcohol abuse   . Tobacco abuse   . Uterine fibroid   . Iron deficiency anemia    Past  Surgical History  Procedure Laterality Date  . Caesarean section      x3  . Cesarean section      3X   Family History  Problem Relation Age of Onset  . Fibroids Mother   . Fibroids Sister    Social History  Substance Use Topics  . Smoking status: Current Every Day Smoker -- 0.03 packs/day    Types: Cigarettes  . Smokeless tobacco: Never Used  . Alcohol Use: Yes     Comment: daily 24 ounce beer every night   OB History    Gravida Para Term Preterm AB TAB SAB Ectopic Multiple Living   3 3 3  0 0 0 0 0 0 3     Review of Systems  Constitutional: Negative for fever and chills.  HENT: Positive for sore throat. Negative for congestion, facial swelling and trouble swallowing.   Respiratory: Negative for cough and shortness of breath.   Cardiovascular: Positive for chest pain. Negative for leg swelling.  Gastrointestinal: Negative for nausea, vomiting and abdominal pain.  Genitourinary: Negative for dysuria.  Musculoskeletal: Negative for back pain.  Skin: Negative for rash and wound.  Neurological: Positive for headaches.  Psychiatric/Behavioral: The patient is not nervous/anxious.       Allergies  Review of patient's allergies indicates no known allergies.  Home Medications   Prior to Admission medications   Medication Sig Start Date End Date Taking? Authorizing Provider  aspirin 325 MG tablet Take 325 mg by mouth every 6 (six) hours as needed for moderate pain.   Yes  Historical Provider, MD  famotidine (PEPCID) 20 MG tablet Take 1 tablet (20 mg total) by mouth 2 (two) times daily. 01/23/15  Yes Harvel Quale, MD  ferrous sulfate 325 (65 FE) MG tablet Take 325 mg by mouth 3 (three) times daily with meals. Reported on 01/23/2015   Yes Historical Provider, MD  hydroxypropyl methylcellulose / hypromellose (ISOPTO TEARS / GONIOVISC) 2.5 % ophthalmic solution Place 1 drop into both eyes 3 (three) times daily. 04/08/15  Yes Delsa Grana, PA-C  clindamycin (CLEOCIN) 150 MG capsule  Take 3 capsules (450 mg total) by mouth 3 (three) times daily. X 7 days Patient not taking: Reported on 01/23/2015 01/08/15   Sherwood Gambler, MD  diclofenac (CATAFLAM) 50 MG tablet Take 1 tablet (50 mg total) by mouth 3 (three) times daily. For arm pain Patient not taking: Reported on 01/08/2015 08/17/13   Billy Fischer, MD  guaiFENesin-codeine 100-10 MG/5ML syrup Take 5 mLs by mouth 3 (three) times daily as needed for cough. Patient not taking: Reported on 07/28/2015 04/08/15   Delsa Grana, PA-C  ibuprofen (MOTRIN IB) 200 MG tablet Take 4 tablets (800 mg total) by mouth every 6 (six) hours as needed. Patient not taking: Reported on 01/08/2015 04/15/14   Ottie Glazier, PA-C  naproxen (NAPROSYN) 500 MG tablet Take 1 tablet (500 mg total) by mouth 2 (two) times daily. 07/28/15   Bray Vickerman M Varun Jourdan, PA-C   BP 126/90 mmHg  Pulse 51  Temp(Src) 98 F (36.7 C) (Oral)  Resp 19  Ht 5\' 6"  (1.676 m)  Wt 69.854 kg  BMI 24.87 kg/m2  SpO2 100%  LMP 09/17/2010 Physical Exam  Constitutional: She appears well-developed and well-nourished. No distress.  HENT:  Head: Normocephalic and atraumatic.  Mouth/Throat: Oropharynx is clear and moist and mucous membranes are normal. No trismus in the jaw. No uvula swelling. No oropharyngeal exudate, posterior oropharyngeal edema, posterior oropharyngeal erythema or tonsillar abscesses.  Eyes: Conjunctivae and EOM are normal. Pupils are equal, round, and reactive to light. Right eye exhibits no discharge. Left eye exhibits no discharge. No scleral icterus.  Neck: Normal range of motion. Neck supple. No thyromegaly present.  Cardiovascular: Normal rate, regular rhythm, normal heart sounds and intact distal pulses.  Exam reveals no gallop and no friction rub.   No murmur heard. Pulmonary/Chest: Effort normal and breath sounds normal. No stridor. No respiratory distress. She has no wheezes. She has no rales. She exhibits tenderness.    Abdominal: Soft. Bowel sounds are  normal. She exhibits no distension. There is no tenderness. There is no rebound and no guarding.  Musculoskeletal: She exhibits no edema.  Lymphadenopathy:    She has cervical adenopathy (bilateral, mobile lymph noses; 1 bilaterally).  Neurological: She is alert. Coordination normal.  CN 3-12 intact; normal sensation throughout; 5/5 strength in all 4 extremities; equal bilateral grip strength; no ataxia on finger to nose; no pronator drift   Skin: Skin is warm and dry. No rash noted. She is not diaphoretic. No pallor.  Psychiatric: She has a normal mood and affect.  Nursing note and vitals reviewed.   ED Course  Procedures (including critical care time) Labs Review Labs Reviewed  BASIC METABOLIC PANEL - Abnormal; Notable for the following:    Glucose, Bld 123 (*)    All other components within normal limits  CBC - Abnormal; Notable for the following:    Platelets 138 (*)    All other components within normal limits  HCG, QUANTITATIVE, PREGNANCY - Abnormal; Notable for  the following:    hCG, Beta Chain, Quant, S 8 (*)    All other components within normal limits  I-STAT BETA HCG BLOOD, ED ([facility], [facility], [facility] ONLY) - Abnormal; Notable for the following:    I-stat hCG, quantitative 6.3 (*)    All other components within normal limits  RAPID STREP SCREEN (NOT AT [facility])  CULTURE, GROUP A STREP ([place])  I-STAT TROPOININ, ED    Imaging Review Dg Chest 2 View  07/28/2015  CLINICAL DATA:  Left chest pain for 1 month that worsened this morning. EXAM: CHEST  2 VIEW COMPARISON:  01/23/2015 abdomen CT FINDINGS: The heart size and mediastinal contours are within normal limits. Both lungs are clear. The visualized skeletal structures are unremarkable. IMPRESSION: No active cardiopulmonary disease. Electronically Signed   By: Van Clines M.D.   On: 07/28/2015 11:38   I have personally reviewed and evaluated these images and lab results as part of my medical decision-making.   EKG  Interpretation   Date/Time:  Wednesday July 28 2015 11:01:06 EDT Ventricular Rate:  69 PR Interval:    QRS Duration: 99 QT Interval:  418 QTC Calculation: 451 R Axis:   -9 Text Interpretation:  Sinus rhythm Probable left atrial enlargement Low  voltage, precordial leads Abnormal R-wave progression, early transition  Baseline wander in lead(s) V6 Confirmed by Jeneen Rinks  MD, Webb ([id number]) on  07/28/2015 12:19:00 PM      1345 On reevaluation following Toradol, patient still complaining of headache. I will order Reglan, Benadryl, Depacon.  MDM   CBC shows platelets 138. BMP unremarkable. Troponin 0.00. Since the patient has had the pain for the past 8 days intermittently, Dr. Jeneen Rinks and I agree that a delta troponin is unnecessary at this time. Rapid strep negative. (Patient denies sore throat at discharge). CXR shows no active cardiopulmonary disease. EKG shows NSR, normal R-wave progression. Patient's headache improved with Toradol, Compazine, Benadryl, Depacon NAD. Patient also without chest pain. Patient is low risk for subarachnoid hemorrhage or other intracranial abnormalities, especially since her symptoms have been intermittent for the past 8 days and seemed to be related to straining with reading.  I'll discharge patient with follow-up to ophthalmology for eye exam. Patient also discharged with Naprosyn for 1 week. Strict return precautions given. Patient understands and is in agreement with plan. Patient also advised to follow up and establish care with a PCP. Patient vitals stable throughout ED course and discharged in satisfactory condition. I discussed patient with Dr. Jeneen Rinks who is in agreement with plan.  Final diagnoses:  Chest pain, unspecified chest pain type  Acute nonintractable headache, unspecified headache type        Frederica Kuster, PA-C 07/28/15 1622  Tanna Furry, MD 08/04/15 1743

## 2015-07-30 LAB — CULTURE, GROUP A STREP (THRC)

## 2016-01-19 ENCOUNTER — Emergency Department (HOSPITAL_COMMUNITY)
Admission: EM | Admit: 2016-01-19 | Discharge: 2016-01-20 | Disposition: A | Discharge status: Home / Self Care | Payer: Self-pay | Attending: Emergency Medicine | Admitting: Emergency Medicine

## 2016-01-19 ENCOUNTER — Emergency Department (HOSPITAL_COMMUNITY): Payer: Self-pay

## 2016-01-19 ENCOUNTER — Encounter (HOSPITAL_COMMUNITY): Payer: Self-pay

## 2016-01-19 DIAGNOSIS — R519 Headache, unspecified: Secondary | ICD-10-CM

## 2016-01-19 DIAGNOSIS — R51 Headache: Secondary | ICD-10-CM | POA: Insufficient documentation

## 2016-01-19 DIAGNOSIS — Z7982 Long term (current) use of aspirin: Secondary | ICD-10-CM | POA: Insufficient documentation

## 2016-01-19 DIAGNOSIS — F17210 Nicotine dependence, cigarettes, uncomplicated: Secondary | ICD-10-CM | POA: Insufficient documentation

## 2016-01-19 MED ORDER — KETOROLAC TROMETHAMINE 30 MG/ML IJ SOLN
15.0000 mg | Freq: Once | INTRAMUSCULAR | Status: AC
  Administered 2016-01-19: 15 mg via INTRAVENOUS
  Filled 2016-01-19: qty 1

## 2016-01-19 MED ORDER — METOCLOPRAMIDE HCL 5 MG/ML IJ SOLN
10.0000 mg | Freq: Once | INTRAMUSCULAR | Status: AC
  Administered 2016-01-19: 10 mg via INTRAVENOUS
  Filled 2016-01-19: qty 2

## 2016-01-19 MED ORDER — DIPHENHYDRAMINE HCL 50 MG/ML IJ SOLN
12.5000 mg | Freq: Once | INTRAMUSCULAR | Status: AC
  Administered 2016-01-19: 12.5 mg via INTRAVENOUS
  Filled 2016-01-19: qty 1

## 2016-01-19 MED ORDER — SODIUM CHLORIDE 0.9 % IV BOLUS (SEPSIS)
500.0000 mL | Freq: Once | INTRAVENOUS | Status: AC
  Administered 2016-01-19: 500 mL via INTRAVENOUS

## 2016-01-19 NOTE — ED Provider Notes (Signed)
[facility] DEPT Provider Note   CSN: [id number] Arrival date & time: 01/19/16  2220     History   Chief Complaint Chief Complaint  Patient presents with  . Headache  . Hypertension    HPI Evelyn Crawford is a 55 y.o. female.  HPI  Past Medical History:  Diagnosis Date  . Alcohol abuse   . Iron deficiency anemia   . Thrombocytopenia ([place])   . Tobacco abuse   . Uterine fibroid     Patient Active Problem List   Diagnosis Date Noted  . THROMBOCYTOPENIA 11/22/2009  . FIBROIDS, UTERUS 11/15/2009  . ANEMIA, IRON DEFICIENCY 11/15/2009  . ALCOHOL ABUSE 11/15/2009  . TOBACCO ABUSE 11/15/2009    Past Surgical History:  Procedure Laterality Date  . caesarean section     x3  . CESAREAN SECTION     3X    OB History    Gravida Para Term Preterm AB Living   3 3 3  0 0 3   SAB TAB Ectopic Multiple Live Births   0 0 0 0         Home Medications    Prior to Admission medications   Medication Sig Start Date End Date Taking? Authorizing Provider  aspirin 325 MG tablet Take 325 mg by mouth every 6 (six) hours as needed for moderate pain.    Historical Provider, MD  clindamycin (CLEOCIN) 150 MG capsule Take 3 capsules (450 mg total) by mouth 3 (three) times daily. X 7 days Patient not taking: Reported on 01/23/2015 01/08/15   Sherwood Gambler, MD  diclofenac (CATAFLAM) 50 MG tablet Take 1 tablet (50 mg total) by mouth 3 (three) times daily. For arm pain Patient not taking: Reported on 01/08/2015 08/17/13   Billy Fischer, MD  famotidine (PEPCID) 20 MG tablet Take 1 tablet (20 mg total) by mouth 2 (two) times daily. 01/23/15   Harvel Quale, MD  ferrous sulfate 325 (65 FE) MG tablet Take 325 mg by mouth 3 (three) times daily with meals. Reported on 01/23/2015    Historical Provider, MD  guaiFENesin-codeine 100-10 MG/5ML syrup Take 5 mLs by mouth 3 (three) times daily as needed for cough. Patient not taking: Reported on 07/28/2015 04/08/15   Delsa Grana, PA-C  hydroxypropyl  methylcellulose / hypromellose (ISOPTO TEARS / GONIOVISC) 2.5 % ophthalmic solution Place 1 drop into both eyes 3 (three) times daily. 04/08/15   Delsa Grana, PA-C  ibuprofen (MOTRIN IB) 200 MG tablet Take 4 tablets (800 mg total) by mouth every 6 (six) hours as needed. Patient not taking: Reported on 01/08/2015 04/15/14   Ottie Glazier, PA-C  naproxen (NAPROSYN) 500 MG tablet Take 1 tablet (500 mg total) by mouth 2 (two) times daily. 07/28/15   Frederica Kuster, PA-C    Family History Family History  Problem Relation Age of Onset  . Fibroids Mother   . Fibroids Sister     Social History Social History  Substance Use Topics  . Smoking status: Current Every Day Smoker    Packs/day: 0.50    Types: Cigarettes  . Smokeless tobacco: Never Used  . Alcohol use Yes     Comment: daily 24 ounce beer every night     Allergies   Patient has no known allergies.   Review of Systems Review of Systems   Physical Exam Updated Vital Signs BP 159/89   Pulse (!) 54   Temp 97.8 F (36.6 C) (Oral)   Resp 17   Ht 5\' 6"  (  1.676 m)   Wt 70.8 kg   LMP 09/17/2010   SpO2 100%   BMI 25.18 kg/m   Physical Exam   ED Treatments / Results  Labs (all labs ordered are listed, but only abnormal results are displayed) Labs Reviewed - No data to display  EKG  EKG Interpretation None       Radiology Ct Head Wo Contrast  Result Date: 01/19/2016 CLINICAL DATA:  56 year old female with headache. EXAM: CT HEAD WITHOUT CONTRAST TECHNIQUE: Contiguous axial images were obtained from the base of the skull through the vertex without intravenous contrast. COMPARISON:  None. FINDINGS: Brain: No evidence of acute infarction, hemorrhage, hydrocephalus, extra-axial collection or mass lesion/mass effect. Vascular: No hyperdense vessel or unexpected calcification. Skull: Normal. Negative for fracture or focal lesion. Sinuses/Orbits: Mild mucoperiosteal thickening of paranasal sinuses. No air-fluid levels. The  mastoid air cells are clear. The globes are intact. Other: None IMPRESSION: No acute intracranial pathology. Electronically Signed   By: Anner Crete M.D.   On: 01/19/2016 23:45    Procedures Procedures (including critical care time)  Medications Ordered in ED Medications  sodium chloride 0.9 % bolus 500 mL (0 mLs Intravenous Stopped 01/20/16 0031)  metoCLOPramide (REGLAN) injection 10 mg (10 mg Intravenous Given 01/19/16 2309)  diphenhydrAMINE (BENADRYL) injection 12.5 mg (12.5 mg Intravenous Given 01/19/16 2310)  ketorolac (TORADOL) 30 MG/ML injection 15 mg (15 mg Intravenous Given 01/19/16 2310)     Initial Impression / Assessment and Plan / ED Course  I have reviewed the triage vital signs and the nursing notes.  Pertinent labs & imaging results that were available during my care of the patient were reviewed by me and considered in my medical decision making (see chart for details).  Clinical Course        Final Clinical Impressions(s) / ED Diagnoses   Final diagnoses:  Bad headache    New Prescriptions Discharge Medication List as of 01/20/2016 12:57 AM       Junius Creamer, NP 01/20/16 2001    Gwenyth Allegra Tegeler, MD 01/22/16 2117

## 2016-01-19 NOTE — ED Triage Notes (Signed)
Headache since 1130 this morning. Per ems bp was 200/100. No hx of hypertension. No neuro deficit

## 2016-01-19 NOTE — ED Provider Notes (Signed)
[facility] DEPT Provider Note   CSN: [id number] Arrival date & time: 01/19/16  2220     History   Chief Complaint Chief Complaint  Patient presents with  . Headache  . Hypertension    HPI Evelyn Crawford is a 55 y.o. female.  Is a 55 year old lady with vague remote history of headaches.  He reports that she's had a frontal headache for the past 2 days.  She has not taken any medication.  She denies photophobia, nausea, vomiting, URI symptoms, sinus conditions.  He states that she has noticed when she is using her phone to read that she is squinting more she's tried over-the-counter reading glasses, which has helped      Past Medical History:  Diagnosis Date  . Alcohol abuse   . Iron deficiency anemia   . Thrombocytopenia ([place])   . Tobacco abuse   . Uterine fibroid     Patient Active Problem List   Diagnosis Date Noted  . THROMBOCYTOPENIA 11/22/2009  . FIBROIDS, UTERUS 11/15/2009  . ANEMIA, IRON DEFICIENCY 11/15/2009  . ALCOHOL ABUSE 11/15/2009  . TOBACCO ABUSE 11/15/2009    Past Surgical History:  Procedure Laterality Date  . caesarean section     x3  . CESAREAN SECTION     3X    OB History    Gravida Para Term Preterm AB Living   3 3 3  0 0 3   SAB TAB Ectopic Multiple Live Births   0 0 0 0         Home Medications    Prior to Admission medications   Medication Sig Start Date End Date Taking? Authorizing Provider  aspirin 325 MG tablet Take 325 mg by mouth every 6 (six) hours as needed for moderate pain.    Historical Provider, MD  clindamycin (CLEOCIN) 150 MG capsule Take 3 capsules (450 mg total) by mouth 3 (three) times daily. X 7 days Patient not taking: Reported on 01/23/2015 01/08/15   Sherwood Gambler, MD  diclofenac (CATAFLAM) 50 MG tablet Take 1 tablet (50 mg total) by mouth 3 (three) times daily. For arm pain Patient not taking: Reported on 01/08/2015 08/17/13   Billy Fischer, MD  famotidine (PEPCID) 20 MG tablet Take 1 tablet (20 mg  total) by mouth 2 (two) times daily. 01/23/15   Harvel Quale, MD  ferrous sulfate 325 (65 FE) MG tablet Take 325 mg by mouth 3 (three) times daily with meals. Reported on 01/23/2015    Historical Provider, MD  guaiFENesin-codeine 100-10 MG/5ML syrup Take 5 mLs by mouth 3 (three) times daily as needed for cough. Patient not taking: Reported on 07/28/2015 04/08/15   Delsa Grana, PA-C  hydroxypropyl methylcellulose / hypromellose (ISOPTO TEARS / GONIOVISC) 2.5 % ophthalmic solution Place 1 drop into both eyes 3 (three) times daily. 04/08/15   Delsa Grana, PA-C  ibuprofen (MOTRIN IB) 200 MG tablet Take 4 tablets (800 mg total) by mouth every 6 (six) hours as needed. Patient not taking: Reported on 01/08/2015 04/15/14   Ottie Glazier, PA-C  naproxen (NAPROSYN) 500 MG tablet Take 1 tablet (500 mg total) by mouth 2 (two) times daily. 07/28/15   Frederica Kuster, PA-C    Family History Family History  Problem Relation Age of Onset  . Fibroids Mother   . Fibroids Sister     Social History Social History  Substance Use Topics  . Smoking status: Current Every Day Smoker    Packs/day: 0.50    Types: Cigarettes  .  Smokeless tobacco: Never Used  . Alcohol use Yes     Comment: daily 24 ounce beer every night     Allergies   Patient has no known allergies.   Review of Systems Review of Systems  Constitutional: Negative for chills and fever.  HENT: Negative for congestion, postnasal drip, rhinorrhea and sinus pressure.   Eyes: Negative for visual disturbance.  Respiratory: Negative for cough and shortness of breath.   Cardiovascular: Negative for chest pain.  Gastrointestinal: Negative for nausea.  Genitourinary: Negative for dysuria.  Musculoskeletal: Negative for back pain and neck pain.  Skin: Negative for rash.  Neurological: Positive for headaches. Negative for dizziness and weakness.  All other systems reviewed and are negative.    Physical Exam Updated Vital Signs BP 136/88    Pulse (!) 59   Temp 97.8 F (36.6 C) (Oral)   Resp 19   Ht 5\' 6"  (1.676 m)   Wt 70.8 kg   LMP 09/17/2010   SpO2 100%   BMI 25.18 kg/m   Physical Exam  Constitutional: She is oriented to person, place, and time. She appears well-developed and well-nourished. No distress.  HENT:  Head: Normocephalic.  Right Ear: External ear normal.  Left Ear: External ear normal.  Extensive dental decay  Cardiovascular: Normal rate.   Pulmonary/Chest: Effort normal.  Abdominal: Soft.  Musculoskeletal: Normal range of motion.  Neurological: She is alert and oriented to person, place, and time.  Skin: Skin is warm.  Psychiatric: She has a normal mood and affect.  Nursing note and vitals reviewed.    ED Treatments / Results  Labs (all labs ordered are listed, but only abnormal results are displayed) Labs Reviewed - No data to display  EKG  EKG Interpretation None       Radiology Ct Head Wo Contrast  Result Date: 01/19/2016 CLINICAL DATA:  55 year old female with headache. EXAM: CT HEAD WITHOUT CONTRAST TECHNIQUE: Contiguous axial images were obtained from the base of the skull through the vertex without intravenous contrast. COMPARISON:  None. FINDINGS: Brain: No evidence of acute infarction, hemorrhage, hydrocephalus, extra-axial collection or mass lesion/mass effect. Vascular: No hyperdense vessel or unexpected calcification. Skull: Normal. Negative for fracture or focal lesion. Sinuses/Orbits: Mild mucoperiosteal thickening of paranasal sinuses. No air-fluid levels. The mastoid air cells are clear. The globes are intact. Other: None IMPRESSION: No acute intracranial pathology. Electronically Signed   By: Anner Crete M.D.   On: 01/19/2016 23:45    Procedures Procedures (including critical care time)  Medications Ordered in ED Medications  sodium chloride 0.9 % bolus 500 mL (0 mLs Intravenous Stopped 01/20/16 0031)  metoCLOPramide (REGLAN) injection 10 mg (10 mg Intravenous  Given 01/19/16 2309)  diphenhydrAMINE (BENADRYL) injection 12.5 mg (12.5 mg Intravenous Given 01/19/16 2310)  ketorolac (TORADOL) 30 MG/ML injection 15 mg (15 mg Intravenous Given 01/19/16 2310)     Initial Impression / Assessment and Plan / ED Course  I have reviewed the triage vital signs and the nursing notes.  Pertinent labs & imaging results that were available during my care of the patient were reviewed by me and considered in my medical decision making (see chart for details).  Clinical Course      Patient will be given IV fluids, Reglan, Toradol and Benadryl and reassessed  At this time, I do not believe labs or imaging is necessary Reevaluated.  She is now symptom free  Final Clinical Impressions(s) / ED Diagnoses   Final diagnoses:  Bad headache  New Prescriptions New Prescriptions   No medications on file     Junius Creamer, NP 01/20/16 [id number]    Gwenyth Allegra Tegeler, MD 01/20/16 [phone]

## 2016-01-20 NOTE — Discharge Instructions (Signed)
You can safely take Tylenol or Ibuprofen for headache symptoms

## 2016-01-20 NOTE — ED Notes (Signed)
Pt stable, ambulatory, states understanding of discharge instructions 

## 2017-05-14 ENCOUNTER — Other Ambulatory Visit: Payer: Self-pay

## 2017-05-14 ENCOUNTER — Encounter (HOSPITAL_COMMUNITY): Payer: Self-pay | Admitting: Neurology

## 2017-05-14 ENCOUNTER — Emergency Department (HOSPITAL_COMMUNITY)
Admission: EM | Admit: 2017-05-14 | Discharge: 2017-05-14 | Disposition: A | Discharge status: Home / Self Care | Payer: Self-pay | Attending: Emergency Medicine | Admitting: Emergency Medicine

## 2017-05-14 DIAGNOSIS — J069 Acute upper respiratory infection, unspecified: Secondary | ICD-10-CM | POA: Insufficient documentation

## 2017-05-14 DIAGNOSIS — B9789 Other viral agents as the cause of diseases classified elsewhere: Secondary | ICD-10-CM

## 2017-05-14 DIAGNOSIS — F17210 Nicotine dependence, cigarettes, uncomplicated: Secondary | ICD-10-CM | POA: Insufficient documentation

## 2017-05-14 DIAGNOSIS — Z7982 Long term (current) use of aspirin: Secondary | ICD-10-CM | POA: Insufficient documentation

## 2017-05-14 DIAGNOSIS — Z79899 Other long term (current) drug therapy: Secondary | ICD-10-CM | POA: Insufficient documentation

## 2017-05-14 MED ORDER — ALBUTEROL SULFATE HFA 108 (90 BASE) MCG/ACT IN AERS: 1.0000 | INHALATION_SPRAY | Freq: Four times a day (QID) | RESPIRATORY_TRACT | 0 refills | Status: DC | PRN

## 2017-05-14 MED ORDER — BENZONATATE 100 MG PO CAPS: 100.0000 mg | ORAL_CAPSULE | Freq: Three times a day (TID) | ORAL | 0 refills | Status: DC | PRN

## 2017-05-14 NOTE — ED Provider Notes (Signed)
[facility] EMERGENCY DEPARTMENT Provider Note   CSN: [id number] Arrival date & time: 05/14/17  1145     History   Chief Complaint Chief Complaint  Patient presents with  . Cough    HPI Evelyn Crawford is a 57 y.o. female with a history of tobacco use is here for evaluation of cough with productive sputum for the last 2 days, intermittent wheezing worse at night. Initially had runny nose and sore throat 2 days ago but these have resolved. He has been taking NyQuil at home without relief. She takes no medications and does not have any known medical conditions. She denies fevers, chills, chest pain, shortness of breath, difficulty breathing.  HPI  Past Medical History:  Diagnosis Date  . Alcohol abuse   . Iron deficiency anemia   . Thrombocytopenia ([place])   . Tobacco abuse   . Uterine fibroid     Patient Active Problem List   Diagnosis Date Noted  . THROMBOCYTOPENIA 11/22/2009  . FIBROIDS, UTERUS 11/15/2009  . ANEMIA, IRON DEFICIENCY 11/15/2009  . ALCOHOL ABUSE 11/15/2009  . TOBACCO ABUSE 11/15/2009    Past Surgical History:  Procedure Laterality Date  . caesarean section     x3  . CESAREAN SECTION     3X     OB History    Gravida  3   Para  3   Term  3   Preterm  0   AB  0   Living  3     SAB  0   TAB  0   Ectopic  0   Multiple  0   Live Births               Home Medications    Prior to Admission medications   Medication Sig Start Date End Date Taking? Authorizing Provider  albuterol (PROVENTIL HFA;VENTOLIN HFA) 108 (90 Base) MCG/ACT inhaler Inhale 1-2 puffs into the lungs every 6 (six) hours as needed for wheezing or shortness of breath. 05/14/17   Kinnie Feil, PA-C  aspirin 325 MG tablet Take 325 mg by mouth every 6 (six) hours as needed for moderate pain.    [provider]  benzonatate (TESSALON PERLES) 100 MG capsule Take 1 capsule (100 mg total) by mouth 3 (three) times daily as needed for cough. FOR  DISRUPTIVE DAY TIME COUGH 05/14/17   Kinnie Feil, PA-C  clindamycin (CLEOCIN) 150 MG capsule Take 3 capsules (450 mg total) by mouth 3 (three) times daily. X 7 days Patient not taking: Reported on 01/23/2015 01/08/15   Sherwood Gambler, MD  diclofenac (CATAFLAM) 50 MG tablet Take 1 tablet (50 mg total) by mouth 3 (three) times daily. For arm pain Patient not taking: Reported on 01/08/2015 08/17/13   Billy Fischer, MD  famotidine (PEPCID) 20 MG tablet Take 1 tablet (20 mg total) by mouth 2 (two) times daily. 01/23/15   Harvel Quale, MD  ferrous sulfate 325 (65 FE) MG tablet Take 325 mg by mouth 3 (three) times daily with meals. Reported on 01/23/2015    [provider]  guaiFENesin-codeine 100-10 MG/5ML syrup Take 5 mLs by mouth 3 (three) times daily as needed for cough. Patient not taking: Reported on 07/28/2015 04/08/15   Delsa Grana, PA-C  hydroxypropyl methylcellulose / hypromellose (ISOPTO TEARS / GONIOVISC) 2.5 % ophthalmic solution Place 1 drop into both eyes 3 (three) times daily. 04/08/15   Delsa Grana, PA-C  ibuprofen (MOTRIN IB) 200 MG tablet Take  4 tablets (800 mg total) by mouth every 6 (six) hours as needed. Patient not taking: Reported on 01/08/2015 04/15/14   Patel-Mills, Orvil Feil, PA-C  naproxen (NAPROSYN) 500 MG tablet Take 1 tablet (500 mg total) by mouth 2 (two) times daily. 07/28/15   Frederica Kuster, PA-C    Family History Family History  Problem Relation Age of Onset  . Fibroids Mother   . Fibroids Sister     Social History Social History   Tobacco Use  . Smoking status: Current Every Day Smoker    Packs/day: 0.50    Types: Cigarettes  . Smokeless tobacco: Never Used  Substance Use Topics  . Alcohol use: Yes    Comment: daily 24 ounce beer every night  . Drug use: No     Allergies   Patient has no known allergies.   Review of Systems Review of Systems  Respiratory: Positive for cough and wheezing.   All other systems reviewed and are  negative.    Physical Exam Updated Vital Signs BP (!) 164/105 (BP Location: Right Arm)   Pulse 77   Temp 98.3 F (36.8 C) (Oral)   Resp 18   Ht 5\' 7"  (1.702 m)   Wt 70.8 kg (156 lb)   LMP 09/17/2010   SpO2 97%   BMI 24.43 kg/m   Physical Exam  Constitutional: She is oriented to person, place, and time. She appears well-developed and well-nourished. No distress.  NAD. Non toxic.   HENT:  Head: Normocephalic and atraumatic.  Right Ear: External ear normal.  Left Ear: External ear normal.  Nose: Nose normal.  Eyes: Conjunctivae and EOM are normal. No scleral icterus.  Neck: Normal range of motion. Neck supple.  Cardiovascular: Normal rate, regular rhythm and normal heart sounds.  No murmur heard. Pulmonary/Chest: Effort normal and breath sounds normal. She has no wheezes. She has no rales.  No wheezing or rales, no hypoxia, tachypnea. Normal work of breathing.   Musculoskeletal: Normal range of motion. She exhibits no deformity.  Neurological: She is alert and oriented to person, place, and time.  Skin: Skin is warm and dry. Capillary refill takes less than 2 seconds.  Psychiatric: She has a normal mood and affect. Her behavior is normal. Judgment and thought content normal.  Nursing note and vitals reviewed.    ED Treatments / Results  Labs (all labs ordered are listed, but only abnormal results are displayed) Labs Reviewed - No data to display  EKG None  Radiology No results found.  Procedures Procedures (including critical care time)  Medications Ordered in ED Medications - No data to display   Initial Impression / Assessment and Plan / ED Course  I have reviewed the triage vital signs and the nursing notes.  Pertinent labs & imaging results that were available during my care of the patient were reviewed by me and considered in my medical decision making (see chart for details).     57 y.o. -year-old female with ppmh of tobacco abuse presents with URI  like symptoms  2 days. On my exam patient is nontoxic appearing, speaking in full sentences, w/o increased WOB. No fever, tachypnea, tachycardia, hypoxia. Lungs are CTAB. I do not think that a CXR is indicated at this time as VS are WNL, there are no signs of consolidation on auscultation and there is no hypoxia. No significant h/o immunocompromise.  Given reassuring physical exam, will discharge with symptomatic treatment. Strict ED return precautions given. Patient is aware that a viral  URI infection may precede pneumonia or worsening illness. Patient is aware of red flag symptoms to monitor for that would warrant return to the ED for further reevaluation.   Final Clinical Impressions(s) / ED Diagnoses   Final diagnoses:  Viral URI with cough    ED Discharge Orders        Ordered    albuterol (PROVENTIL HFA;VENTOLIN HFA) 108 (90 Base) MCG/ACT inhaler  Every 6 hours PRN     05/14/17 1510    benzonatate (TESSALON PERLES) 100 MG capsule  3 times daily PRN     05/14/17 1510       Kinnie Feil, PA-C 05/14/17 [place], Dan, DO 05/14/17 1523

## 2017-05-14 NOTE — Discharge Instructions (Addendum)
Albuterol inhaler every 4-6 hours for cough, chest tightness, wheezing. Tessalon perles at night time to help suppress cough. Stay hydrated.   Return for fevers, chills, chest pain, shortness of breath, difficulty breathing.  Your blood pressure was elevated today.  Monitor your blood pressure and keep note of the trend, ideally your blood pressure should be 150/90.  Contact [facility] to establish care with a primary care provider.

## 2017-05-14 NOTE — ED Triage Notes (Addendum)
Pt is here with cough since Saturday, cough is productive is clear. Last night she had bad coughing fit. Couldn't sleep last night b/c coughing. Denies fevers. Is a x 4. Is a smoker.

## 2017-10-22 ENCOUNTER — Emergency Department (HOSPITAL_COMMUNITY): Payer: Self-pay

## 2017-10-22 ENCOUNTER — Emergency Department (HOSPITAL_COMMUNITY)
Admission: EM | Admit: 2017-10-22 | Discharge: 2017-10-22 | Disposition: A | Discharge status: Home / Self Care | Payer: Self-pay | Attending: Emergency Medicine | Admitting: Emergency Medicine

## 2017-10-22 ENCOUNTER — Encounter (HOSPITAL_COMMUNITY): Payer: Self-pay | Admitting: *Deleted

## 2017-10-22 DIAGNOSIS — Z79899 Other long term (current) drug therapy: Secondary | ICD-10-CM | POA: Insufficient documentation

## 2017-10-22 DIAGNOSIS — Z7982 Long term (current) use of aspirin: Secondary | ICD-10-CM | POA: Insufficient documentation

## 2017-10-22 DIAGNOSIS — M10072 Idiopathic gout, left ankle and foot: Secondary | ICD-10-CM | POA: Insufficient documentation

## 2017-10-22 DIAGNOSIS — F17210 Nicotine dependence, cigarettes, uncomplicated: Secondary | ICD-10-CM | POA: Insufficient documentation

## 2017-10-22 DIAGNOSIS — M109 Gout, unspecified: Secondary | ICD-10-CM

## 2017-10-22 MED ORDER — OXYCODONE-ACETAMINOPHEN 5-325 MG PO TABS: 1.0000 | ORAL_TABLET | ORAL | 0 refills | Status: DC | PRN

## 2017-10-22 MED ORDER — OXYCODONE-ACETAMINOPHEN 5-325 MG PO TABS: 1.0000 | ORAL_TABLET | Freq: Three times a day (TID) | ORAL | 0 refills | Status: DC | PRN

## 2017-10-22 MED ORDER — PREDNISONE 20 MG PO TABS: 20.0000 mg | ORAL_TABLET | Freq: Two times a day (BID) | ORAL | 0 refills | Status: AC

## 2017-10-22 NOTE — Progress Notes (Signed)
Orthopedic Tech Progress Note Patient Details:  Evelyn Crawford 01-07-1961 [id number]  Ortho Devices Type of Ortho Device: Crutches Ortho Device/Splint Interventions: Application   Post Interventions Patient Tolerated: Well Instructions Provided: Care of device, Adjustment of device   [place] Pink 10/22/2017, 11:22 AM

## 2017-10-22 NOTE — ED Provider Notes (Signed)
[facility] EMERGENCY DEPARTMENT Provider Note   CSN: [id number] Arrival date & time: 10/22/17  8341     History   Chief Complaint Chief Complaint  Patient presents with  . Toe Pain    HPI Evelyn Crawford is a 57 y.o. female with a h/o of alcohol abuse, iron deficiency anemia, and tobacco abuse who presents to the emergency department with a chief complaint of left great toe pain, onset 3 days ago.  Reports some associated redness to the toe.  No trauma or injury.  No history of previous surgeries or fractures.  Pain is worse with bearing weight on the foot and she has been changing her gait to try and improve the pain.  No history of similar symptoms.  She denies fever or chills.  She states that she was drinking alcohol 1 to 2 days prior to the onset of her symptoms.  The history is provided by the patient. No language interpreter was used.    Past Medical History:  Diagnosis Date  . Alcohol abuse   . Iron deficiency anemia   . Thrombocytopenia ([place])   . Tobacco abuse   . Uterine fibroid     Patient Active Problem List   Diagnosis Date Noted  . THROMBOCYTOPENIA 11/22/2009  . FIBROIDS, UTERUS 11/15/2009  . ANEMIA, IRON DEFICIENCY 11/15/2009  . ALCOHOL ABUSE 11/15/2009  . TOBACCO ABUSE 11/15/2009    Past Surgical History:  Procedure Laterality Date  . caesarean section     x3  . CESAREAN SECTION     3X     OB History    Gravida  3   Para  3   Term  3   Preterm  0   AB  0   Living  3     SAB  0   TAB  0   Ectopic  0   Multiple  0   Live Births               Home Medications    Prior to Admission medications   Medication Sig Start Date End Date Taking? Authorizing Provider  albuterol (PROVENTIL HFA;VENTOLIN HFA) 108 (90 Base) MCG/ACT inhaler Inhale 1-2 puffs into the lungs every 6 (six) hours as needed for wheezing or shortness of breath. 05/14/17   Kinnie Feil, PA-C  aspirin 325 MG tablet Take 325 mg by mouth  every 6 (six) hours as needed for moderate pain.    [provider]  benzonatate (TESSALON PERLES) 100 MG capsule Take 1 capsule (100 mg total) by mouth 3 (three) times daily as needed for cough. FOR DISRUPTIVE DAY TIME COUGH 05/14/17   Kinnie Feil, PA-C  famotidine (PEPCID) 20 MG tablet Take 1 tablet (20 mg total) by mouth 2 (two) times daily. 01/23/15   Harvel Quale, MD  ferrous sulfate 325 (65 FE) MG tablet Take 325 mg by mouth 3 (three) times daily with meals. Reported on 01/23/2015    [provider]  hydroxypropyl methylcellulose / hypromellose (ISOPTO TEARS / GONIOVISC) 2.5 % ophthalmic solution Place 1 drop into both eyes 3 (three) times daily. 04/08/15   Delsa Grana, PA-C  naproxen (NAPROSYN) 500 MG tablet Take 1 tablet (500 mg total) by mouth 2 (two) times daily. 07/28/15   Law, Bea Graff, PA-C  oxyCODONE-acetaminophen (PERCOCET/ROXICET) 5-325 MG tablet Take 1 tablet by mouth every 8 (eight) hours as needed for severe pain. 10/22/17   Cheray Pardi A, PA-C  predniSONE (DELTASONE) 20  MG tablet Take 1 tablet (20 mg total) by mouth 2 (two) times daily with a meal for 7 days. 10/22/17 10/29/17  Marlene Pfluger, Laymond Purser, PA-C    Family History Family History  Problem Relation Age of Onset  . Fibroids Mother   . Fibroids Sister     Social History Social History   Tobacco Use  . Smoking status: Current Every Day Smoker    Packs/day: 0.50    Types: Cigarettes  . Smokeless tobacco: Never Used  Substance Use Topics  . Alcohol use: Yes    Comment: daily 24 ounce beer every night  . Drug use: No     Allergies   Patient has no known allergies.   Review of Systems Review of Systems  Constitutional: Negative for activity change and chills.  Respiratory: Negative for shortness of breath.   Cardiovascular: Negative for chest pain.  Gastrointestinal: Negative for abdominal pain.  Genitourinary: Negative for dysuria.  Musculoskeletal: Positive for arthralgias, gait  problem, joint swelling and myalgias. Negative for back pain, neck pain and neck stiffness.  Skin: Negative for rash.  Allergic/Immunologic: Negative for immunocompromised state.  Neurological: Negative for weakness and headaches.  Psychiatric/Behavioral: Negative for confusion.     Physical Exam Updated Vital Signs BP (!) 163/89 (BP Location: Right Arm)   Pulse 75   Temp 98.8 F (37.1 C) (Oral)   Resp 18   LMP 09/17/2010   SpO2 100%   Physical Exam  Constitutional: No distress.  HENT:  Head: Normocephalic.  Eyes: Conjunctivae are normal.  Neck: Neck supple.  Cardiovascular: Normal rate and regular rhythm. Exam reveals no gallop and no friction rub.  No murmur heard. Pulmonary/Chest: Effort normal. No respiratory distress.  Abdominal: Soft. She exhibits no distension.  Musculoskeletal:  Tender to palpation over the first MTP of the left hallux.  There is some overlying redness and mild warmth.  No tenderness to the IP joints of the left great toe.  5 out of 5 strength against resistance with dorsiflexion and plantarflexion.  Sensation is intact and equal throughout.  DP and PT pulses are 2+ and symmetric.  Full active and passive range of motion of the bilateral ankles.  Neurological: She is alert.  Skin: Skin is warm. No rash noted.  Psychiatric: Her behavior is normal.  Nursing note and vitals reviewed.    ED Treatments / Results  Labs (all labs ordered are listed, but only abnormal results are displayed) Labs Reviewed - No data to display  EKG None  Radiology Dg Toe Great Left  Result Date: 10/22/2017 CLINICAL DATA:  Pain EXAM: LEFT FIRST TOE: 3 V COMPARISON:  None. FINDINGS: Frontal, oblique, and lateral views were obtained. No fracture or dislocation. Joint spaces appear normal. No erosive change. IMPRESSION: No fracture or dislocation.  No evident arthropathy. Electronically Signed   By: Lowella Grip III M.D.   On: 10/22/2017 10:02     Procedures Procedures (including critical care time)  Medications Ordered in ED Medications - No data to display   Initial Impression / Assessment and Plan / ED Course  I have reviewed the triage vital signs and the nursing notes.  Pertinent labs & imaging results that were available during my care of the patient were reviewed by me and considered in my medical decision making (see chart for details).     Pt presents with monoarticular pain, swelling and erythema.  Pt is afebrile and stable. Imaging reviewed, no evidence of occult fracture or injury.  Doubt septic  joint.  Renal function good based on previous labs. Pt without known peptic ulcer disease and not receiving concurrent treatment on warfarin.  We will discharge the patient with prednisone as the patient has history of alcohol use disorder. Discussed that pt should respond to treatment with in 24 hour of begining treatment & likely resolve in 2-3 days.   Final Clinical Impressions(s) / ED Diagnoses   Final diagnoses:  Gouty arthritis of left great toe    ED Discharge Orders         Ordered    predniSONE (DELTASONE) 20 MG tablet  2 times daily with meals     10/22/17 1054    oxyCODONE-acetaminophen (PERCOCET/ROXICET) 5-325 MG tablet  Every 4 hours PRN,   Status:  Discontinued     10/22/17 1102    oxyCODONE-acetaminophen (PERCOCET/ROXICET) 5-325 MG tablet  Every 8 hours PRN     10/22/17 1104           Abhiraj Dozal A, PA-C 10/22/17 1112    Hayden Rasmussen, MD 10/23/17 [phone]

## 2017-10-22 NOTE — ED Notes (Signed)
Pt verbalized understanding of discharge instructions and denies any further questions at this time.   

## 2017-10-22 NOTE — ED Triage Notes (Signed)
Pt in c/o left great toe pain that started a few days ago, redness to joint noted, denies injury

## 2017-10-22 NOTE — ED Notes (Signed)
Patient transported to X-ray 

## 2017-10-22 NOTE — ED Notes (Signed)
ED Provider at bedside. 

## 2017-10-22 NOTE — ED Notes (Signed)
Ortho paged for crutches 

## 2017-10-22 NOTE — Discharge Instructions (Addendum)
Thank you for allowing me to care for you today in the Emergency Department.   Take 1 tablet of prednisone with food 2 times daily for the next 7 days.  For pain control, take 650 mg of Tylenol every 6 hours or if your pain is severe you can take 1 tablet of Percocet every 8 hours for pain control.  Each tablet of Percocet has 325 mg of Tylenol.  You should not take more than 4000 mg of Tylenol from all sources in a 24-hour period.  Please make sure to avoid other medications or substances that can be too sleepy while you are taking Percocet because this medication can also make you drowsy.  You can use the crutches until you are able to bear weight on your feet.  Please call the number on your discharge paperwork to get established with a primary care provider.  Return to the emergency department if you develop a fever, worsening redness and swelling of the foot and toe despite taking these medications, if you start to have black, tarry stools, or develop other new, concerning symptoms.

## 2017-11-13 ENCOUNTER — Inpatient Hospital Stay: Payer: Self-pay | Admitting: Family Medicine

## 2017-12-07 ENCOUNTER — Ambulatory Visit: Payer: Self-pay | Admitting: Internal Medicine

## 2018-02-17 ENCOUNTER — Encounter (HOSPITAL_COMMUNITY): Payer: Self-pay | Admitting: Emergency Medicine

## 2018-02-17 ENCOUNTER — Emergency Department (HOSPITAL_COMMUNITY)
Admission: EM | Admit: 2018-02-17 | Discharge: 2018-02-17 | Disposition: A | Discharge status: Home / Self Care | Payer: Medicaid Other | Attending: Emergency Medicine | Admitting: Emergency Medicine

## 2018-02-17 ENCOUNTER — Other Ambulatory Visit: Payer: Self-pay

## 2018-02-17 DIAGNOSIS — L0291 Cutaneous abscess, unspecified: Secondary | ICD-10-CM

## 2018-02-17 DIAGNOSIS — L02612 Cutaneous abscess of left foot: Secondary | ICD-10-CM | POA: Insufficient documentation

## 2018-02-17 DIAGNOSIS — Z7982 Long term (current) use of aspirin: Secondary | ICD-10-CM | POA: Insufficient documentation

## 2018-02-17 DIAGNOSIS — I10 Essential (primary) hypertension: Secondary | ICD-10-CM | POA: Insufficient documentation

## 2018-02-17 DIAGNOSIS — Z79899 Other long term (current) drug therapy: Secondary | ICD-10-CM | POA: Insufficient documentation

## 2018-02-17 MED ORDER — LIDOCAINE-EPINEPHRINE (PF) 2 %-1:200000 IJ SOLN
20.0000 mL | Freq: Once | INTRAMUSCULAR | Status: AC
  Administered 2018-02-17: 20 mL via INTRADERMAL
  Filled 2018-02-17: qty 20

## 2018-02-17 MED ORDER — HYDROCHLOROTHIAZIDE 25 MG PO TABS
25.0000 mg | ORAL_TABLET | Freq: Every day | ORAL | 0 refills | Status: DC
Start: 2018-02-17 — End: 2018-03-15

## 2018-02-17 NOTE — Discharge Instructions (Signed)
You can take Tylenol or ibuprofen for pain Keep wound clean with warm soap and water and keep bandage dry, do not submerge in water for 24 hours. Change bandage sooner if it gets dirty Return for fever, increased redness, swelling, pain, or worsening drainage Have stitch removed in 7 days  Establish care with [facility] to have blood pressure rechecked

## 2018-02-17 NOTE — ED Provider Notes (Signed)
[facility] EMERGENCY DEPARTMENT Provider Note   CSN: [id number] Arrival date & time: 02/17/18  1626     History   Chief Complaint Chief Complaint  Patient presents with  . Foot Pain    HPI Evelyn Crawford is a 58 y.o. female who presents with left foot pain. PMH significant for alcohol abuse, tobacco abuse, anemia. She states that she's been having pain on her left foot for months. It is over the dorsal foot over the 1st MTP joint and associated with a growth over the joint. She was seen in the ED for the same in September and diagnosed clinically with gout. At that time her foot was red and swollen. She took prednisone as prescribed but states that she has had intermittent pain since then. She came today because the pain is worse and she isn't sure what the growth is. She did not f/u with a primary care provider because she is uninsured. No fever.  HPI  Past Medical History:  Diagnosis Date  . Alcohol abuse   . Iron deficiency anemia   . Thrombocytopenia ([place])   . Tobacco abuse   . Uterine fibroid     Patient Active Problem List   Diagnosis Date Noted  . THROMBOCYTOPENIA 11/22/2009  . FIBROIDS, UTERUS 11/15/2009  . ANEMIA, IRON DEFICIENCY 11/15/2009  . ALCOHOL ABUSE 11/15/2009  . TOBACCO ABUSE 11/15/2009    Past Surgical History:  Procedure Laterality Date  . caesarean section     x3  . CESAREAN SECTION     3X     OB History    Gravida  3   Para  3   Term  3   Preterm  0   AB  0   Living  3     SAB  0   TAB  0   Ectopic  0   Multiple  0   Live Births               Home Medications    Prior to Admission medications   Medication Sig Start Date End Date Taking? Authorizing Provider  albuterol (PROVENTIL HFA;VENTOLIN HFA) 108 (90 Base) MCG/ACT inhaler Inhale 1-2 puffs into the lungs every 6 (six) hours as needed for wheezing or shortness of breath. 05/14/17   Kinnie Feil, PA-C  aspirin 325 MG tablet Take 325 mg by  mouth every 6 (six) hours as needed for moderate pain.    [provider]  benzonatate (TESSALON PERLES) 100 MG capsule Take 1 capsule (100 mg total) by mouth 3 (three) times daily as needed for cough. FOR DISRUPTIVE DAY TIME COUGH 05/14/17   Kinnie Feil, PA-C  famotidine (PEPCID) 20 MG tablet Take 1 tablet (20 mg total) by mouth 2 (two) times daily. 01/23/15   Harvel Quale, MD  ferrous sulfate 325 (65 FE) MG tablet Take 325 mg by mouth 3 (three) times daily with meals. Reported on 01/23/2015    [provider]  hydroxypropyl methylcellulose / hypromellose (ISOPTO TEARS / GONIOVISC) 2.5 % ophthalmic solution Place 1 drop into both eyes 3 (three) times daily. 04/08/15   Delsa Grana, PA-C  naproxen (NAPROSYN) 500 MG tablet Take 1 tablet (500 mg total) by mouth 2 (two) times daily. 07/28/15   Law, Bea Graff, PA-C  oxyCODONE-acetaminophen (PERCOCET/ROXICET) 5-325 MG tablet Take 1 tablet by mouth every 8 (eight) hours as needed for severe pain. 10/22/17   McDonald, Laymond Purser, PA-C    Family History Family  History  Problem Relation Age of Onset  . Fibroids Mother   . Fibroids Sister     Social History Social History   Tobacco Use  . Smoking status: Current Every Day Smoker    Packs/day: 0.50    Types: Cigarettes  . Smokeless tobacco: Never Used  Substance Use Topics  . Alcohol use: Yes    Comment: daily 24 ounce beer every night  . Drug use: No     Allergies   Patient has no known allergies.   Review of Systems Review of Systems  Constitutional: Negative for fever.  Musculoskeletal: Positive for arthralgias.  Skin:       +growth  Neurological: Negative for weakness and numbness.     Physical Exam Updated Vital Signs BP (!) 157/91   Pulse 91   Temp 98.5 F (36.9 C) (Oral)   Resp 16   LMP 09/17/2010   SpO2 99%   Physical Exam Vitals signs and nursing note reviewed.  Constitutional:      General: She is not in acute distress.    Appearance: She  is well-developed.  HENT:     Head: Normocephalic and atraumatic.  Eyes:     General: No scleral icterus.       Right eye: No discharge.        Left eye: No discharge.     Conjunctiva/sclera: Conjunctivae normal.     Pupils: Pupils are equal, round, and reactive to light.  Neck:     Musculoskeletal: Normal range of motion.  Cardiovascular:     Rate and Rhythm: Normal rate.  Pulmonary:     Effort: Pulmonary effort is normal. No respiratory distress.  Abdominal:     General: There is no distension.  Musculoskeletal:     Comments: Left foot: Cystic appearing nodule over the 1st MTP joint. There appears to be an overlying abscess which is fluctuant which is very tender. No pain with ROM of the 1st MTP joint. N/V intact.   Skin:    General: Skin is warm and dry.  Neurological:     Mental Status: She is alert and oriented to person, place, and time.  Psychiatric:        Behavior: Behavior normal.      ED Treatments / Results  Labs (all labs ordered are listed, but only abnormal results are displayed) Labs Reviewed - No data to display  EKG None  Radiology No results found.  Procedures .Marland KitchenIncision and Drainage Date/Time: 02/17/2018 7:40 PM Performed by: Recardo Evangelist, PA-C Authorized by: Recardo Evangelist, PA-C   Consent:    Consent obtained:  Verbal   Consent given by:  Patient   Risks discussed:  Bleeding, incomplete drainage, pain and damage to other organs   Alternatives discussed:  No treatment Universal protocol:    Procedure explained and questions answered to patient or proxy's satisfaction: yes     Relevant documents present and verified: yes     Test results available and properly labeled: yes     Imaging studies available: yes     Required blood products, implants, devices, and special equipment available: yes     Site/side marked: yes     Immediately prior to procedure a time out was called: yes     Patient identity confirmed:  Verbally with  patient Location:    Type:  Abscess   Size:  2x2   Location:  Lower extremity   Lower extremity location:  Foot   Foot location:  L foot Pre-procedure details:    Skin preparation:  Betadine Anesthesia (see MAR for exact dosages):    Anesthesia method:  Local infiltration   Local anesthetic:  Lidocaine 2% WITH epi Procedure type:    Complexity:  Simple Procedure details:    Needle aspiration: no     Incision types:  Elliptical   Incision depth:  Subcutaneous   Scalpel blade:  11   Wound management:  Probed and deloculated, irrigated with saline and extensive cleaning   Drainage:  Purulent   Drainage amount:  Moderate   Wound treatment: 1 loose suture placed.   Packing materials:  None Post-procedure details:    Patient tolerance of procedure:  Tolerated well, no immediate complications   (including critical care time)    Medications Ordered in ED Medications - No data to display   Initial Impression / Assessment and Plan / ED Course  I have reviewed the triage vital signs and the nursing notes.  Pertinent labs & imaging results that were available during my care of the patient were reviewed by me and considered in my medical decision making (see chart for details).  58 year old female presents with left foot pain due to a skin nodule which appears to be a cyst with a superimposed abscess.  Incision and drainage was performed.  Purulent and cystic material was expressed however the core of the cyst was not able to be fully removed.  The cavity was copiously irrigated and 1 loose stitch was placed.  The patient has been persistently hypertensive here and on review of EMR she likely does have untreated hypertension.  We will give her a prescription for HCTZ.  She is a case management consult was placed and she was advised to follow-up with [facility] and wellness.  Wound care was discussed and she was advised to return for any concerns or worsening symptoms.  Final Clinical  Impressions(s) / ED Diagnoses   Final diagnoses:  Abscess  Hypertension, unspecified type    ED Discharge Orders    None       Recardo Evangelist, PA-C 02/17/18 1943    Maudie Flakes, MD 02/18/18 [phone]

## 2018-02-17 NOTE — ED Triage Notes (Signed)
Pt reports wound to top of R foot for approximately 1 month or more. Pt reports being seen here previously and told to follow-up, pt reports she never did. Pt has burning pain to R foot.

## 2018-02-22 ENCOUNTER — Telehealth: Payer: Self-pay

## 2018-02-22 NOTE — Telephone Encounter (Signed)
Message received from Fuller Mandril, RN,ED CM requesting a hospital follow up appointment for the patient at [facility]. Informed her that an appointment has been scheduled for 03/15/2018.

## 2018-03-15 ENCOUNTER — Encounter: Payer: Self-pay | Admitting: Family Medicine

## 2018-03-15 ENCOUNTER — Ambulatory Visit: Payer: Self-pay | Attending: Family Medicine | Admitting: Family Medicine

## 2018-03-15 VITALS — BP 124/89 | HR 75 | Temp 98.1°F | Resp 18 | Ht 67.0 in | Wt 168.0 lb

## 2018-03-15 DIAGNOSIS — I10 Essential (primary) hypertension: Secondary | ICD-10-CM

## 2018-03-15 DIAGNOSIS — Z720 Tobacco use: Secondary | ICD-10-CM

## 2018-03-15 DIAGNOSIS — L089 Local infection of the skin and subcutaneous tissue, unspecified: Secondary | ICD-10-CM

## 2018-03-15 DIAGNOSIS — L729 Follicular cyst of the skin and subcutaneous tissue, unspecified: Secondary | ICD-10-CM

## 2018-03-15 MED ORDER — HYDROCHLOROTHIAZIDE 25 MG PO TABS: 25.0000 mg | ORAL_TABLET | Freq: Every day | ORAL | 1 refills | Status: DC

## 2018-03-15 MED ORDER — AMOXICILLIN 875 MG PO TABS: 875.0000 mg | ORAL_TABLET | Freq: Two times a day (BID) | ORAL | 0 refills | Status: AC

## 2018-03-15 MED ORDER — ALBUTEROL SULFATE HFA 108 (90 BASE) MCG/ACT IN AERS: 1.0000 | INHALATION_SPRAY | Freq: Four times a day (QID) | RESPIRATORY_TRACT | 3 refills | Status: DC | PRN

## 2018-03-15 NOTE — Progress Notes (Signed)
Patient request refills on Inhaler and aspirin.

## 2018-03-15 NOTE — Patient Instructions (Signed)
DASH Eating Plan  DASH stands for "Dietary Approaches to Stop Hypertension." The DASH eating plan is a healthy eating plan that has been shown to reduce high blood pressure (hypertension). It may also reduce your risk for type 2 diabetes, heart disease, and stroke. The DASH eating plan may also help with weight loss.  What are tips for following this plan?    General guidelines   Avoid eating more than 2,300 mg (milligrams) of salt (sodium) a day. If you have hypertension, you may need to reduce your sodium intake to 1,500 mg a day.   Limit alcohol intake to no more than 1 drink a day for nonpregnant women and 2 drinks a day for men. One drink equals 12 oz of beer, 5 oz of wine, or 1 oz of hard liquor.   Work with your health care provider to maintain a healthy body weight or to lose weight. Ask what an ideal weight is for you.   Get at least 30 minutes of exercise that causes your heart to beat faster (aerobic exercise) most days of the week. Activities may include walking, swimming, or biking.   Work with your health care provider or diet and nutrition specialist (dietitian) to adjust your eating plan to your individual calorie needs.  Reading food labels     Check food labels for the amount of sodium per serving. Choose foods with less than 5 percent of the Daily Value of sodium. Generally, foods with less than 300 mg of sodium per serving fit into this eating plan.   To find whole grains, look for the word "whole" as the first word in the ingredient list.  Shopping   Buy products labeled as "low-sodium" or "no salt added."   Buy fresh foods. Avoid canned foods and premade or frozen meals.  Cooking   Avoid adding salt when cooking. Use salt-free seasonings or herbs instead of table salt or sea salt. Check with your health care provider or pharmacist before using salt substitutes.   Do not fry foods. Cook foods using healthy methods such as baking, boiling, grilling, and broiling instead.   Cook with  heart-healthy oils, such as olive, canola, soybean, or sunflower oil.  Meal planning   Eat a balanced diet that includes:  ? 5 or more servings of fruits and vegetables each day. At each meal, try to fill half of your plate with fruits and vegetables.  ? Up to 6-8 servings of whole grains each day.  ? Less than 6 oz of lean meat, poultry, or fish each day. A 3-oz serving of meat is about the same size as a deck of cards. One egg equals 1 oz.  ? 2 servings of low-fat dairy each day.  ? A serving of nuts, seeds, or beans 5 times each week.  ? Heart-healthy fats. Healthy fats called Omega-3 fatty acids are found in foods such as flaxseeds and coldwater fish, like sardines, salmon, and mackerel.   Limit how much you eat of the following:  ? Canned or prepackaged foods.  ? Food that is high in trans fat, such as fried foods.  ? Food that is high in saturated fat, such as fatty meat.  ? Sweets, desserts, sugary drinks, and other foods with added sugar.  ? Full-fat dairy products.   Do not salt foods before eating.   Try to eat at least 2 vegetarian meals each week.   Eat more home-cooked food and less restaurant, buffet, and fast food.     When eating at a restaurant, ask that your food be prepared with less salt or no salt, if possible.  What foods are recommended?  The items listed may not be a complete list. Talk with your dietitian about what dietary choices are best for you.  Grains  Whole-grain or whole-wheat bread. Whole-grain or whole-wheat pasta. Brown rice. Oatmeal. Quinoa. Bulgur. Whole-grain and low-sodium cereals. Pita bread. Low-fat, low-sodium crackers. Whole-wheat flour tortillas.  Vegetables  Fresh or frozen vegetables (raw, steamed, roasted, or grilled). Low-sodium or reduced-sodium tomato and vegetable juice. Low-sodium or reduced-sodium tomato sauce and tomato paste. Low-sodium or reduced-sodium canned vegetables.  Fruits  All fresh, dried, or frozen fruit. Canned fruit in natural juice (without  added sugar).  Meat and other protein foods  Skinless chicken or turkey. Ground chicken or turkey. Pork with fat trimmed off. Fish and seafood. Egg whites. Dried beans, peas, or lentils. Unsalted nuts, nut butters, and seeds. Unsalted canned beans. Lean cuts of beef with fat trimmed off. Low-sodium, lean deli meat.  Dairy  Low-fat (1%) or fat-free (skim) milk. Fat-free, low-fat, or reduced-fat cheeses. Nonfat, low-sodium ricotta or cottage cheese. Low-fat or nonfat yogurt. Low-fat, low-sodium cheese.  Fats and oils  Soft margarine without trans fats. Vegetable oil. Low-fat, reduced-fat, or light mayonnaise and salad dressings (reduced-sodium). Canola, safflower, olive, soybean, and sunflower oils. Avocado.  Seasoning and other foods  Herbs. Spices. Seasoning mixes without salt. Unsalted popcorn and pretzels. Fat-free sweets.  What foods are not recommended?  The items listed may not be a complete list. Talk with your dietitian about what dietary choices are best for you.  Grains  Baked goods made with fat, such as croissants, muffins, or some breads. Dry pasta or rice meal packs.  Vegetables  Creamed or fried vegetables. Vegetables in a cheese sauce. Regular canned vegetables (not low-sodium or reduced-sodium). Regular canned tomato sauce and paste (not low-sodium or reduced-sodium). Regular tomato and vegetable juice (not low-sodium or reduced-sodium). Pickles. Olives.  Fruits  Canned fruit in a light or heavy syrup. Fried fruit. Fruit in cream or butter sauce.  Meat and other protein foods  Fatty cuts of meat. Ribs. Fried meat. Bacon. Sausage. Bologna and other processed lunch meats. Salami. Fatback. Hotdogs. Bratwurst. Salted nuts and seeds. Canned beans with added salt. Canned or smoked fish. Whole eggs or egg yolks. Chicken or turkey with skin.  Dairy  Whole or 2% milk, cream, and half-and-half. Whole or full-fat cream cheese. Whole-fat or sweetened yogurt. Full-fat cheese. Nondairy creamers. Whipped toppings.  Processed cheese and cheese spreads.  Fats and oils  Butter. Stick margarine. Lard. Shortening. Ghee. Bacon fat. Tropical oils, such as coconut, palm kernel, or palm oil.  Seasoning and other foods  Salted popcorn and pretzels. Onion salt, garlic salt, seasoned salt, table salt, and sea salt. Worcestershire sauce. Tartar sauce. Barbecue sauce. Teriyaki sauce. Soy sauce, including reduced-sodium. Steak sauce. Canned and packaged gravies. Fish sauce. Oyster sauce. Cocktail sauce. Horseradish that you find on the shelf. Ketchup. Mustard. Meat flavorings and tenderizers. Bouillon cubes. Hot sauce and Tabasco sauce. Premade or packaged marinades. Premade or packaged taco seasonings. Relishes. Regular salad dressings.  Where to find more information:   National Heart, Lung, and Blood Institute: www.nhlbi.nih.gov   American Heart Association: www.heart.org  Summary   The DASH eating plan is a healthy eating plan that has been shown to reduce high blood pressure (hypertension). It may also reduce your risk for type 2 diabetes, heart disease, and stroke.   With the   DASH eating plan, you should limit salt (sodium) intake to 2,300 mg a day. If you have hypertension, you may need to reduce your sodium intake to 1,500 mg a day.   When on the DASH eating plan, aim to eat more fresh fruits and vegetables, whole grains, lean proteins, low-fat dairy, and heart-healthy fats.   Work with your health care provider or diet and nutrition specialist (dietitian) to adjust your eating plan to your individual calorie needs.  This information is not intended to replace advice given to you by your health care provider. Make sure you discuss any questions you have with your health care provider.  Document Released: 01/12/2011 Document Revised: 01/17/2016 Document Reviewed: 01/17/2016  Elsevier Interactive Patient Education  2019 Elsevier Inc.

## 2018-03-15 NOTE — Progress Notes (Signed)
Subjective:    Patient ID: Evelyn Crawford, female    DOB: [birth year], 58 y.o.   MRN: [id number]  HPI       58 yo female new to the practice who is seen status post emergency department visit on 02/17/2018 for left foot pain and abscess.  Patient was also diagnosed with hypertension and started on hydrochlorothiazide.  Patient reports that she continues to have some pain in her left foot as well as feeling as if she needs to be on an antibiotic due to continued infection in the area of her left foot that was opened and drained in the emergency department.  Pain in her toe ranges from a 3-4 but can increase to a 6 or more if she has been more active or when she is wearing shoes that put pressure on the area.      She has been taking the hydrochlorothiazide for her blood pressure.  She denies any headaches or dizziness related to her blood pressure.  She does not believe that the blood pressure medicine has caused any dizziness or muscle cramps.      She reports that she continues to smoke and occasionally has shortness of breath and would like to have an albuterol inhaler which she has used in the past.  She denies any current history of productive cough, wheezing or chest tightness.  She does not believe that she is ready to stop smoking at this time.  Past Medical History:  Diagnosis Date  . Alcohol abuse   . Iron deficiency anemia   . Thrombocytopenia ([place])   . Tobacco abuse   . Uterine fibroid    Past Surgical History:  Procedure Laterality Date  . caesarean section     x3  . CESAREAN SECTION     3X   Family History  Problem Relation Age of Onset  . Fibroids Mother   . Fibroids Sister    Social History   Tobacco Use  . Smoking status: Current Every Day Smoker    Packs/day: 0.50    Types: Cigarettes  . Smokeless tobacco: Never Used  Substance Use Topics  . Alcohol use: Yes    Comment: daily 24 ounce beer every night  . Drug use: No   No Known Allergies   Review of  Systems  Constitutional: Negative for chills, fatigue and fever.  HENT: Negative for sore throat and trouble swallowing.   Eyes: Negative for photophobia and visual disturbance.  Respiratory: Positive for shortness of breath (Occasional). Negative for cough, chest tightness and wheezing.   Cardiovascular: Negative for chest pain, palpitations and leg swelling.  Gastrointestinal: Negative for abdominal pain, constipation, diarrhea and nausea.  Endocrine: Negative for cold intolerance, heat intolerance, polydipsia, polyphagia and polyuria.  Genitourinary: Negative for dysuria and frequency.  Musculoskeletal: Positive for arthralgias and gait problem.  Neurological: Negative for dizziness and headaches.  Hematological: Negative for adenopathy. Does not bruise/bleed easily.       Objective:   Physical Exam Vitals signs and nursing note reviewed.  Constitutional:      Appearance: Normal appearance.  Neck:     Musculoskeletal: Neck supple. No muscular tenderness.  Cardiovascular:     Rate and Rhythm: Normal rate and regular rhythm.  Pulmonary:     Effort: Pulmonary effort is normal.     Breath sounds: Normal breath sounds. No wheezing or rhonchi.  Abdominal:     Tenderness: There is no abdominal tenderness. There is no right CVA tenderness, left CVA  tenderness, guarding or rebound.  Musculoskeletal:        General: Swelling and tenderness present.     Right lower leg: No edema.     Left lower leg: No edema.     Comments: Tenderness at area of a cyst/abscess over the left great toe MTP joint  Lymphadenopathy:     Cervical: No cervical adenopathy.  Skin:    General: Skin is warm and dry.     Findings: Lesion present.     Comments: Patient with a fluctuant abscess/cyst over the left great toe MTP joint which is slightly tender to palpation  Neurological:     General: No focal deficit present.     Mental Status: She is alert and oriented to person, place, and time.  Psychiatric:          Mood and Affect: Mood normal.        Behavior: Behavior normal.    BP 124/89 (BP Location: Right Arm, Patient Position: Sitting, Cuff Size: Normal)   Pulse 75   Temp 98.1 F (36.7 C) (Oral)   Resp 18   Ht 5\' 7"  (1.702 m)   Wt 168 lb (76.2 kg)   LMP 09/17/2010   SpO2 99%   BMI 26.31 kg/m         Assessment & Plan:  1. Essential hypertension Discussed blood pressure goal of 130/80 as well as DASH diet.  Patient will continue the use of hydrochlorothiazide.  Patient will have BMP at today's visit in follow-up of use of hydrochlorothiazide which can cause electrolyte abnormality such as low sodium, low potassium and increase in creatinine. - Basic Metabolic Panel - hydrochlorothiazide (HYDRODIURIL) 25 MG tablet; Take 1 tablet (25 mg total) by mouth daily. To lower blood pressure  Dispense: 90 tablet; Refill: 1  2. Infected cyst of skin Patient with infection/abscess at the left great toe MTP joint.  She will be placed on Augmentin 875 mg as well as being referred to podiatry for further evaluation and treatment. - amoxicillin (AMOXIL) 875 MG tablet; Take 1 tablet (875 mg total) by mouth 2 (two) times daily for 7 days.  Dispense: 14 tablet; Refill: 0 - Ambulatory referral to Podiatry  3. Tobacco use Patient with continued tobacco use and complaint of occasional shortness of breath.  Discussed smoking cessation but patient does not believe that she is ready to stop smoking at this time.  Prescription provided for albuterol to use as needed. - albuterol (PROVENTIL HFA;VENTOLIN HFA) 108 (90 Base) MCG/ACT inhaler; Inhale 1-2 puffs into the lungs every 6 (six) hours as needed for wheezing or shortness of breath.  Dispense: 1 Inhaler; Refill: 3  An After Visit Summary was printed and given to the patient.  Allergies as of 03/15/2018   No Known Allergies     Medication List       Accurate as of March 15, 2018 11:59 PM. If you have any questions, ask your nurse or doctor.         STOP taking these medications   benzonatate 100 MG capsule Commonly known as:  Best boy Stopped by:  Antony Blackbird, MD   famotidine 20 MG tablet Commonly known as:  Pepcid Stopped by:  Antony Blackbird, MD   naproxen 500 MG tablet Commonly known as:  NAPROSYN Stopped by:  Antony Blackbird, MD   oxyCODONE-acetaminophen 5-325 MG tablet Commonly known as:  PERCOCET/ROXICET Stopped by:  Antony Blackbird, MD     TAKE these medications   albuterol 108 (90  Base) MCG/ACT inhaler Commonly known as:  VENTOLIN HFA Inhale 1-2 puffs into the lungs every 6 (six) hours as needed for wheezing or shortness of breath.   amoxicillin 875 MG tablet Commonly known as:  AMOXIL Take 1 tablet (875 mg total) by mouth 2 (two) times daily for 7 days. Started by:  Antony Blackbird, MD   aspirin 325 MG tablet Take 325 mg by mouth every 6 (six) hours as needed for moderate pain.   ferrous sulfate 325 (65 FE) MG tablet Take 325 mg by mouth 3 (three) times daily with meals. Reported on 01/23/2015   hydrochlorothiazide 25 MG tablet Commonly known as:  HYDRODIURIL Take 1 tablet (25 mg total) by mouth daily. To lower blood pressure What changed:  additional instructions Changed by:  Antony Blackbird, MD   hydroxypropyl methylcellulose / hypromellose 2.5 % ophthalmic solution Commonly known as:  ISOPTO TEARS / GONIOVISC Place 1 drop into both eyes 3 (three) times daily.      Return in about 4 months (around 07/14/2018) for HTN.

## 2018-03-16 LAB — BASIC METABOLIC PANEL WITH GFR
BUN/Creatinine Ratio: 12 (ref 9–23)
BUN: 10 mg/dL (ref 6–24)
CO2: 23 mmol/L (ref 20–29)
Calcium: 10.6 mg/dL — ABNORMAL HIGH (ref 8.7–10.2)
Chloride: 99 mmol/L (ref 96–106)
Creatinine, Ser: 0.82 mg/dL (ref 0.57–1.00)
GFR calc Af Amer: 93 mL/min/1.73
GFR calc non Af Amer: 80 mL/min/1.73
Glucose: 105 mg/dL — ABNORMAL HIGH (ref 65–99)
Potassium: 3.6 mmol/L (ref 3.5–5.2)
Sodium: 138 mmol/L (ref 134–144)

## 2018-03-18 ENCOUNTER — Telehealth: Payer: Self-pay | Admitting: *Deleted

## 2018-03-18 NOTE — Telephone Encounter (Signed)
-----   Message from Antony Blackbird, MD sent at 03/16/2018  8:16 PM EST ----- Non-fasting glucose of 105 and a calcium of 10.6 (normal 8.7-10.2) on BMP

## 2018-03-18 NOTE — Telephone Encounter (Signed)
Medical Assistant left message on patient's home and cell voicemail. Voicemail states to give a call back to [place] with [facility] at [phone]. Please inform patient of labs being essentially normal and to limit sugar intake, Follow up at the next visit.

## 2018-04-22 ENCOUNTER — Emergency Department (HOSPITAL_COMMUNITY)
Admission: EM | Admit: 2018-04-22 | Discharge: 2018-04-22 | Disposition: A | Discharge status: Home / Self Care | Payer: Medicaid Other | Attending: Emergency Medicine | Admitting: Emergency Medicine

## 2018-04-22 ENCOUNTER — Encounter (HOSPITAL_COMMUNITY): Payer: Self-pay | Admitting: Emergency Medicine

## 2018-04-22 ENCOUNTER — Other Ambulatory Visit: Payer: Self-pay

## 2018-04-22 DIAGNOSIS — R059 Cough, unspecified: Secondary | ICD-10-CM

## 2018-04-22 DIAGNOSIS — F17210 Nicotine dependence, cigarettes, uncomplicated: Secondary | ICD-10-CM | POA: Insufficient documentation

## 2018-04-22 DIAGNOSIS — R0789 Other chest pain: Secondary | ICD-10-CM | POA: Insufficient documentation

## 2018-04-22 DIAGNOSIS — Z79899 Other long term (current) drug therapy: Secondary | ICD-10-CM | POA: Insufficient documentation

## 2018-04-22 DIAGNOSIS — R05 Cough: Secondary | ICD-10-CM | POA: Insufficient documentation

## 2018-04-22 LAB — I-STAT BETA HCG BLOOD, ED (MC, WL, AP ONLY): I-stat hCG, quantitative: 6.3 m[IU]/mL — ABNORMAL HIGH (ref <5)

## 2018-04-22 LAB — CBC
HCT: 43.2 % (ref 36.0–46.0)
Hemoglobin: 13.8 g/dL (ref 12.0–15.0)
MCH: 30.5 pg (ref 26.0–34.0)
MCHC: 31.9 g/dL (ref 30.0–36.0)
MCV: 95.6 fL (ref 80.0–100.0)
Platelets: 195 10*3/uL (ref 150–400)
RBC: 4.52 MIL/uL (ref 3.87–5.11)
RDW: 12.6 % (ref 11.5–15.5)
WBC: 8.5 10*3/uL (ref 4.0–10.5)
nRBC: 0 % (ref 0.0–0.2)

## 2018-04-22 LAB — BASIC METABOLIC PANEL
ANION GAP: 11 (ref 5–15)
BUN: 10 mg/dL (ref 6–20)
CALCIUM: 9.9 mg/dL (ref 8.9–10.3)
CO2: 27 mmol/L (ref 22–32)
Chloride: 99 mmol/L (ref 98–111)
Creatinine, Ser: 0.85 mg/dL (ref 0.44–1.00)
Glucose, Bld: 106 mg/dL — ABNORMAL HIGH (ref 70–99)
Potassium: 3.3 mmol/L — ABNORMAL LOW (ref 3.5–5.1)
Sodium: 137 mmol/L (ref 135–145)

## 2018-04-22 LAB — I-STAT TROPONIN, ED: TROPONIN I, POC: 0.01 ng/mL (ref 0.00–0.08)

## 2018-04-22 MED ORDER — SODIUM CHLORIDE 0.9% FLUSH: 3.0000 mL | Freq: Once | INTRAVENOUS | Status: DC

## 2018-04-22 MED ORDER — AEROCHAMBER PLUS FLO-VU LARGE MISC: 1.0000 | Freq: Once | Status: DC

## 2018-04-22 MED ORDER — BENZONATATE 100 MG PO CAPS: 100.0000 mg | ORAL_CAPSULE | Freq: Three times a day (TID) | ORAL | 0 refills | Status: DC

## 2018-04-22 MED ORDER — ALBUTEROL SULFATE HFA 108 (90 BASE) MCG/ACT IN AERS: 2.0000 | INHALATION_SPRAY | RESPIRATORY_TRACT | Status: DC | PRN

## 2018-04-22 NOTE — Discharge Instructions (Signed)
Use your albuterol inhaler 2 puffs every four hours.  Return if you are worse at any time.

## 2018-04-22 NOTE — ED Triage Notes (Signed)
Pt with non-productive cough since Friday. States "I have chest pain when I cough, bc I cough so hard." Denies fevers or shortness of breath.

## 2018-04-22 NOTE — ED Provider Notes (Signed)
[facility] EMERGENCY DEPARTMENT Provider Note   CSN: [id number] Arrival date & time: 04/22/18  1205    History   Chief Complaint Chief Complaint  Patient presents with  . Cough  . Chest Pain    HPI Evelyn Crawford is a 58 y.o. female.     HPI  57 year old female presents today complaining of cough for 4 days.  She denies fever, nasal congestion, chills, or dyspnea.  She states she gets this cough every year at this time.  She has not taken any medications for this.  She states she has cough medicine at home but is a year old and she did not want to use it.  She is a smoker and continues to smoke.  She denies any leg pain, history of PE, or DVT.  She states her sharp pain in her chest that occurs with coughing.  Denies any other chest pain.  Past Medical History:  Diagnosis Date  . Alcohol abuse   . Iron deficiency anemia   . Thrombocytopenia ([place])   . Tobacco abuse   . Uterine fibroid     Patient Active Problem List   Diagnosis Date Noted  . THROMBOCYTOPENIA 11/22/2009  . FIBROIDS, UTERUS 11/15/2009  . ANEMIA, IRON DEFICIENCY 11/15/2009  . ALCOHOL ABUSE 11/15/2009  . TOBACCO ABUSE 11/15/2009    Past Surgical History:  Procedure Laterality Date  . caesarean section     x3  . CESAREAN SECTION     3X     OB History    Gravida  3   Para  3   Term  3   Preterm  0   AB  0   Living  3     SAB  0   TAB  0   Ectopic  0   Multiple  0   Live Births               Home Medications    Prior to Admission medications   Medication Sig Start Date End Date Taking? Authorizing Provider  albuterol (PROVENTIL HFA;VENTOLIN HFA) 108 (90 Base) MCG/ACT inhaler Inhale 1-2 puffs into the lungs every 6 (six) hours as needed for wheezing or shortness of breath. 03/15/18   Fulp, Cammie, MD  aspirin 325 MG tablet Take 325 mg by mouth every 6 (six) hours as needed for moderate pain.    [provider]  ferrous sulfate 325 (65 FE) MG  tablet Take 325 mg by mouth 3 (three) times daily with meals. Reported on 01/23/2015    [provider]  hydrochlorothiazide (HYDRODIURIL) 25 MG tablet Take 1 tablet (25 mg total) by mouth daily. To lower blood pressure 03/15/18   Fulp, Cammie, MD  hydroxypropyl methylcellulose / hypromellose (ISOPTO TEARS / GONIOVISC) 2.5 % ophthalmic solution Place 1 drop into both eyes 3 (three) times daily. 04/08/15   Delsa Grana, PA-C    Family History Family History  Problem Relation Age of Onset  . Fibroids Mother   . Fibroids Sister     Social History Social History   Tobacco Use  . Smoking status: Current Every Day Smoker    Packs/day: 0.50    Types: Cigarettes  . Smokeless tobacco: Never Used  Substance Use Topics  . Alcohol use: Yes    Comment: daily 24 ounce beer every night  . Drug use: No     Allergies   Patient has no known allergies.   Review of Systems Review of Systems  All  other systems reviewed and are negative.    Physical Exam Updated Vital Signs BP (!) 146/102 (BP Location: Right Arm)   Pulse 92   Temp 98 F (36.7 C) (Oral)   Resp 18   LMP 09/17/2010   SpO2 97%   Physical Exam Vitals signs and nursing note reviewed.  Constitutional:      General: She is not in acute distress.    Appearance: She is well-developed. She is obese. She is not ill-appearing.  HENT:     Head: Normocephalic and atraumatic.  Eyes:     Pupils: Pupils are equal, round, and reactive to light.  Neck:     Musculoskeletal: Normal range of motion and neck supple.  Cardiovascular:     Rate and Rhythm: Normal rate and regular rhythm.     Heart sounds: Normal heart sounds.  Pulmonary:     Effort: Pulmonary effort is normal.     Comments: Faint end expiratory wheezes with good air movement Abdominal:     General: Bowel sounds are normal.     Palpations: Abdomen is soft.  Musculoskeletal: Normal range of motion.  Skin:    General: Skin is warm and dry.     Capillary  Refill: Capillary refill takes less than 2 seconds.  Neurological:     General: No focal deficit present.     Mental Status: She is alert.      ED Treatments / Results  Labs (all labs ordered are listed, but only abnormal results are displayed) Labs Reviewed  BASIC METABOLIC PANEL  CBC  I-STAT TROPONIN, ED  I-STAT BETA HCG BLOOD, ED ([facility], [facility], [facility] ONLY)    EKG EKG Interpretation  Date/Time:  Monday April 22 2018 12:09:23 EDT Ventricular Rate:  86 PR Interval:  164 QRS Duration: 70 QT Interval:  388 QTC Calculation: 464 R Axis:   23 Text Interpretation:  Normal sinus rhythm Possible Left atrial enlargement Borderline ECG Confirmed by Pattricia Boss [phone]) on 04/22/2018 12:49:46 PM   Radiology No results found.  Procedures Procedures (including critical care time)  Medications Ordered in ED Medications  sodium chloride flush (NS) 0.9 % injection 3 mL (has no administration in time range)     Initial Impression / Assessment and Plan / ED Course  I have reviewed the triage vital signs and the nursing notes.  Pertinent labs & imaging results that were available during my care of the patient were reviewed by me and considered in my medical decision making (see chart for details).        58 year old female smoker presents today with 4 days of cough.  On exam she does not appear to be volume overloaded.  She is not in any respiratory distress.  She does have some faint and expiratory wheezes.  She has no signs or symptoms of infection.  She reports having these symptoms nearly in the spring.  She is a smoker and this could be some reactive airway disease in association with seasonal allergies.  Appears stable to be discharged home.  Her troponin I was ordered by triage and is normal.  At given her symptoms, I am not concerned for ACS.  She also had an EKG done prior to my evaluation which is unchanged from prior.  She does have some hypertension and is somewhat hypertensive  here reports taking her medication at home.  I-STAT quantitative is slightly elevated at 6.3 she is postmenopausal and is likely within lab error.  Final Clinical Impressions(s) / ED Diagnoses  Final diagnoses:  Cough    ED Discharge Orders    None       Pattricia Boss, MD 04/22/18 1311

## 2018-07-07 ENCOUNTER — Encounter: Payer: Self-pay | Admitting: Family Medicine

## 2018-07-18 NOTE — Progress Notes (Signed)
Med refills, per pt she need to talk about her headaches every now and then.   Per pt she don't know if it's related to her glasses or not.  Per pt she is having problems getting enough sleep

## 2018-07-19 ENCOUNTER — Encounter: Payer: Self-pay | Admitting: Family Medicine

## 2018-07-19 ENCOUNTER — Other Ambulatory Visit: Payer: Self-pay

## 2018-07-19 ENCOUNTER — Ambulatory Visit: Payer: Self-pay | Attending: Family Medicine | Admitting: Family Medicine

## 2018-07-19 DIAGNOSIS — I10 Essential (primary) hypertension: Secondary | ICD-10-CM

## 2018-07-19 DIAGNOSIS — Z720 Tobacco use: Secondary | ICD-10-CM

## 2018-07-19 MED ORDER — ALBUTEROL SULFATE HFA 108 (90 BASE) MCG/ACT IN AERS: 1.0000 | INHALATION_SPRAY | Freq: Four times a day (QID) | RESPIRATORY_TRACT | 3 refills | Status: DC | PRN

## 2018-07-19 NOTE — Progress Notes (Signed)
Virtual Visit via Telephone Note  I connected with on 07/19/18 at  3:10 PM EDT by telephone and verified that I am speaking with the correct person using two identifiers.   I discussed the limitations, risks, security and privacy concerns of performing an evaluation and management service by telephone and the availability of in person appointments. I also discussed with the patient that there may be a patient responsible charge related to this service. The patient expressed understanding and agreed to proceed.  Patient Location: Home  Provider Location: Office Others participating in call: Emilio Aspen, RMA   History of Present Illness:       58 yo female seen in follow-up of hypertension and patient with long-term tobacco use and would like a refill of her albuterol inhaler to use as needed for shortness of breath/cough.  Patient states that she has been able to decrease down to 8 cigarettes/day.  She does not yet feel that she is ready to stop smoking completely.  She reports that she is having occasional very mild frontal headaches which are dull but otherwise feels that she is doing well.  She believes that her blood pressure has remained well controlled on the hydrochlorothiazide.  She denies any muscle cramping, no peripheral edema, no chest pain or palpitations.  She does have some occasional shortness of breath with exertion for which she uses the albuterol inhaler.  She does not have any difficulty lying down flat to sleep secondary to shortness of breath.  She has occasional mild nonproductive cough.  No abdominal pain- no nausea/vomiting/diarrhea or constipation.  Occasional mild fatigue.   Past Medical History:  Diagnosis Date  . Alcohol abuse   . Iron deficiency anemia   . Thrombocytopenia ([place])   . Tobacco abuse   . Uterine fibroid     Past Surgical History:  Procedure Laterality Date  . caesarean section     x3  . CESAREAN SECTION     3X    Family History  Problem  Relation Age of Onset  . Fibroids Mother   . Fibroids Sister     Social History   Tobacco Use  . Smoking status: Current Every Day Smoker    Packs/day: 0.50    Types: Cigarettes  . Smokeless tobacco: Never Used  Substance Use Topics  . Alcohol use: Yes    Comment: daily 24 ounce beer every night  . Drug use: No     No Known Allergies     Observations/Objective: No vital signs or physical exam conducted as visit was done via telephone  Assessment and Plan: 2. Tobacco use Patient counseled regarding the need for complete smoking cessation. She is not yet ready to quit but will continue to decrease her cigarette use.  She reports that she is down to about 8 cigarettes/day.  Patient declined current or future referral to clinical pharmacist regarding smoking cessation at this time. - albuterol (VENTOLIN HFA) 108 (90 Base) MCG/ACT inhaler; Inhale 1-2 puffs into the lungs every 6 (six) hours as needed for wheezing or shortness of breath.  Dispense: 1 Inhaler; Refill: 3  1. Essential hypertension She reports that her blood pressure has remained controlled.  She has some occasional very mild frontal headaches.  Patient will continue use of hydrochlorothiazide.  Patient did have mild decrease in potassium in the past to 3.3 and she is encouraged to have potassium rich foods for a few days such as bananas or orange juice if she has issues with muscle  cramping and of course if this continues she should come to clinic for further evaluation and treatment.  Follow Up Instructions:Return in about 5 months (around 12/19/2018) for HTN/tobacco use.    I discussed the assessment and treatment plan with the patient. The patient was provided an opportunity to ask questions and all were answered. The patient agreed with the plan and demonstrated an understanding of the instructions.   The patient was advised to call back or seek an in-person evaluation if the symptoms worsen or if the condition fails  to improve as anticipated.  I provided 8 minutes of non-face-to-face time during this encounter.   Antony Blackbird, MD

## 2018-09-05 ENCOUNTER — Other Ambulatory Visit: Payer: Self-pay | Admitting: Family Medicine

## 2018-09-05 DIAGNOSIS — I10 Essential (primary) hypertension: Secondary | ICD-10-CM

## 2018-11-18 ENCOUNTER — Emergency Department (HOSPITAL_COMMUNITY)
Admission: EM | Admit: 2018-11-18 | Discharge: 2018-11-18 | Disposition: A | Discharge status: Home / Self Care | Payer: Medicaid Other | Attending: Emergency Medicine | Admitting: Emergency Medicine

## 2018-11-18 ENCOUNTER — Encounter (HOSPITAL_COMMUNITY): Payer: Self-pay

## 2018-11-18 ENCOUNTER — Other Ambulatory Visit: Payer: Self-pay

## 2018-11-18 ENCOUNTER — Telehealth: Payer: Self-pay | Admitting: Family Medicine

## 2018-11-18 DIAGNOSIS — F17210 Nicotine dependence, cigarettes, uncomplicated: Secondary | ICD-10-CM | POA: Insufficient documentation

## 2018-11-18 DIAGNOSIS — M7918 Myalgia, other site: Secondary | ICD-10-CM

## 2018-11-18 DIAGNOSIS — Z7982 Long term (current) use of aspirin: Secondary | ICD-10-CM | POA: Insufficient documentation

## 2018-11-18 DIAGNOSIS — G5702 Lesion of sciatic nerve, left lower limb: Secondary | ICD-10-CM

## 2018-11-18 DIAGNOSIS — Z79899 Other long term (current) drug therapy: Secondary | ICD-10-CM | POA: Insufficient documentation

## 2018-11-18 DIAGNOSIS — I10 Essential (primary) hypertension: Secondary | ICD-10-CM | POA: Insufficient documentation

## 2018-11-18 DIAGNOSIS — M5432 Sciatica, left side: Secondary | ICD-10-CM | POA: Insufficient documentation

## 2018-11-18 MED ORDER — CYCLOBENZAPRINE HCL 10 MG PO TABS: 10.0000 mg | ORAL_TABLET | Freq: Every evening | ORAL | 0 refills | Status: DC | PRN

## 2018-11-18 NOTE — Telephone Encounter (Signed)
Patient called wanting to know if she could be prescribed something for pain. Patient states that she is having pain in her left leg.states that it is very painful  Please follow up.

## 2018-11-18 NOTE — Telephone Encounter (Signed)
Per pt she have not tried OTC pain meds yet. Per pt her friend gived her 800 mg Ibuprofen and it works. Per pt she states the Ibuprofen is not helping either.

## 2018-11-18 NOTE — ED Triage Notes (Signed)
Patient complains of lower back pain with radiation AND burning down left leg. States she does a lot of standing for work. No trauma

## 2018-11-18 NOTE — ED Provider Notes (Signed)
[facility] EMERGENCY DEPARTMENT Provider Note   CSN: [id number] Arrival date & time: 11/18/18  1318     History   Chief Complaint Chief Complaint  Patient presents with  . Back Pain    HPI Evelyn Crawford is a 58 y.o. female with a past medical history of hypertension, tobacco abuse, who presents today for evaluation of left-sided buttock pain.  She denies any trauma or recent injections.  She reports that this is been worsening over the past 4 days.  She has attempted icy hot at home without significant relief.  She states that the pain is been keeping her from sleeping.  It is made better with getting up and walking around.  She took 600 mg of ibuprofen without full relief of her pain.  Denies any personal history of cancer or IV drug use.  No fevers.  She denies any changes to bowel or bladder function or saddle anesthesias/paresthesias.  She denies any back or abdominal pain. The pain from her left buttock shoots down her left leg into her toes.  She does not have a history of similar.      HPI  Past Medical History:  Diagnosis Date  . Alcohol abuse   . Iron deficiency anemia   . Thrombocytopenia ([place])   . Tobacco abuse   . Uterine fibroid     Patient Active Problem List   Diagnosis Date Noted  . FIBROIDS, UTERUS 11/15/2009  . ANEMIA, IRON DEFICIENCY 11/15/2009  . ALCOHOL ABUSE 11/15/2009  . TOBACCO ABUSE 11/15/2009    Past Surgical History:  Procedure Laterality Date  . caesarean section     x3  . CESAREAN SECTION     3X     OB History    Gravida  3   Para  3   Term  3   Preterm  0   AB  0   Living  3     SAB  0   TAB  0   Ectopic  0   Multiple  0   Live Births               Home Medications    Prior to Admission medications   Medication Sig Start Date End Date Taking? Authorizing Provider  albuterol (VENTOLIN HFA) 108 (90 Base) MCG/ACT inhaler Inhale 1-2 puffs into the lungs every 6 (six) hours as needed for  wheezing or shortness of breath. 07/19/18   Fulp, Cammie, MD  aspirin 325 MG tablet Take 325 mg by mouth every 6 (six) hours as needed for moderate pain.    [provider]  benzonatate (TESSALON) 100 MG capsule Take 1 capsule (100 mg total) by mouth every 8 (eight) hours. Patient not taking: Reported on 07/19/2018 04/22/18   Pattricia Boss, MD  cyclobenzaprine (FLEXERIL) 10 MG tablet Take 1 tablet (10 mg total) by mouth at bedtime as needed for muscle spasms. 11/18/18   Lorin Glass, PA-C  ferrous sulfate 325 (65 FE) MG tablet Take 325 mg by mouth 3 (three) times daily with meals. Reported on 01/23/2015    [provider]  hydrochlorothiazide (HYDRODIURIL) 25 MG tablet TAKE 1 TABLET BY MOUTH DAILY. TO LOWER BLOOD PRESSURE 09/05/18   Fulp, Cammie, MD  hydroxypropyl methylcellulose / hypromellose (ISOPTO TEARS / GONIOVISC) 2.5 % ophthalmic solution Place 1 drop into both eyes 3 (three) times daily. Patient not taking: Reported on 07/19/2018 04/08/15   Delsa Grana, PA-C    Family History Family History  Problem Relation Age of Onset  . Fibroids Mother   . Fibroids Sister     Social History Social History   Tobacco Use  . Smoking status: Current Every Day Smoker    Packs/day: 0.50    Types: Cigarettes  . Smokeless tobacco: Never Used  Substance Use Topics  . Alcohol use: Yes    Comment: daily 24 ounce beer every night  . Drug use: No     Allergies   Patient has no known allergies.   Review of Systems Review of Systems  Constitutional: Negative for chills and fever.  Gastrointestinal: Negative for abdominal pain and nausea.  Genitourinary: Negative for dysuria, frequency and urgency.  Musculoskeletal: Negative for back pain and neck pain.       Left-sided buttocks pain radiating down the outside of her left leg to her toes.  Neurological: Negative for weakness.  All other systems reviewed and are negative.    Physical Exam Updated Vital Signs BP  128/86   Pulse 64   Temp 98.7 F (37.1 C) (Oral)   Resp 15   LMP 09/17/2010   SpO2 99%   Physical Exam Vitals signs and nursing note reviewed.  Constitutional:      General: She is not in acute distress.    Appearance: She is not ill-appearing.  HENT:     Head: Normocephalic.  Cardiovascular:     Rate and Rhythm: Normal rate.     Comments: 2+ DP/PT pulses bilaterally. Pulmonary:     Effort: Pulmonary effort is normal. No respiratory distress.  Abdominal:     General: There is no distension.  Musculoskeletal:     Comments: T/L-spine palpate it without midline tenderness palpation, step-offs, or deformities.  There is tenderness to palpation in the left buttock over the piriformis muscle.  Palpation here both re-creates and exacerbates her reported pain and radiation down her left leg.  Neurological:     Mental Status: She is alert.     Comments: 5/5 strength bilateral lower extremities.  Sensation to bilateral lower extremities is intact to light touch.  Psychiatric:        Mood and Affect: Mood normal.      ED Treatments / Results  Labs (all labs ordered are listed, but only abnormal results are displayed) Labs Reviewed - No data to display  EKG None  Radiology No results found.  Procedures Procedures (including critical care time)  Medications Ordered in ED Medications - No data to display   Initial Impression / Assessment and Plan / ED Course  I have reviewed the triage vital signs and the nursing notes.  Pertinent labs & imaging results that were available during my care of the patient were reviewed by me and considered in my medical decision making (see chart for details).        Patient presents today for evaluation of atraumatic left-sided buttocks pain radiating down her left leg for the past 4 days.  She denies any injury.  No back pain.  Physical exam of lumbar back is unremarkable.  She does have tenderness to palpation over the left-sided  buttock/piriformis muscle.  Palpation here both re-creates and exacerbates her reported radiation down her left leg.  She is neurovascularly intact on my exam.  She does not have any abdominal pain or back pain to make me concerned of AAA or other serious intra-abdominal cause.  She denies any changes to bowel or bladder function.  No saddle paresthesias or anesthesia.  No personal history  of IV drug use or cancer.  Do not suspect cauda equina syndrome.  I offered her x-rays which she made the informed decision to refuse.  I suspect that her pain is related to left-sided piriformis syndrome.  As she states that the pain is keeping her awake at night we will give a prescription for Flexeril.  We discussed conservative measures in addition such as gentle stretching, heat, OTC meds.  We discussed that Flexeril may make her drowsy and sleepy and she should not drive or operate heavy machinery or perform other potentially dangerous tasks while taking this medication.  Recommended PCP follow-up.  Return precautions were discussed with patient who states their understanding.  At the time of discharge patient denied any unaddressed complaints or concerns.  Patient is agreeable for discharge home.   Final Clinical Impressions(s) / ED Diagnoses   Final diagnoses:  Piriformis syndrome of left side  Left buttock pain    ED Discharge Orders         Ordered    cyclobenzaprine (FLEXERIL) 10 MG tablet  At bedtime PRN     11/18/18 1641           Lorin Glass, Hershal Coria 11/18/18 2307    Virgel Manifold, MD 11/23/18 1112

## 2018-11-18 NOTE — Discharge Instructions (Addendum)
Please take Tylenol (acetaminophen) to relieve your pain.  You may take tylenol, up to 1,000 mg (two extra strength pills).  Do not take more than 3,000 mg tylenol in a 24 hour period.  Please check all medication labels as many medications such as pain and cold medications may contain tylenol. Please do not drink alcohol while taking this medication.   Please schedule a follow-up appointment with your primary care doctor in the next 1 to 2 days.  If your symptoms worsen, you develop fevers, weakness, changes to bowel or bladder function or numbness over your genital area, or have any concerns please return to the emergency room.  You are being prescribed a medication which may make you sleepy. For 24 hours after one dose please do not drive, operate heavy machinery, care for a small child with out another adult present, or perform any activities that may cause harm to you or someone else if you were to fall asleep or be impaired.

## 2018-11-18 NOTE — Telephone Encounter (Signed)
It looks like she may currently be at the ED. She has not been seen since June and needs evaluation of her leg pain so if she is not currently at the ED then you can schedule her for an office visit with myself or another provider- who ever can see her for an acute visit. Also it sounds like she gets some relief of pain with the 800 mg ibuprofen that her friend gives her?

## 2018-11-18 NOTE — ED Notes (Signed)
Patient verbalizes understanding of discharge instructions. Opportunity for questioning and answers were provided. Armband removed by staff, pt discharged from ED ambulatory to home.  

## 2018-11-19 NOTE — Telephone Encounter (Signed)
LMOM

## 2018-11-19 NOTE — Telephone Encounter (Signed)
Spoke with patient and she stated she went to the Emergency Department and they told her what was wrong with her leg. Per pt she was given pain med for 7 days and have F/U with PCP for November 5th. Patient agreed and verbalized understanding.

## 2018-12-03 ENCOUNTER — Other Ambulatory Visit: Payer: Self-pay | Admitting: Family Medicine

## 2018-12-03 DIAGNOSIS — I10 Essential (primary) hypertension: Secondary | ICD-10-CM

## 2018-12-09 ENCOUNTER — Ambulatory Visit: Payer: Medicaid Other | Admitting: Family Medicine

## 2018-12-12 ENCOUNTER — Ambulatory Visit: Payer: Self-pay | Attending: Family Medicine | Admitting: Family Medicine

## 2018-12-12 ENCOUNTER — Encounter: Payer: Self-pay | Admitting: Family Medicine

## 2018-12-12 ENCOUNTER — Other Ambulatory Visit: Payer: Self-pay

## 2018-12-12 DIAGNOSIS — M545 Low back pain, unspecified: Secondary | ICD-10-CM

## 2018-12-12 DIAGNOSIS — Z1231 Encounter for screening mammogram for malignant neoplasm of breast: Secondary | ICD-10-CM

## 2018-12-12 MED ORDER — GABAPENTIN 100 MG PO CAPS: ORAL_CAPSULE | ORAL | 2 refills | Status: DC

## 2018-12-12 MED ORDER — PREDNISONE 20 MG PO TABS: ORAL_TABLET | ORAL | 0 refills | Status: DC

## 2018-12-12 MED ORDER — TRAMADOL HCL 50 MG PO TABS: 50.0000 mg | ORAL_TABLET | Freq: Four times a day (QID) | ORAL | 0 refills | Status: AC | PRN

## 2018-12-12 NOTE — Progress Notes (Signed)
Virtual Visit via Telephone Note  I connected with Daise Giovanelli on 12/12/18 at  3:10 PM EST by telephone and verified that I am speaking with the correct person using two identifiers.   I discussed the limitations, risks, security and privacy concerns of performing an evaluation and management service by telephone and the availability of in person appointments. I also discussed with the patient that there may be a patient responsible charge related to this service. The patient expressed understanding and agreed to proceed.  Patient Location: Home Provider Location: [facility] Others participating in call: none   History of Present Illness:      58 year old female who reports that for the past 3 or more weeks she has had onset of pain in her left leg which goes down into her buttock and radiates down to her ankle.  Pain is sharp and stabbing in nature.  She denies any injury prior to onset of the pain.  She did go to the emergency department on 11/18/2018 and was given a prescription for muscle relaxant but she states that other than making her drowsy/helping her to fall asleep, this medication did not really help her pain.  Over-the-counter pain medications are not helping.  She has had no current issues with loss of bowel or bladder function.  She sometimes has no pain or minimal pain at rest but when she attempts to stand up from a seated position or if she has been in her car and then attempts to get out and stand up she has onset of the shooting pain.  Pain ranges between a 6-8 on a 0-to-10 scale and sometimes as great as a 10.  She at times has difficulty sleeping secondary to inability to find a comfortable position due to the leg pain.  She has had no fever or chills, no rash or skin abnormality, no chest pain or palpitations, no shortness of breath or cough.  No abdominal pain-no nausea or vomiting.  No urinary frequency, urgency or dysuria.   Past Medical History:  Diagnosis Date  .  Alcohol abuse   . Iron deficiency anemia   . Thrombocytopenia ([place])   . Tobacco abuse   . Uterine fibroid     Past Surgical History:  Procedure Laterality Date  . caesarean section     x3  . CESAREAN SECTION     3X    Family History  Problem Relation Age of Onset  . Fibroids Mother   . Fibroids Sister     Social History   Tobacco Use  . Smoking status: Current Every Day Smoker    Packs/day: 0.50    Types: Cigarettes  . Smokeless tobacco: Never Used  Substance Use Topics  . Alcohol use: Yes    Comment: daily 24 ounce beer every night  . Drug use: No     No Known Allergies     Observations/Objective: No vital signs or physical exam conducted as visit was done via telephone  Assessment and Plan: 1. Low back pain with radiation Per emergency department notes, patient was diagnosed with sciatica and possible piriformis syndrome as a cause of her pain which radiates from the left buttock to the ankle.  She may continue the use of Flexeril at bedtime to help with initiation of sleep/muscle spasm and prescription provided for tramadol for patient to take as needed for pain.  She will additionally be referred to orthopedics for further evaluation and treatment. - traMADol (ULTRAM) 50 MG tablet; Take 1  tablet (50 mg total) by mouth every 6 (six) hours as needed for up to 5 days.  Dispense: 15 tablet; Refill: 0 - AMB referral to orthopedics  2. Encounter for screening mammogram for malignant neoplasm of breast On review of care gaps, patient is due for mammogram and she agrees to have order placed for screening mammogram as a screening test for breast cancer. - MM Digital Screening; Future  Follow Up Instructions:Return for back pain- as needed; schedule annual well exam.    I discussed the assessment and treatment plan with the patient. The patient was provided an opportunity to ask questions and all were answered. The patient agreed with the plan and demonstrated an  understanding of the instructions.   The patient was advised to call back or seek an in-person evaluation if the symptoms worsen or if the condition fails to improve as anticipated.  I provided 10 minutes of non-face-to-face time during this encounter.   Antony Blackbird, MD

## 2018-12-12 NOTE — Progress Notes (Signed)
Patient verified DOB Patient complains of buttocks pain radiating down to her calf.  Patient has used the flexeril with no relief. Patient is currently using ibuprofen 800 during the day to help minimize the pain to work. Patient has eaten today. Patient has taken ibuprofen.

## 2018-12-19 ENCOUNTER — Other Ambulatory Visit: Payer: Self-pay

## 2018-12-19 ENCOUNTER — Emergency Department (HOSPITAL_COMMUNITY)
Admission: EM | Admit: 2018-12-19 | Discharge: 2018-12-20 | Disposition: A | Discharge status: Home / Self Care | Payer: Medicaid Other | Attending: Emergency Medicine | Admitting: Emergency Medicine

## 2018-12-19 ENCOUNTER — Encounter (HOSPITAL_COMMUNITY): Payer: Self-pay | Admitting: Emergency Medicine

## 2018-12-19 DIAGNOSIS — D649 Anemia, unspecified: Secondary | ICD-10-CM | POA: Insufficient documentation

## 2018-12-19 DIAGNOSIS — Z7982 Long term (current) use of aspirin: Secondary | ICD-10-CM | POA: Insufficient documentation

## 2018-12-19 DIAGNOSIS — F17210 Nicotine dependence, cigarettes, uncomplicated: Secondary | ICD-10-CM | POA: Insufficient documentation

## 2018-12-19 DIAGNOSIS — I10 Essential (primary) hypertension: Secondary | ICD-10-CM | POA: Insufficient documentation

## 2018-12-19 DIAGNOSIS — M5432 Sciatica, left side: Secondary | ICD-10-CM

## 2018-12-19 HISTORY — DX: Essential (primary) hypertension: I10

## 2018-12-19 NOTE — ED Triage Notes (Signed)
Patient with left leg pain.  She states that she has been here for this before.  She states that she has been taking meds without relief of her pain.  She spoke with regular doctor and she thinks maybe a pinched nerve. Patient was given more meds that are not helping.

## 2018-12-20 MED ORDER — OXYCODONE-ACETAMINOPHEN 5-325 MG PO TABS: 1.0000 | ORAL_TABLET | Freq: Four times a day (QID) | ORAL | 0 refills | Status: DC | PRN

## 2018-12-20 MED ORDER — DICLOFENAC SODIUM 1 % EX GEL: 2.0000 g | Freq: Four times a day (QID) | CUTANEOUS | 0 refills | Status: DC

## 2018-12-20 MED ORDER — OXYCODONE-ACETAMINOPHEN 5-325 MG PO TABS
2.0000 | ORAL_TABLET | Freq: Once | ORAL | Status: AC
  Administered 2018-12-20: 04:00:00 2 via ORAL
  Filled 2018-12-20: qty 2

## 2018-12-20 MED ORDER — KETOROLAC TROMETHAMINE 30 MG/ML IJ SOLN
30.0000 mg | Freq: Once | INTRAMUSCULAR | Status: DC
  Filled 2018-12-20: qty 1

## 2018-12-20 NOTE — ED Notes (Signed)
ED Provider at bedside. 

## 2018-12-20 NOTE — ED Notes (Signed)
Pt given crackers, water, and gingerale at this time. Pt refuses the toradol injection at this time. Provider made aware.

## 2018-12-20 NOTE — ED Notes (Signed)
Pt reports dizziness at this time. Denies nausea. She also reported that she took the percocet on an empty stomach besides the one cracker she ate prior to taking the meds. Provider made aware.

## 2018-12-20 NOTE — Discharge Instructions (Addendum)
Use ice 3-4 times daily alternating 20 minutes on, 20 minutes off.  For severe pain you can take 1-2 Percocet every 6 hours.  Stop taking tramadol.  Apply Voltaren gel 4 times to the painful areas.  Continue taking gabapentin, however begin taking 300 mg twice a day for 1 day and then begin taking 300 mg 3 times daily from then forward.  Please follow-up with your doctor and/or the orthopedic doctor below for further evaluation and treatment of your suspected sciatica.  Please return to the emergency department if you develop any new or worsening symptoms including complete numbness of your legs or groin, loss of bowel or bladder control, or any other concerning symptoms.  Do not drink alcohol, drive, operate machinery or participate in any other potentially dangerous activities while taking opiate pain medication as it may make you sleepy. Do not take this medication with any other sedating medications, either prescription or over-the-counter. If you were prescribed Percocet or Vicodin, do not take these with acetaminophen (Tylenol) as it is already contained within these medications and overdose of Tylenol is dangerous.   This medication is an opiate (or narcotic) pain medication and can be habit forming.  Use it as little as possible to achieve adequate pain control.  Do not use or use it with extreme caution if you have a history of opiate abuse or dependence. This medication is intended for your use only - do not give any to anyone else and keep it in a secure place where nobody else, especially children, have access to it. It will also cause or worsen constipation, so you may want to consider taking an over-the-counter stool softener while you are taking this medication.

## 2018-12-20 NOTE — ED Provider Notes (Signed)
[facility] EMERGENCY DEPARTMENT Provider Note   CSN: [id number] Arrival date & time: 12/19/18  2321     History   Chief Complaint Chief Complaint  Patient presents with  . Leg Pain    HPI Evelyn Crawford is a 58 y.o. female with history of hypertension, thrombocytopenia, alcohol abuse who presents with a 1 month history of left leg pain.  Patient describes her pain starting her buttocks and shooting down to her ankle.  It is getting much worse and keeping her awake at night.  She has tried a prednisone taper, low-dose gabapentin, and tramadol without relief.  She has been taking these medications for 6 days.  Patient denies any numbness or tingling, saddle anesthesia, loss of bowel or bladder control, history of cancer, procedure to back, IVDU.     HPI  Past Medical History:  Diagnosis Date  . Alcohol abuse   . Hypertension   . Iron deficiency anemia   . Thrombocytopenia ([place])   . Tobacco abuse   . Uterine fibroid     Patient Active Problem List   Diagnosis Date Noted  . FIBROIDS, UTERUS 11/15/2009  . ANEMIA, IRON DEFICIENCY 11/15/2009  . ALCOHOL ABUSE 11/15/2009  . TOBACCO ABUSE 11/15/2009    Past Surgical History:  Procedure Laterality Date  . caesarean section     x3  . CESAREAN SECTION     3X     OB History    Gravida  3   Para  3   Term  3   Preterm  0   AB  0   Living  3     SAB  0   TAB  0   Ectopic  0   Multiple  0   Live Births               Home Medications    Prior to Admission medications   Medication Sig Start Date End Date Taking? Authorizing Provider  albuterol (VENTOLIN HFA) 108 (90 Base) MCG/ACT inhaler Inhale 1-2 puffs into the lungs every 6 (six) hours as needed for wheezing or shortness of breath. 07/19/18   Fulp, Cammie, MD  aspirin 325 MG tablet Take 325 mg by mouth every 6 (six) hours as needed for moderate pain.    [provider]  cyclobenzaprine (FLEXERIL) 10 MG tablet Take 1  tablet (10 mg total) by mouth at bedtime as needed for muscle spasms. 11/18/18   Lorin Glass, PA-C  diclofenac Sodium (VOLTAREN) 1 % GEL Apply 2 g topically 4 (four) times daily. 12/20/18   Brynleigh Sequeira, Bea Graff, PA-C  ferrous sulfate 325 (65 FE) MG tablet Take 325 mg by mouth 3 (three) times daily with meals. Reported on 01/23/2015    [provider]  gabapentin (NEURONTIN) 100 MG capsule One or two at bedtime as needed for nerve pain 12/12/18   Fulp, Cammie, MD  hydrochlorothiazide (HYDRODIURIL) 25 MG tablet Take 1 tablet (25 mg total) by mouth daily. Must keep upcoming office visit for refills 12/03/18   Fulp, Cammie, MD  oxyCODONE-acetaminophen (PERCOCET/ROXICET) 5-325 MG tablet Take 1-2 tablets by mouth every 6 (six) hours as needed for severe pain. 12/20/18   Barnes Florek, Bea Graff, PA-C  predniSONE (DELTASONE) 20 MG tablet Take 3 pills today then 2 pills daily for 2 days, 1 pill daily x 2 days then 1/2 pill x 2 days; take after eating 12/12/18   Fulp, Cammie, MD  traMADol (ULTRAM) 50 MG tablet Take 1 tablet (  50 mg total) by mouth every 6 (six) hours as needed for up to 20 days. 12/12/18 01/01/19  Antony Blackbird, MD    Family History Family History  Problem Relation Age of Onset  . Fibroids Mother   . Fibroids Sister     Social History Social History   Tobacco Use  . Smoking status: Current Every Day Smoker    Packs/day: 0.50    Types: Cigarettes  . Smokeless tobacco: Never Used  Substance Use Topics  . Alcohol use: Yes    Comment: daily 24 ounce beer every night  . Drug use: No     Allergies   Patient has no known allergies.   Review of Systems Review of Systems  Constitutional: Negative for fever.  Genitourinary: Negative for difficulty urinating.  Musculoskeletal: Positive for myalgias. Negative for back pain.  Neurological: Negative for numbness.     Physical Exam Updated Vital Signs BP 135/84 (BP Location: Right Arm)   Pulse 60   Temp 98.4 F (36.9 C)  (Oral)   Resp 20   LMP 09/17/2010   SpO2 97%   Physical Exam Vitals signs and nursing note reviewed.  Constitutional:      General: She is not in acute distress.    Appearance: She is well-developed. She is not diaphoretic.  HENT:     Head: Normocephalic and atraumatic.     Mouth/Throat:     Pharynx: No oropharyngeal exudate.  Eyes:     General: No scleral icterus.       Right eye: No discharge.        Left eye: No discharge.     Conjunctiva/sclera: Conjunctivae normal.     Pupils: Pupils are equal, round, and reactive to light.  Neck:     Musculoskeletal: Normal range of motion and neck supple.     Thyroid: No thyromegaly.  Cardiovascular:     Rate and Rhythm: Normal rate and regular rhythm.     Heart sounds: Normal heart sounds. No murmur. No friction rub. No gallop.   Pulmonary:     Effort: Pulmonary effort is normal. No respiratory distress.     Breath sounds: Normal breath sounds. No stridor. No wheezing or rales.  Abdominal:     General: Bowel sounds are normal. There is no distension.     Palpations: Abdomen is soft.     Tenderness: There is no abdominal tenderness. There is no guarding or rebound.  Musculoskeletal:     Right lower leg: No edema.     Left lower leg: No edema.       Legs:     Comments: No tenderness in the lumbar spine, tenderness begins at the sciatic notch and runs to the posterior leg to the ankle 5/5 strength and normal sensation to bilateral lower extremities  Lymphadenopathy:     Cervical: No cervical adenopathy.  Skin:    General: Skin is warm and dry.     Coloration: Skin is not pale.     Findings: No rash.  Neurological:     Mental Status: She is alert.     Coordination: Coordination normal.      ED Treatments / Results  Labs (all labs ordered are listed, but only abnormal results are displayed) Labs Reviewed - No data to display  EKG None  Radiology No results found.  Procedures Procedures (including critical care time)   Medications Ordered in ED Medications  ketorolac (TORADOL) 30 MG/ML injection 30 mg (30 mg Intramuscular Not Given 12/20/18  0530)  oxyCODONE-acetaminophen (PERCOCET/ROXICET) 5-325 MG per tablet 2 tablet (2 tablets Oral Given 12/20/18 0413)     Initial Impression / Assessment and Plan / ED Course  I have reviewed the triage vital signs and the nursing notes.  Pertinent labs & imaging results that were available during my care of the patient were reviewed by me and considered in my medical decision making (see chart for details).        Patient with suspected sciatica.  Normal neuro exam without focal deficits.  No saddle anesthesia or bowel or bladder incontinence.  There is no bony tenderness indicating imaging at this time.  Patient's exam not consistent with DVT.  No signs of cauda equina.  Patient has is about to finish a prednisone taper.  We will try increasing dose of gabapentin as she is only taking 100 mg at bedtime.  Patient advised of taper dosing.  Also try Voltaren gel and very short course of Percocet instead of tramadol as patient is getting no relief from this.  Follow-up with PCP and orthopedics also given.  Return precautions discussed.  Patient understands and agrees with plan.  Patient vitals stable throughout ED course and discharged in satisfactory condition.  Final Clinical Impressions(s) / ED Diagnoses   Final diagnoses:  Sciatica of left side    ED Discharge Orders         Ordered    oxyCODONE-acetaminophen (PERCOCET/ROXICET) 5-325 MG tablet  Every 6 hours PRN     12/20/18 0511    diclofenac Sodium (VOLTAREN) 1 % GEL  4 times daily     12/20/18 0511           Frederica Kuster, PA-C [id number] [phone]    Delora Fuel, MD [id number] [phone]

## 2018-12-20 NOTE — ED Notes (Signed)
Pt was alert and no distress was noted when ambulated to exit.  

## 2018-12-30 ENCOUNTER — Other Ambulatory Visit: Payer: Self-pay | Admitting: Family Medicine

## 2018-12-30 DIAGNOSIS — I10 Essential (primary) hypertension: Secondary | ICD-10-CM

## 2019-01-08 ENCOUNTER — Other Ambulatory Visit: Payer: Self-pay

## 2019-01-08 ENCOUNTER — Encounter: Payer: Self-pay | Admitting: Family Medicine

## 2019-01-08 ENCOUNTER — Ambulatory Visit: Payer: Self-pay | Attending: Family Medicine | Admitting: Family Medicine

## 2019-01-08 DIAGNOSIS — M79605 Pain in left leg: Secondary | ICD-10-CM

## 2019-01-08 DIAGNOSIS — M5416 Radiculopathy, lumbar region: Secondary | ICD-10-CM

## 2019-01-08 MED ORDER — NABUMETONE 500 MG PO TABS: 500.0000 mg | ORAL_TABLET | Freq: Two times a day (BID) | ORAL | 3 refills | Status: DC | PRN

## 2019-01-08 MED ORDER — TIZANIDINE HCL 4 MG PO TABS: 4.0000 mg | ORAL_TABLET | Freq: Every day | ORAL | 0 refills | Status: DC

## 2019-01-08 MED ORDER — GABAPENTIN 300 MG PO CAPS: 300.0000 mg | ORAL_CAPSULE | Freq: Three times a day (TID) | ORAL | 3 refills | Status: DC

## 2019-01-08 NOTE — Progress Notes (Signed)
Virtual Visit via Telephone Note  I connected with Evelyn Crawford on 01/08/19 at 3:06 pm  by telephone and verified that I am speaking with the correct person using two identifiers.   I discussed the limitations, risks, security and privacy concerns of performing an evaluation and management service by telephone and the availability of in person appointments. I also discussed with the patient that there may be a patient responsible charge related to this service. The patient expressed understanding and agreed to proceed.  Patient Location: Home Provider Location: [facility] Others participating in call: [place], [place] contacted patient then transferred the call to me   Attempted to call patient at 2:52 pm and no answer/went to voice mail- C. Aerika Groll, MD  CMA contacted patient and transferred the call to be and call began at 3:06 pm   History of Present Illness:        58 year old female with complaint of continued pain which starts in her left buttock and is sharp and stabbing and radiates down the back of her left leg to her ankle and foot.  The pain that radiates down her leg is sharp and burning.  Patient has been unable to sleep secondary to the pain.  She had been prescribed prednisone taper, tramadol and gabapentin however she felt that these medications did not work and she was seen in the emergency department on 12/19/2018.  She was diagnosed with sciatica and given 2 pills of oxycodone-acetaminophen 5-3 25 in the ED.  She was discharged with prescription for oxycodone-acetaminophen 5-3 25 number 10 pills and prescription for Voltaren gel.  Patient was given follow-up visit with orthopedics by the ED and she states that she went to this visit yesterday.  She was told that she will need an MRI and patient states that she cannot afford the $3000 that she was told that the MRI will cost.  She states that a new medication for treatment of pain was sent to her pharmacy however she has not picked  up the medication yet as she is not sure that she would be able to afford it and she is asked family members to help with the cost.  She does not recall the name of the medication as it was not on her discharge paperwork.  She reports difficulty sleeping due to the pain and she has difficulty performing her job duties secondary to the pain is sometimes she will have a sudden sharp pain that makes her stop doing anything and she feels as if she will cry secondary to the pain.  She reports that her current pain ranges between an 8-10 on a 0-to-10 scale.  She denies any loss of bowel or bladder function.  Pain is worse if she has been sitting still such as when driving and then attempts to stand up and start walking.   Past Medical History:  Diagnosis Date  . Alcohol abuse   . Hypertension   . Iron deficiency anemia   . Thrombocytopenia ([place])   . Tobacco abuse   . Uterine fibroid     Past Surgical History:  Procedure Laterality Date  . caesarean section     x3  . CESAREAN SECTION     3X    Family History  Problem Relation Age of Onset  . Fibroids Mother   . Fibroids Sister     Social History   Tobacco Use  . Smoking status: Current Every Day Smoker    Packs/day: 0.25  Types: Cigarettes  . Smokeless tobacco: Never Used  Substance Use Topics  . Alcohol use: Yes    Comment: daily 24 ounce beer every night  . Drug use: No     No Known Allergies     Observations/Objective: No vital signs or physical exam conducted as visit was done via telephone  Assessment and Plan: 1. Left leg pain; 2.  Lumbar radiculopathy Notes from patient's recent emergency department visit on 12/19/2018 reviewed.  She reports that she saw specialist yesterday however these notes were not in our computer system.  Per pharmacy, she was prescribed Lyrica.  I am not sure patient will be able to afford this medication.  Prescription was sent to pharmacy for patient to try higher dose of gabapentin if she is  unable to afford the Lyrica 300 mg 3 times daily but can start with 1 pill at bedtime to see if this helps with her neuropathic pain.  Prescription also sent to pharmacy for Relafen to help with pain and prescription sent for Zanaflex to take at bedtime to help with muscle spasms as patient reported that Flexeril did not help.  Referral sent to social work to see if help can be found regarding resources or application for [facility] discount that would enable patient to have MRI for further evaluation of her pain. - Ambulatory referral to Social Work - nabumetone (RELAFEN) 500 MG tablet; Take 1 tablet (500 mg total) by mouth 2 (two) times daily as needed for moderate pain. Take after eating  Dispense: 60 tablet; Refill: 3 - gabapentin (NEURONTIN) 300 MG capsule; Take 1 capsule (300 mg total) by mouth 3 (three) times daily. For the first week take one at bedtime then increase to taking in morning and late afternoon  Dispense: 90 capsule; Refill: 3 - tiZANidine (ZANAFLEX) 4 MG tablet; Take 1 tablet (4 mg total) by mouth at bedtime. As needed for muscle spasm  Dispense: 30 tablet; Refill: 0  Follow Up Instructions:Return for 2 to 3 weeks if not better and as needed.    I discussed the assessment and treatment plan with the patient. The patient was provided an opportunity to ask questions and all were answered. The patient agreed with the plan and demonstrated an understanding of the instructions.   The patient was advised to call back or seek an in-person evaluation if the symptoms worsen or if the condition fails to improve as anticipated.  I provided 16 minutes of non-face-to-face time during this encounter.   Antony Blackbird, MD

## 2019-01-08 NOTE — Progress Notes (Signed)
Patient verified DOB Patient has taken medication today. Patient has eaten today.  Patient complains of pain in the left leg beginning in October. Patient request a refill on medications and states gabapentin provides no relief.

## 2019-01-16 ENCOUNTER — Encounter: Payer: Self-pay | Admitting: Family Medicine

## 2019-01-16 ENCOUNTER — Ambulatory Visit: Payer: Self-pay | Attending: Family Medicine | Admitting: Family Medicine

## 2019-01-16 ENCOUNTER — Other Ambulatory Visit: Payer: Self-pay

## 2019-01-16 VITALS — BP 123/79 | HR 100 | Temp 98.5°F | Resp 18 | Ht 67.0 in | Wt 174.0 lb

## 2019-01-16 DIAGNOSIS — Z113 Encounter for screening for infections with a predominantly sexual mode of transmission: Secondary | ICD-10-CM

## 2019-01-16 DIAGNOSIS — E876 Hypokalemia: Secondary | ICD-10-CM

## 2019-01-16 DIAGNOSIS — N898 Other specified noninflammatory disorders of vagina: Secondary | ICD-10-CM

## 2019-01-16 DIAGNOSIS — M79605 Pain in left leg: Secondary | ICD-10-CM

## 2019-01-16 DIAGNOSIS — M5416 Radiculopathy, lumbar region: Secondary | ICD-10-CM

## 2019-01-16 DIAGNOSIS — I10 Essential (primary) hypertension: Secondary | ICD-10-CM

## 2019-01-16 DIAGNOSIS — Z124 Encounter for screening for malignant neoplasm of cervix: Secondary | ICD-10-CM

## 2019-01-16 MED ORDER — HYDROCHLOROTHIAZIDE 25 MG PO TABS: 25.0000 mg | ORAL_TABLET | Freq: Every day | ORAL | 1 refills | Status: DC

## 2019-01-16 NOTE — Progress Notes (Signed)
Established Patient Office Visit  Subjective:  Patient ID: Evelyn Crawford, female    DOB: 09/22/1960  Age: 58 y.o. MRN: [id number]  CC:  Chief Complaint  Patient presents with  . Gynecologic Exam    HPI Evelyn Crawford, 58 year old African-American female, who is status post recent telehealth visit for ED follow-up of low back pain with left leg radiation/sciatica.  She complains of continued low back pain with radiation to the left leg and pain in the left lateral lower leg above the ankle.  She states that the medications prescribed at the last visit have helped but she continues to have pain.        She needs Pap smear today's visit.  She has not had a recent Pap smear and cannot recall when she had her last one done.  She has not had any prior abnormal Pap smears that she is aware of.  She also has some clear to white vaginal discharge, no pelvic or vaginal pain.  No abdominal pain.  She would like testing/screening for sexually transmitted diseases.           She also requests a refill of her hydrochlorothiazide for hypertension.  She denies any headaches or dizziness related to her blood pressure.  She has had issues in the past with low potassium.  She does have some cramping type discomfort in her left lower leg but she believes that this is related to sciatica and not a low potassium at this time.  Past Medical History:  Diagnosis Date  . Alcohol abuse   . Hypertension   . Iron deficiency anemia   . Thrombocytopenia ([place])   . Tobacco abuse   . Uterine fibroid     Past Surgical History:  Procedure Laterality Date  . caesarean section     x3  . CESAREAN SECTION     3X    Family History  Problem Relation Age of Onset  . Fibroids Mother   . Fibroids Sister     Social History   Socioeconomic History  . Marital status: Single    Spouse name: Not on file  . Number of children: Not on file  . Years of education: Not on file  . Highest education level: Not on file   Occupational History  . Not on file  Tobacco Use  . Smoking status: Current Every Day Smoker    Packs/day: 0.25    Types: Cigarettes  . Smokeless tobacco: Never Used  Substance and Sexual Activity  . Alcohol use: Yes    Comment: daily 24 ounce beer every night  . Drug use: No  . Sexual activity: Yes    Birth control/protection: Post-menopausal  Other Topics Concern  . 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  Lives with grown daughter.  Pt is single, sexually active with men only.          Social Determinants of Health   Financial Resource Strain:   . Difficulty of Paying Living Expenses: Not on file  Food Insecurity:   . Worried About Charity fundraiser in the Last Year: Not on file  . Ran Out of Food in the Last Year: Not on file  Transportation Needs:   .  Lack of Transportation (Medical): Not on file  . Lack of Transportation (Non-Medical): Not on file  Physical Activity:   . Days of Exercise per Week: Not on file  . Minutes of Exercise per Session: Not on file  Stress:   . Feeling of Stress : Not on file  Social Connections:   . Frequency of Communication with Friends and Family: Not on file  . Frequency of Social Gatherings with Friends and Family: Not on file  . Attends Religious Services: Not on file  . Active Member of Clubs or Organizations: Not on file  . Attends Archivist Meetings: Not on file  . Marital Status: Not on file  Intimate Partner Violence:   . Fear of Current or Ex-Partner: Not on file  . Emotionally Abused: Not on file  . Physically Abused: Not on file  . Sexually Abused: Not on file    Outpatient Medications Prior to Visit  Medication Sig Dispense Refill  . albuterol (VENTOLIN HFA) 108 (90 Base) MCG/ACT inhaler Inhale 1-2 puffs  into the lungs every 6 (six) hours as needed for wheezing or shortness of breath. 1 Inhaler 3  . aspirin 325 MG tablet Take 325 mg by mouth every 6 (six) hours as needed for moderate pain.    Marland Kitchen diclofenac Sodium (VOLTAREN) 1 % GEL Apply 2 g topically 4 (four) times daily. 50 g 0  . ferrous sulfate 325 (65 FE) MG tablet Take 325 mg by mouth 3 (three) times daily with meals. Reported on 01/23/2015    . gabapentin (NEURONTIN) 300 MG capsule Take 1 capsule (300 mg total) by mouth 3 (three) times daily. For the first week take one at bedtime then increase to taking in morning and late afternoon 90 capsule 3  . hydrochlorothiazide (HYDRODIURIL) 25 MG tablet Take 1 tablet (25 mg total) by mouth daily. 90 tablet 0  . nabumetone (RELAFEN) 500 MG tablet Take 1 tablet (500 mg total) by mouth 2 (two) times daily as needed for moderate pain. Take after eating 60 tablet 3  . tiZANidine (ZANAFLEX) 4 MG tablet Take 1 tablet (4 mg total) by mouth at bedtime. As needed for muscle spasm 30 tablet 0   No facility-administered medications prior to visit.    No Known Allergies  ROS Review of Systems  Constitutional: Positive for fatigue (Mild, poor sleep secondary to back pain).  HENT: Negative for sore throat and trouble swallowing.   Eyes: Negative for photophobia.  Respiratory: Negative for cough and shortness of breath.   Cardiovascular: Negative for chest pain and palpitations.  Gastrointestinal: Negative for abdominal pain, blood in stool, constipation, diarrhea and nausea.  Endocrine: Negative for cold intolerance, heat intolerance, polydipsia, polyphagia and polyuria.  Genitourinary: Positive for vaginal discharge. Negative for dysuria and frequency.  Musculoskeletal: Positive for back pain and gait problem.  Neurological: Negative for dizziness and headaches.  Hematological: Negative for adenopathy. Does not bruise/bleed easily.      Objective:    Physical Exam  Constitutional: She is oriented to  person, place, and time. She appears well-developed and well-nourished.  Neck: No JVD present.  Cardiovascular: Normal rate and regular rhythm.  Pulmonary/Chest: Effort normal and breath sounds normal.  Abdominal: Soft. There is no abdominal tenderness. There is no rebound and no guarding.  Genitourinary:    Uterus normal.     Vaginal discharge present.     Genitourinary Comments: Presence of vaginal discharge within the canal, normal appearance of the cervix, no cervical motion tenderness  and no adnexal tenderness on exam.  CMA present during examination   Musculoskeletal:        General: Tenderness present. No edema.     Cervical back: Neck supple.     Comments: Patient with lumbosacral and left SI joint tenderness to exam as well as lumbar paraspinous spasm.  No CVA tenderness.  Lymphadenopathy:    She has no cervical adenopathy.  Neurological: She is alert and oriented to person, place, and time.  Skin: Skin is warm and dry.  Psychiatric: She has a normal mood and affect. Her behavior is normal.  Nursing note and vitals reviewed.   BP 123/79 (BP Location: Left Arm, Patient Position: Sitting, Cuff Size: Normal)   Pulse 100   Temp 98.5 F (36.9 C) (Oral)   Resp 18   Ht 5\' 7"  (1.702 m)   Wt 174 lb (78.9 kg)   LMP 09/17/2010   SpO2 97%   BMI 27.25 kg/m  Wt Readings from Last 3 Encounters:  01/16/19 174 lb (78.9 kg)  03/15/18 168 lb (76.2 kg)  05/14/17 156 lb (70.8 kg)     Health Maintenance Due  Topic Date Due  . Hepatitis C Screening  Jun 30, 1960  . MAMMOGRAM  07/19/2011  . COLONOSCOPY  07/19/2011  . PAP SMEAR-Modifier  11/01/2013   Pap smear done at today's visit, mammogram order pending.  Patient declines hepatitis C screening as well as referral for colonoscopy at today's visit.  Lab Results  Component Value Date   TSH 1.885 11/02/2010   Lab Results  Component Value Date   WBC 8.5 04/22/2018   HGB 13.8 04/22/2018   HCT 43.2 04/22/2018   MCV 95.6 04/22/2018    PLT 195 04/22/2018   Lab Results  Component Value Date   NA 137 04/22/2018   K 3.3 (L) 04/22/2018   CO2 27 04/22/2018   GLUCOSE 106 (H) 04/22/2018   BUN 10 04/22/2018   CREATININE 0.85 04/22/2018   BILITOT 0.5 01/23/2015   ALKPHOS 89 01/23/2015   AST 30 01/23/2015   ALT 27 01/23/2015   PROT 8.0 01/23/2015   ALBUMIN 4.0 01/23/2015   CALCIUM 9.9 04/22/2018   ANIONGAP 11 04/22/2018   No results found for: CHOL No results found for: HDL No results found for: LDLCALC No results found for: TRIG No results found for: CHOLHDL No results found for: HGBA1C    Assessment & Plan:  1. Papanicolaou smear for cervical cancer screening Pap done at today's visit and she will be notified of the results and if any further interventions or repeat Pap are needed based on the results. - Cytology - PAP  2. Vaginal discharge Patient with presence of vaginal discharge and also requested testing for sexually transmitted infections.  Cervicovaginal ancillary testing done at today's visit for bacterial vaginitis, candidal vaginitis as well as testing for chlamydia, gonorrhea and blood testing for HIV and syphilis.  She will be notified of the results and if further treatment is needed based on these results. - Cervicovaginal ancillary only - HIV antibody (with reflex) - RPR  3. Left leg pain 4. Lumbar radiculopathy Patient with complaint of left leg pain and had difficulty finding comfortable position to have Pap smear done at today's visit secondary to her lower back pain and left leg pain which are likely related to lumbar radiculopathy based on exam and symptoms.  She will be referred to orthopedics for further evaluation and treatment.  Continue use of nonsteroidal anti-inflammatories/acetaminophen and tizanidine as needed. -  AMB referral to orthopedics  5. Essential hypertension Blood pressure controlled at today's visit on current medication.  Continue low-sodium diet and refill provided of  hydrochlorothiazide - hydrochlorothiazide (HYDRODIURIL) 25 MG tablet; Take 1 tablet (25 mg total) by mouth daily.  Dispense: 90 tablet; Refill: 1 - Basic Metabolic Panel  6. Screening for STDs (sexually transmitted diseases) Cervicovaginal ancillary testing done for trichomonas and gonorrhea chlamydia as well as blood testing for HIV and RPR.  She will be notified of the results and if further treatment is needed based on these results. - Cervicovaginal ancillary only - HIV antibody (with reflex) - RPR  7. Hypokalemia She is currently on diuretic blood pressure medication, hydrochlorothiazide and has had past hypokalemia.  She will have BMP at today's visit and follow-up - Basic Metabolic Panel  An After Visit Summary was printed and given to the patient.  Follow-up: Return in about 4 months (around 05/17/2019) for Chronic issues; sooner if any concerns.    Antony Blackbird, MD

## 2019-01-16 NOTE — Patient Instructions (Signed)
Cancer Screening for Women A cancer screening is a test or exam that checks for cancer. Your health care provider will recommend specific cancer screenings based on your age, personal history, and family history of cancer. Work with your health care provider to create a cancer screening schedule that protects your health. Why is cancer screening done? Cancer screening is done to look for cancer in the very early stages, before it spreads and becomes harder to treat and before you would start to notice symptoms. Finding cancer early improves the chances of successful treatment. It may save your life. Who should be screened for cancer? All women should be screened for certain cancers, including breast cancer, cervical cancer, and skin cancer. Your health care provider may recommend screenings for other types of cancer if:  You had cancer before.  You have a family member with cancer.  You have abnormal genes that could increase the risk of cancer.  You have risk factors for certain cancers, such as smoking. When you should be screened for cancer depends on:  Your age.  Your medical history and your family's medical history.  Certain lifestyle factors, such as smoking.  Environmental exposure, such as to asbestos. What are some common cancer screenings? Breast cancer Breast cancer screening is done with a test that takes images of breast tissue (mammogram). Here are some screening guidelines:  When you are age 40-44, you will be given the choice to start having mammograms.  When you are age 45-54, you should have a mammogram every year.  You may start having mammograms before age 45 if you have risk factors for breast cancer, such as having an immediate family member with breast cancer.  When you are age 55 or older, you should have a mammogram every 1-2 years for as long as you are in good health and have a life expectancy of 10 years or more.  It is important to know what your  breasts look and feel like so you can report any changes to your health care provider.  Cervical cancer Cervical cancer screening is done with a Pap test. This testchecks for abnormalities, including the virus that causes cervical cancer (human papillomavirus, or HPV). To perform the test, a health care provider takes a swab of cervical cells during a pelvic exam. Screening for cervical cancer with a Pap test should start at age 21. Here are some screening guidelines:  When you are age 21-29, you should have a Pap test every 3 years.  When you are age 30-65, you should have a Pap test and HPV test every 5 years or have a Pap test every 3 years.  You may be screened for cervical cancer more often if you have risk factors for cervical cancer.  If your Pap tests are abnormal, you may have an HPV test.  If you have had the HPV vaccine, you will still be screened for cervical cancer and follow normal screening recommendations. You do not need to be screened for cervical cancer if any of the following apply to you:  You are older than age 65 and you have not had a serious cervical precancer or cancer in the last 20 years.  Your cervix and uterus have been removed and you have never had cervical cancer or precancerous cells. Endometrial cancer There is no standard screening test for endometrial cancer, but the cancer can be detected with:  A test of a sample of tissue taken from the lining of the uterus (endometrial tissue   biopsy).  A vaginal ultrasound.  Pap tests. If you are at increased risk for endometrial cancer, you may need to have these tests more often than normal. You are at increased risk if:  You have a family history of ovarian, uterine, or colon cancer.  You are taking tamoxifen, a drug that is used to treat breast cancer.  You have certain types of colon cancer. If you have reached menopause, it is especially important to talk with your health care provider about any vaginal  bleeding or spotting. Screening for endometrial cancer is not recommended for women who do not have symptoms of the cancer, such as vaginal bleeding. Colorectal cancer  All adults should have screening for colorectal cancer starting at age 50 and continuing until age 75. Your health care provider may recommend screening at age 45. You will have tests every 1-10 years, depending on your results and the type of screening test. If you have a family history of colon or rectal cancer or other risk factors, you may need to start having screenings earlier. Talk with your health care provider about which screening test is right for you and how often you should be screened. Colorectal cancer screening looks for cancer or for growths called polyps that often form before cancer starts. Tests to look for cancer or polyps include:  Colonoscopy or flexible sigmoidoscopy. For these procedures, a flexible tube with a small camera is inserted into the rectum.  CT colonography. This test uses X-rays and a contrast dye to check the colon for polyps. If a polyp is found, you may need to have a colonoscopy so the polyp can be located and removed. Tests to look for cancer in the stool (feces) include:  Guaiac-based fecal occult blood test (FOBT). This test detects blood in stool. It can be done at home with a kit.  Fecal immunochemical test (FIT). This test detects blood in stool. For this test, you will need to collect stool samples at home.  Stool DNA test. This test looks for blood in stool and any changes in DNA that can lead to colon cancer. For this test, you will need to collect a stool sample at home and send it to a lab.  Skin cancer Skin cancer screening is done by checking the skin for unusual moles or spots and any changes in existing moles. Your health care provider should check your skin for signs of skin cancer at every physical exam. You should check your skin every month and tell your health care  provider right away if anything looks unusual. Women with a higher-than-normal risk for skin cancer may want to see a skin specialist (dermatologist) for an annual body check. Lung cancer Lung cancer screening is done with a CT scan that looks for abnormal cells in the lungs. Discuss lung cancer screening with your health care provider if you are 55-74 years old and if any of the following apply to you:  You currently smoke.  You used to smoke heavily.  You have a smoking history of 1 pack a day for 30 years or 2 packs a day for 15 years.  You have quit smoking within the past 15 years. If you smoke heavily or if you used to smoke, you may need to be screened every year. Where to find more information  National Cancer Institute: https://www.cancer.gov/about-cancer/screening  Centers for Disease Control and Prevention: https://www.cdc.gov/cancer/dcpc/prevention/screening.htm  Department of Health and Human Services: https://www.womenshealth.gov/screening-tests-and-vaccines/screening-tests-for-women Contact a health care provider if:  You have   concerns about any signs or symptoms of cancer, such as: ? Moles that have an unusual shape or color. ? Changes in existing moles. ? A sore on your skin that does not heal. ? Blood in your stool. ? Fatigue that does not go away. ? Frequent pain or cramping in your abdomen. ? Coughing, or coughing up blood. ? Losing weight without trying. ? Lumps or other changes in your breasts. ? Vaginal bleeding, spotting, or changes in your periods. Summary  Be aware of and watch for signs and symptoms of cancer, especially symptoms of breast cancer, cervical cancer, endometrial cancer, colorectal cancer, skin cancer, and lung cancer.  Early detection of cancer with cancer screening may save your life.  Talk with your health care provider about your specific cancer risks.  Work together with your health care provider to create a cancer screening plan  that is right for you. This information is not intended to replace advice given to you by your health care provider. Make sure you discuss any questions you have with your health care provider. Document Released: 10/21/2015 Document Revised: 05/15/2018 Document Reviewed: 10/21/2015 Elsevier Patient Education  [place].  Hypokalemia Hypokalemia means that the amount of potassium in the blood is lower than normal. Potassium is a chemical (electrolyte) that helps regulate the amount of fluid in the body. It also stimulates muscle tightening (contraction) and helps nerves work properly. Normally, most of the body's potassium is inside cells, and only a very small amount is in the blood. Because the amount in the blood is so small, minor changes to potassium levels in the blood can be life-threatening. What are the causes? This condition may be caused by:  Antibiotic medicine.  Diarrhea or vomiting. Taking too much of a medicine that helps you have a bowel movement (laxative) can cause diarrhea and lead to hypokalemia.  Chronic kidney disease (CKD).  Medicines that help the body get rid of excess fluid (diuretics).  Eating disorders, such as bulimia.  Low magnesium levels in the body.  Sweating a lot. What are the signs or symptoms? Symptoms of this condition include:  Weakness.  Constipation.  Fatigue.  Muscle cramps.  Mental confusion.  Skipped heartbeats or irregular heartbeat (palpitations).  Tingling or numbness. How is this diagnosed? This condition is diagnosed with a blood test. How is this treated? This condition may be treated by:  Taking potassium supplements by mouth.  Adjusting the medicines that you take.  Eating more foods that contain a lot of potassium. If your potassium level is very low, you may need to get potassium through an IV and be monitored in the hospital. Follow these instructions at home:   Take over-the-counter and prescription  medicines only as told by your health care provider. This includes vitamins and supplements.  Eat a healthy diet. A healthy diet includes fresh fruits and vegetables, whole grains, healthy fats, and lean proteins.  If instructed, eat more foods that contain a lot of potassium. This includes: ? Nuts, such as peanuts and pistachios. ? Seeds, such as sunflower seeds and pumpkin seeds. ? Peas, lentils, and lima beans. ? Whole grain and bran cereals and breads. ? Fresh fruits and vegetables, such as apricots, avocado, bananas, cantaloupe, kiwi, oranges, tomatoes, asparagus, and potatoes. ? Orange juice. ? Tomato juice. ? Red meats. ? Yogurt.  Keep all follow-up visits as told by your health care provider. This is important. Contact a health care provider if you:  Have weakness that gets worse.  Feel your heart pounding or racing.  Vomit.  Have diarrhea.  Have diabetes (diabetes mellitus) and you have trouble keeping your blood sugar (glucose) in your target range. Get help right away if you:  Have chest pain.  Have shortness of breath.  Have vomiting or diarrhea that lasts for more than 2 days.  Faint. Summary  Hypokalemia means that the amount of potassium in the blood is lower than normal.  This condition is diagnosed with a blood test.  Hypokalemia may be treated by taking potassium supplements, adjusting the medicines that you take, or eating more foods that are high in potassium.  If your potassium level is very low, you may need to get potassium through an IV and be monitored in the hospital. This information is not intended to replace advice given to you by your health care provider. Make sure you discuss any questions you have with your health care provider. Document Released: 01/23/2005 Document Revised: 09/05/2017 Document Reviewed: 09/05/2017 Elsevier Patient Education  2020 [employer].

## 2019-01-17 ENCOUNTER — Other Ambulatory Visit: Payer: Self-pay | Admitting: Family Medicine

## 2019-01-17 DIAGNOSIS — B9689 Other specified bacterial agents as the cause of diseases classified elsewhere: Secondary | ICD-10-CM

## 2019-01-17 DIAGNOSIS — N760 Acute vaginitis: Secondary | ICD-10-CM

## 2019-01-17 LAB — HIV ANTIBODY (ROUTINE TESTING W REFLEX): HIV Screen 4th Generation wRfx: NONREACTIVE

## 2019-01-17 LAB — CERVICOVAGINAL ANCILLARY ONLY
Bacterial Vaginitis (gardnerella): POSITIVE — AB
Candida Glabrata: NEGATIVE
Candida Vaginitis: NEGATIVE
Chlamydia: NEGATIVE
Comment: NEGATIVE
Comment: NEGATIVE
Comment: NEGATIVE
Comment: NEGATIVE
Comment: NEGATIVE
Comment: NORMAL
Neisseria Gonorrhea: NEGATIVE
Trichomonas: NEGATIVE

## 2019-01-17 LAB — BASIC METABOLIC PANEL WITH GFR
BUN/Creatinine Ratio: 17 (ref 9–23)
BUN: 12 mg/dL (ref 6–24)
CO2: 21 mmol/L (ref 20–29)
Calcium: 10.5 mg/dL — ABNORMAL HIGH (ref 8.7–10.2)
Chloride: 99 mmol/L (ref 96–106)
Creatinine, Ser: 0.71 mg/dL (ref 0.57–1.00)
GFR calc Af Amer: 109 mL/min/1.73
GFR calc non Af Amer: 95 mL/min/1.73
Glucose: 101 mg/dL — ABNORMAL HIGH (ref 65–99)
Potassium: 3.4 mmol/L — ABNORMAL LOW (ref 3.5–5.2)
Sodium: 137 mmol/L (ref 134–144)

## 2019-01-17 LAB — SYPHILIS: RPR W/REFLEX TO RPR TITER AND TREPONEMAL ANTIBODIES, TRADITIONAL SCREENING AND DIAGNOSIS ALGORITHM: RPR Ser Ql: NONREACTIVE

## 2019-01-17 MED ORDER — METRONIDAZOLE 500 MG PO TABS: 500.0000 mg | ORAL_TABLET | Freq: Two times a day (BID) | ORAL | 0 refills | Status: AC

## 2019-01-20 LAB — CYTOLOGY - PAP
Comment: NEGATIVE
Diagnosis: NEGATIVE
High risk HPV: NEGATIVE

## 2019-01-22 ENCOUNTER — Ambulatory Visit: Payer: Medicaid Other | Admitting: Family Medicine

## 2019-01-29 ENCOUNTER — Emergency Department (HOSPITAL_COMMUNITY)
Admission: EM | Admit: 2019-01-29 | Discharge: 2019-01-30 | Disposition: A | Discharge status: Home / Self Care | Payer: Medicaid Other | Attending: Emergency Medicine | Admitting: Emergency Medicine

## 2019-01-29 ENCOUNTER — Encounter (HOSPITAL_COMMUNITY): Payer: Self-pay

## 2019-01-29 ENCOUNTER — Other Ambulatory Visit: Payer: Self-pay

## 2019-01-29 DIAGNOSIS — I10 Essential (primary) hypertension: Secondary | ICD-10-CM | POA: Insufficient documentation

## 2019-01-29 DIAGNOSIS — M5442 Lumbago with sciatica, left side: Secondary | ICD-10-CM | POA: Insufficient documentation

## 2019-01-29 DIAGNOSIS — F17210 Nicotine dependence, cigarettes, uncomplicated: Secondary | ICD-10-CM | POA: Insufficient documentation

## 2019-01-29 DIAGNOSIS — Z79899 Other long term (current) drug therapy: Secondary | ICD-10-CM | POA: Insufficient documentation

## 2019-01-29 DIAGNOSIS — R531 Weakness: Secondary | ICD-10-CM | POA: Insufficient documentation

## 2019-01-29 NOTE — ED Triage Notes (Signed)
Pt states that she has been having L Lower leg pain for the past month, been here twice for the same, went to see a specialist and they found nothing, denies injury

## 2019-01-30 MED ORDER — OXYCODONE HCL 5 MG PO TABS
10.0000 mg | ORAL_TABLET | Freq: Once | ORAL | Status: AC
  Administered 2019-01-30: 10 mg via ORAL
  Filled 2019-01-30: qty 2

## 2019-01-30 MED ORDER — OXYCODONE HCL 5 MG PO TABS: 5.0000 mg | ORAL_TABLET | Freq: Four times a day (QID) | ORAL | 0 refills | Status: DC | PRN

## 2019-01-30 NOTE — ED Notes (Signed)
Ambulated pt in room and into hallway with stand by assist. Pt complaining of severe pain again once back in bed. PA aware

## 2019-01-30 NOTE — Discharge Instructions (Addendum)
1. Medications: continue your usual home medications; roxicodone for breakthrough pain 2. Treatment: rest, drink plenty of fluids, gentle stretching as discussed, alternate ice and heat 3. Follow Up: Please followup with your primary doctor in 3 days for discussion of your diagnoses and further evaluation after today's visit;  Return to the ER for worsening back pain, difficulty walking, loss of bowel or bladder control or other concerning symptoms

## 2019-01-30 NOTE — ED Provider Notes (Signed)
[facility] EMERGENCY DEPARTMENT Provider Note   CSN: [id number] Arrival date & time: 01/29/19  2322     History Chief Complaint  Patient presents with  . Leg Pain    Evelyn Crawford is a 58 y.o. female with a hx of alcohol abuse, hypertension, iron urgency anemia presents to the Emergency Department complaining of gradual, persistent, progressively worsening buttock and left leg pain with initial onset October 2020.  Patient reports the pain is sharp and stabbing radiating down the back of her left leg.  She reports the pain in her foot and lower leg is more of a burning sensation.  She reports it prevents her from sleeping.  Pt reports pain has worsened since that time and nothing she has tried has helped.  Patient reports trying prednisone, tramadol, Percocet and gabapentin.  Patient reports walking and sitting makes her pain worse.  Lying flat does make her pain better.  She denies urinary or vaginal symptoms.  No saddle anesthesia, loss of bowel or bladder control or urinary retention. She denies cancer, fevers, weight loss, IVDU.  Patient reports pain today is unchanged from previous, she just feels as if she cannot take it anymore. She reports a feeling of weakness in her leg, but has been able to ambulate without difficulty.  Records reviewed.  Patient evaluated 11/18/2018 for sciatica in the emergency department.  She was seen again on 12/20/2018.  No red flags at either visit.  Symptoms were consistent with sciatica.  Patient followed with her primary care on 01/08/2019.  Previous medications including prednisone had not helped.  Lyrica was prescribed however patient was unable to fill this prescription due to financial constraints.  She was given Relafen, Neurontin and Zanaflex at the December visit.  She reports these have not helped.  Orthopedics recommended an MRI but patient is unable to afford this.   The history is provided by the patient and medical records. No  language interpreter was used.       Past Medical History:  Diagnosis Date  . Alcohol abuse   . Hypertension   . Iron deficiency anemia   . Thrombocytopenia ([place])   . Tobacco abuse   . Uterine fibroid     Patient Active Problem List   Diagnosis Date Noted  . FIBROIDS, UTERUS 11/15/2009  . ANEMIA, IRON DEFICIENCY 11/15/2009  . ALCOHOL ABUSE 11/15/2009  . TOBACCO ABUSE 11/15/2009    Past Surgical History:  Procedure Laterality Date  . caesarean section     x3  . CESAREAN SECTION     3X     OB History    Gravida  3   Para  3   Term  3   Preterm  0   AB  0   Living  3     SAB  0   TAB  0   Ectopic  0   Multiple  0   Live Births              Family History  Problem Relation Age of Onset  . Fibroids Mother   . Fibroids Sister     Social History   Tobacco Use  . Smoking status: Current Every Day Smoker    Packs/day: 0.25    Types: Cigarettes  . Smokeless tobacco: Never Used  Substance Use Topics  . Alcohol use: Yes    Comment: daily 24 ounce beer every night  . Drug use: No    Home Medications Prior to  Admission medications   Medication Sig Start Date End Date Taking? Authorizing Provider  albuterol (VENTOLIN HFA) 108 (90 Base) MCG/ACT inhaler Inhale 1-2 puffs into the lungs every 6 (six) hours as needed for wheezing or shortness of breath. 07/19/18   Fulp, Cammie, MD  aspirin 325 MG tablet Take 325 mg by mouth every 6 (six) hours as needed for moderate pain.    [provider]  diclofenac Sodium (VOLTAREN) 1 % GEL Apply 2 g topically 4 (four) times daily. 12/20/18   Law, Bea Graff, PA-C  ferrous sulfate 325 (65 FE) MG tablet Take 325 mg by mouth 3 (three) times daily with meals. Reported on 01/23/2015    [provider]  gabapentin (NEURONTIN) 300 MG capsule Take 1 capsule (300 mg total) by mouth 3 (three) times daily. For the first week take one at bedtime then increase to taking in morning and late afternoon 01/08/19    Fulp, Cammie, MD  hydrochlorothiazide (HYDRODIURIL) 25 MG tablet Take 1 tablet (25 mg total) by mouth daily. 01/16/19   Fulp, Cammie, MD  nabumetone (RELAFEN) 500 MG tablet Take 1 tablet (500 mg total) by mouth 2 (two) times daily as needed for moderate pain. Take after eating 01/08/19   Fulp, Cammie, MD  oxyCODONE (ROXICODONE) 5 MG immediate release tablet Take 1 tablet (5 mg total) by mouth every 6 (six) hours as needed for severe pain or breakthrough pain. 01/30/19   Althia Egolf, Jarrett Soho, PA-C  tiZANidine (ZANAFLEX) 4 MG tablet Take 1 tablet (4 mg total) by mouth at bedtime. As needed for muscle spasm 01/08/19   Antony Blackbird, MD    Allergies    Patient has no known allergies.  Review of Systems   Review of Systems  Constitutional: Negative for fatigue and fever.  Respiratory: Negative for chest tightness and shortness of breath.   Cardiovascular: Negative for chest pain.  Gastrointestinal: Negative for abdominal pain, diarrhea, nausea and vomiting.  Genitourinary: Negative for dysuria, frequency, hematuria and urgency.  Musculoskeletal: Positive for back pain and gait problem ( 2/2 pain). Negative for joint swelling, neck pain and neck stiffness.  Skin: Negative for rash.  Neurological: Positive for numbness ( Paresthesias of the left foot). Negative for weakness, light-headedness and headaches.  All other systems reviewed and are negative.   Physical Exam Updated Vital Signs BP (!) 112/91 (BP Location: Left Arm)   Pulse 100   Temp 97.9 F (36.6 C) (Oral)   Resp 18   LMP 09/17/2010   SpO2 99%   Physical Exam Vitals and nursing note reviewed.  Constitutional:      General: She is not in acute distress.    Appearance: She is well-developed. She is not diaphoretic.  HENT:     Head: Normocephalic and atraumatic.     Mouth/Throat:     Pharynx: No oropharyngeal exudate.  Eyes:     Conjunctiva/sclera: Conjunctivae normal.  Neck:     Comments: Full ROM without pain  Cardiovascular:     Rate and Rhythm: Normal rate and regular rhythm.     Pulses:          Dorsalis pedis pulses are 2+ on the right side and 2+ on the left side.  Pulmonary:     Effort: Pulmonary effort is normal. No respiratory distress.  Abdominal:     General: There is no distension.     Palpations: Abdomen is soft.     Tenderness: There is no abdominal tenderness.  Musculoskeletal:     Cervical  back: Normal range of motion and neck supple.     Lumbar back: Tenderness present. No swelling, edema or deformity.     Right hip: Normal.     Left hip: Normal.     Comments: No midline tenderness to the  T-spine or L-spine Tenderness to palpation of the left paraspinous muscles of the L-spine Radicular pain is reproduced with palpation of the left paraspinal muscles and left buttock.  Lymphadenopathy:     Cervical: No cervical adenopathy.  Skin:    General: Skin is warm and dry.     Findings: No erythema or rash.  Neurological:     Mental Status: She is alert.     Comments: Speech is clear and goal oriented, follows commands Normal 5/5 strength in upper and lower extremities bilaterally including dorsiflexion and plantar flexion, strong and equal grip strength Sensation normal to light touch; c/o paresthesias to the lateral left foot and ankle Moves extremities without ataxia, coordination intact Antalgic gait Normal balance No Clonus  Psychiatric:        Behavior: Behavior normal.     ED Results / Procedures / Treatments    Procedures Procedures (including critical care time)  Medications Ordered in ED Medications  oxyCODONE (Oxy IR/ROXICODONE) immediate release tablet 10 mg (10 mg Oral Given 01/30/19 0334)    ED Course  I have reviewed the triage vital signs and the nursing notes.  Pertinent labs & imaging results that were available during my care of the patient were reviewed by me and considered in my medical decision making (see chart for details).  Clinical  Course as of Jan 29 534  Thu Jan 30, 2019  0526 Patient reports she is feeling better after pain medicine.  She ambulates with an antalgic gait, but with good balance and without assistance.   [HM]    Clinical Course User Index [HM] Keelan Tripodi, Gwenlyn Perking   MDM Rules/Calculators/A&P                      Patient with back pain consistent with sciatica.  She has been evaluated for this by primary care and orthopedics.  She is pending an MRI.  No neurological deficits on exam. Patient can walk but states is painful.  No loss of bowel or bladder control.  No concern for cauda equina.  No fever, night sweats, weight loss, h/o cancer, IVDU.  RICE protocol and pain medicine indicated and discussed with patient.  Will give short course of narcotic for breakthrough pain given she has tried numerous other things.  I did review the [place] narcotic database prior to prescribing this.  Patient understands importance of close follow-up and reasons to return to the emergency department.  Final Clinical Impression(s) / ED Diagnoses Final diagnoses:  Acute left-sided low back pain with left-sided sciatica    Rx / DC Orders ED Discharge Orders         Ordered    oxyCODONE (ROXICODONE) 5 MG immediate release tablet  Every 6 hours PRN     01/30/19 0529           Arvie Bartholomew, Jarrett Soho, PA-C 01/30/19 0535    Cardama, Grayce Sessions, MD 01/30/19 [phone]

## 2019-01-30 NOTE — ED Notes (Signed)
Gave pt some cheese and crackers with medication

## 2019-01-31 ENCOUNTER — Other Ambulatory Visit: Payer: Self-pay | Admitting: Family Medicine

## 2019-01-31 DIAGNOSIS — M5416 Radiculopathy, lumbar region: Secondary | ICD-10-CM

## 2019-02-03 ENCOUNTER — Telehealth: Payer: Self-pay | Admitting: Family Medicine

## 2019-02-03 DIAGNOSIS — M5416 Radiculopathy, lumbar region: Secondary | ICD-10-CM

## 2019-02-03 NOTE — Telephone Encounter (Signed)
Patient is requesting a call, she just got out of the hospital again for her leg. They requested provider to follow up with an evaluation/MRI. Patient said she is in pain. Patient said medication is making her sick as well as headaches.    Please contact patient as soon as possible.

## 2019-02-05 MED ORDER — TIZANIDINE HCL 4 MG PO TABS: 4.0000 mg | ORAL_TABLET | Freq: Three times a day (TID) | ORAL | 0 refills | Status: DC | PRN

## 2019-02-05 NOTE — Telephone Encounter (Signed)
Please advise on patients concern.

## 2019-02-05 NOTE — Telephone Encounter (Signed)
I have increased her Tizanidine dose and a new prescription sent to the Pharmacy, Please schedule with PCP as soon as possible. Thank you.

## 2019-02-06 NOTE — Telephone Encounter (Signed)
Patient verified DOB Patient is aware of increase being sent to [facility] and to keep appointment with Ortho on 1/11 and to follow up with PCP after that appointment. Patient has a routine appointment scheduled for April with PCP.

## 2019-02-14 ENCOUNTER — Telehealth: Payer: Self-pay | Admitting: Licensed Clinical Social Worker

## 2019-02-14 NOTE — Telephone Encounter (Signed)
Call placed to patient. LCSW introduced self and explained role at [facility] Pt was informed of consult to address behavioral heatlh and/or resource needs.   Pt shared that she has obtained insurance that became active 02/07/19. She has an upcoming scheduled appointment with [facility] to address medical conditions associated with chronic pain.   LCSW validated patient's feelings regarding chronic pain and provided encouragement. Pt was encouraged to schedule an appointment with LCSW should her symptoms of depression and anxiety increase or impair functioning. Pt verbalized understanding and was appreciative for the follow up call.

## 2019-02-17 ENCOUNTER — Ambulatory Visit (INDEPENDENT_AMBULATORY_CARE_PROVIDER_SITE_OTHER): Payer: 59 | Admitting: Family Medicine

## 2019-02-17 ENCOUNTER — Encounter: Payer: Self-pay | Admitting: Family Medicine

## 2019-02-17 ENCOUNTER — Other Ambulatory Visit: Payer: Self-pay

## 2019-02-17 DIAGNOSIS — M5432 Sciatica, left side: Secondary | ICD-10-CM

## 2019-02-17 DIAGNOSIS — M79605 Pain in left leg: Secondary | ICD-10-CM

## 2019-02-17 MED ORDER — PREGABALIN 50 MG PO CAPS: 50.0000 mg | ORAL_CAPSULE | Freq: Two times a day (BID) | ORAL | 3 refills | Status: DC

## 2019-02-17 NOTE — Progress Notes (Signed)
Office Visit Note   Patient: Evelyn Crawford           Date of Birth: [birth year]           MRN: [id number] Visit Date: 02/17/2019 Requested by: Antony Blackbird, MD [place],  [place] [id number] PCP: Antony Blackbird, MD  Subjective: Chief Complaint  Patient presents with  . Left Leg - Pain    Pain in the leg since November, 2020 (before Thanksgiving). NKI. Constant pain in the leg, with burning and popping in the left lateral lower leg. Hurts to stand/walk very much - does stand a lot on her job.    HPI: She is here with left leg pain.  Symptoms started in November, now no definite injury.  She began having left posterior hip pain with radiation down to the calf.  Hip pain is better but the calf pain is constant and now she notices weakness with her leg making her walk with a limp.  Pain is constant, she cannot find a comfortable position.  She has been to the ER and has been to a spine specialist in another practice where they took x-rays which she says were negative per their report.  An MRI scan was apparently ordered but has not been obtained.  She has not been to a physical therapist or chiropractor.  She has been treated with multiple medications including gabapentin, nothing seems to be helping.  Denies bowel or bladder dysfunction.  She never noticed a rash.              ROS: No fevers or chills.  All other systems were reviewed and are negative.  Objective: Vital Signs: LMP 09/17/2010   Physical Exam:  General:  Alert and oriented, in no acute distress. Pulm:  Breathing unlabored. Psy:  Normal mood, congruent affect. Skin: No visible rash. Low back: No tenderness in the midline.  She does have left sciatic notch tenderness.  Straight leg raise is positive on the left, negative on the right.  She has weakness, 4/5, with ankle dorsiflexion and eversion on the left with remainder of lower extremity strength 5/5.  She has 2+ patellar, 2+ right Achilles and 1+ left Achilles  DTR.  Imaging: None today, none available for review.  Assessment & Plan: 1.  Left-sided sciatica with ankle dorsiflexion and eversion weakness, concerning for disc protrusion -Lumbar MRI to evaluate.  Lyrica for pain.  Based on results, could contemplate epidural injection, physical therapy, or surgical consult.     Procedures: No procedures performed  No notes on file     PMFS History: Patient Active Problem List   Diagnosis Date Noted  . FIBROIDS, UTERUS 11/15/2009  . ANEMIA, IRON DEFICIENCY 11/15/2009  . ALCOHOL ABUSE 11/15/2009  . TOBACCO ABUSE 11/15/2009   Past Medical History:  Diagnosis Date  . Alcohol abuse   . Hypertension   . Iron deficiency anemia   . Thrombocytopenia ([place])   . Tobacco abuse   . Uterine fibroid     Family History  Problem Relation Age of Onset  . Fibroids Mother   . Fibroids Sister     Past Surgical History:  Procedure Laterality Date  . caesarean section     x3  . CESAREAN SECTION     3X   Social History   Occupational History  . Not on file  Tobacco Use  . Smoking status: Current Every Day Smoker    Packs/day: 0.25    Types: Cigarettes  .  Smokeless tobacco: Never Used  Substance and Sexual Activity  . Alcohol use: Yes    Comment: daily 24 ounce beer every night  . Drug use: No  . Sexual activity: Yes    Birth control/protection: Post-menopausal

## 2019-02-24 ENCOUNTER — Encounter: Payer: Self-pay | Admitting: Family Medicine

## 2019-03-03 ENCOUNTER — Other Ambulatory Visit: Payer: 59

## 2019-03-10 ENCOUNTER — Ambulatory Visit
Admission: RE | Admit: 2019-03-10 | Discharge: 2019-03-10 | Disposition: A | Discharge status: Home / Self Care | Payer: 59 | Source: Ambulatory Visit | Attending: Family Medicine | Admitting: Family Medicine

## 2019-03-10 ENCOUNTER — Other Ambulatory Visit: Payer: Self-pay

## 2019-03-10 DIAGNOSIS — M5432 Sciatica, left side: Secondary | ICD-10-CM

## 2019-03-10 DIAGNOSIS — M79605 Pain in left leg: Secondary | ICD-10-CM

## 2019-03-11 ENCOUNTER — Telehealth: Payer: Self-pay | Admitting: Family Medicine

## 2019-03-11 ENCOUNTER — Other Ambulatory Visit: Payer: Self-pay | Admitting: Family Medicine

## 2019-03-11 ENCOUNTER — Encounter: Payer: Self-pay | Admitting: Family Medicine

## 2019-03-11 DIAGNOSIS — M48061 Spinal stenosis, lumbar region without neurogenic claudication: Secondary | ICD-10-CM

## 2019-03-11 DIAGNOSIS — M5432 Sciatica, left side: Secondary | ICD-10-CM

## 2019-03-11 DIAGNOSIS — M5416 Radiculopathy, lumbar region: Secondary | ICD-10-CM

## 2019-03-11 NOTE — Telephone Encounter (Signed)
Pt called to let PCP know she completed her MRI 03/10/2019 and her leg is in a lot of pain, she states it is burning. She would like a letter for work be written that states she cant go to work due to her pain from 03/11/2019-03/14/2019. Please follow up and fax when completed  Her employer is  [employer]  [phone]

## 2019-03-11 NOTE — Telephone Encounter (Signed)
MRI shows a mild disc bulge at L3-4 and L4-5, but the disc bulges are not compressing nerves.  However, there is also fat accumulation inside the spinal canal and the fat is putting pressure against the covering around the nerves.  This could result in pressure on the nerves coming out of the spine.    Long-term it is important to maintain a normal, healthy body weight.    For current pain, options include referral for ESI, referral to physical therapy.  Whichever she prefers would be fine.

## 2019-03-11 NOTE — Progress Notes (Signed)
Patient ID: Evelyn Crawford, female   DOB: [birth year], 59 y.o.   MRN: [id number]   Patient left message requesting note to be out of work from 12/1 to 01/11/2020 due to low back and leg pain.  Patient is status post MRI showing moderate spinal stenosis at L4-L5 due to disc and facet degeneration as well as prominent epidural lipomatosis left greater than right.  Epidural fat is compressing the thecal sac, left greater than right.  Patient will be contacted to follow-up with orthopedics regarding treatment options but will also be referred to neurosurgery to see if there are any interventions that can help with her current pain.  Work note will be provided.

## 2019-03-11 NOTE — Progress Notes (Signed)
Patient ID: Evelyn Crawford, female   DOB: 26-Nov-1960, 59 y.o.   MRN: [id number]   Patient called to request work note from 01/07/2020 through 01/11/2020 due to ongoing issues with lumbar radiculopathy, low back pain with radiation.  She is status post MRI on 01/07/2019 which was abnormal showing moderate lumbar spinal stenosis.  Referral will be placed to neurosurgery but she should also follow-up with orthopedics regarding further treatment.

## 2019-03-11 NOTE — Telephone Encounter (Signed)
Patient's note has been printed and is on the printer at the nurses station.  I will sign the letter and return to you to be faxed to patient's employer.  Please contact patient and ask her to follow-up with orthopedics regarding further treatment of her low back pain with radiation but also let patient know that she has been referred to neurosurgery to see if there are any interventions that can be done to help with her current pain

## 2019-03-12 NOTE — Telephone Encounter (Signed)
Able to reach patient. Name and DOB verified. Patient aware that letter was ready. Letter was faxed to number left in message from [place]. [phone] Went over progress note from Dr. Chapman Fitch regarding MRI. Patient verbalized understanding to plan.  Confirmation fax page returned from [facility] confirming fax went through.

## 2019-03-13 NOTE — Addendum Note (Signed)
Addended by: Hortencia Pilar on: 03/13/2019 08:55 AM   Modules accepted: Orders

## 2019-03-13 NOTE — Telephone Encounter (Signed)
Referral orders placed

## 2019-03-13 NOTE — Telephone Encounter (Signed)
I called and advised the patient of her results. She would like to try the [facility].

## 2019-03-20 ENCOUNTER — Other Ambulatory Visit: Payer: Self-pay | Admitting: Family Medicine

## 2019-03-20 ENCOUNTER — Telehealth: Payer: Self-pay | Admitting: Family Medicine

## 2019-03-20 DIAGNOSIS — M5416 Radiculopathy, lumbar region: Secondary | ICD-10-CM

## 2019-03-20 MED ORDER — TRAMADOL HCL 50 MG PO TABS: ORAL_TABLET | ORAL | 0 refills | Status: DC

## 2019-03-20 NOTE — Telephone Encounter (Signed)
Patient calling requesting a return call regarding her leg. Patient says it's urgent

## 2019-03-20 NOTE — Progress Notes (Signed)
Patient ID: Evelyn Crawford, female   DOB: 12/05/1960, 59 y.o.   MRN: [id number]   Patient left a message requesting pain medication for her back pain with radiation until she can follow-up with Neurosurgery. RX for Tramadol 50 mg to take one every 6-8 hours as needed for pain #90 sent to patient's pharmacy

## 2019-03-20 NOTE — Telephone Encounter (Signed)
Patient verified DOB Patient spoke with MA regarding her consistent pain and the results of the MRI. Patient is aware of PT and NEUROSURGERY referrals being placed. Patient has been in contact with PT and declined rehab but was open to injections. Patient is aware of her insurance company being contacted by PT for authorization. Patient is aware of contacting neurosurgery to schedule with their office as well!

## 2019-03-20 NOTE — Telephone Encounter (Signed)
Please patient know that RX was sent to her pharmacy for tramadol to take one every 6-8 hours as needed for pain

## 2019-04-08 ENCOUNTER — Ambulatory Visit (HOSPITAL_COMMUNITY)
Admission: EM | Admit: 2019-04-08 | Discharge: 2019-04-08 | Disposition: A | Discharge status: Home / Self Care | Payer: 59 | Attending: Family Medicine | Admitting: Family Medicine

## 2019-04-08 ENCOUNTER — Other Ambulatory Visit: Payer: Self-pay

## 2019-04-08 ENCOUNTER — Encounter (HOSPITAL_COMMUNITY): Payer: Self-pay

## 2019-04-08 DIAGNOSIS — M48061 Spinal stenosis, lumbar region without neurogenic claudication: Secondary | ICD-10-CM

## 2019-04-08 DIAGNOSIS — M5416 Radiculopathy, lumbar region: Secondary | ICD-10-CM

## 2019-04-08 MED ORDER — METHYLPREDNISOLONE 4 MG PO TBPK: ORAL_TABLET | ORAL | 0 refills | Status: DC

## 2019-04-08 MED FILL — METHYLPREDNISOLONE 4 MG TBP: 4 | 6 days supply | Qty: 21 | Fill #0

## 2019-04-08 NOTE — ED Triage Notes (Signed)
Pt present left leg/foot pain, pt states that her leg/feet is in severe pain with a burning sensation and numbness.

## 2019-04-08 NOTE — Discharge Instructions (Addendum)
Take the medrol pak as directed Take all of day one today Stay off of your feet a couple of days See your primary in follow up

## 2019-04-08 NOTE — ED Provider Notes (Signed)
[place]    CSN: [id number] Arrival date & time: 04/08/19  1421      History   Chief Complaint Chief Complaint  Patient presents with  . Leg Pain  . Foot Swelling    HPI Evelyn Crawford is a 59 y.o. female.   HPI   Patient has known lumbar degenerative disc disease with spinal stenosis.  She had an MRI last month.  It does show that she has disc and epidural fat that impinges the left neuroforamina.  She has chronic left leg pain from sciatica.  She is here for left leg pain, left leg numbness, some left leg swelling.  She is not cognizant of the fact that this is her chronic sciatica flared up.  Seems to have a poor fund of knowledge regarding her disease process, treatment, expectations. MRI report is reviewed Recent medical notes are reviewed Patient was prescribed tramadol but did not pick it up because she states she cannot afford the prescription Patient was prescribed Lyrica but is not taking this because she states she cannot afford the prescription She has a small cyst on the top of her right foot that was drained sometime ago.  There is still some scar tissue that is visible and is raised.  Patient is convinced that this is the source of her pain.   Past Medical History:  Diagnosis Date  . Alcohol abuse   . Hypertension   . Iron deficiency anemia   . Thrombocytopenia ([place])   . Tobacco abuse   . Uterine fibroid     Patient Active Problem List   Diagnosis Date Noted  . FIBROIDS, UTERUS 11/15/2009  . ANEMIA, IRON DEFICIENCY 11/15/2009  . ALCOHOL ABUSE 11/15/2009  . TOBACCO ABUSE 11/15/2009    Past Surgical History:  Procedure Laterality Date  . caesarean section     x3  . CESAREAN SECTION     3X    OB History    Gravida  3   Para  3   Term  3   Preterm  0   AB  0   Living  3     SAB  0   TAB  0   Ectopic  0   Multiple  0   Live Births               Home Medications    Prior to Admission medications     Medication Sig Start Date End Date Taking? Authorizing Provider  albuterol (VENTOLIN HFA) 108 (90 Base) MCG/ACT inhaler Inhale 1-2 puffs into the lungs every 6 (six) hours as needed for wheezing or shortness of breath. 07/19/18   Fulp, Cammie, MD  aspirin 325 MG tablet Take 325 mg by mouth every 6 (six) hours as needed for moderate pain.    [provider]  diclofenac Sodium (VOLTAREN) 1 % GEL Apply 2 g topically 4 (four) times daily. Patient not taking: Reported on 02/17/2019 12/20/18   Frederica Kuster, PA-C  ferrous sulfate 325 (65 FE) MG tablet Take 325 mg by mouth 3 (three) times daily with meals. Reported on 01/23/2015    [provider]  hydrochlorothiazide (HYDRODIURIL) 25 MG tablet Take 1 tablet (25 mg total) by mouth daily. 01/16/19   Fulp, Ander Gaster, MD  methylPREDNISolone (MEDROL DOSEPAK) 4 MG TBPK tablet tad 04/08/19   Raylene Everts, MD  nabumetone (RELAFEN) 500 MG tablet Take 1 tablet (500 mg total) by mouth 2 (two) times daily as needed for moderate  pain. Take after eating 01/08/19   Fulp, Cammie, MD  oxyCODONE (ROXICODONE) 5 MG immediate release tablet Take 1 tablet (5 mg total) by mouth every 6 (six) hours as needed for severe pain or breakthrough pain. Patient not taking: Reported on 02/17/2019 01/30/19   Muthersbaugh, Jarrett Soho, PA-C  pregabalin (LYRICA) 50 MG capsule Take 1 capsule (50 mg total) by mouth 2 (two) times daily. 02/17/19   Hilts, Legrand Como, MD  tiZANidine (ZANAFLEX) 4 MG tablet Take 1 tablet (4 mg total) by mouth every 8 (eight) hours as needed for muscle spasms. As needed for muscle spasm 02/05/19   Charlott Rakes, MD  traMADol Veatrice Bourbon) 50 MG tablet One every 6-8 hours as needed for pain 03/20/19   Antony Blackbird, MD    Family History Family History  Problem Relation Age of Onset  . Fibroids Mother   . Fibroids Sister     Social History Social History   Tobacco Use  . Smoking status: Current Every Day Smoker    Packs/day: 0.25    Types:  Cigarettes  . Smokeless tobacco: Never Used  Substance Use Topics  . Alcohol use: Yes    Comment: daily 24 ounce beer every night  . Drug use: No     Allergies   Patient has no known allergies.   Review of Systems Review of Systems  Musculoskeletal: Positive for back pain.  Neurological: Positive for weakness and numbness.       Left leg     Physical Exam Triage Vital Signs ED Triage Vitals  Enc Vitals Group     BP 04/08/19 1451 (!) 148/97     Pulse Rate 04/08/19 1451 78     Resp 04/08/19 1451 16     Temp 04/08/19 1451 97.6 F (36.4 C)     Temp Source 04/08/19 1451 Oral     SpO2 04/08/19 1451 99 %     Weight --      Height --      Head Circumference --      Peak Flow --      Pain Score 04/08/19 1450 10     Pain Loc --      Pain Edu? --      Excl. in [place]? --    No data found.  Updated Vital Signs BP (!) 148/97 (BP Location: Right Arm)   Pulse 78   Temp 97.6 F (36.4 C) (Oral)   Resp 16   LMP 09/17/2010   SpO2 99%      Physical Exam Constitutional:      General: She is not in acute distress.    Appearance: She is well-developed and normal weight.     Comments: In obvious discomfort.  HENT:     Head: Normocephalic and atraumatic.     Mouth/Throat:     Comments: Mask is in place Eyes:     Conjunctiva/sclera: Conjunctivae normal.     Pupils: Pupils are equal, round, and reactive to light.  Cardiovascular:     Rate and Rhythm: Normal rate.  Pulmonary:     Effort: Pulmonary effort is normal. No respiratory distress.  Musculoskeletal:        General: Normal range of motion.     Cervical back: Normal range of motion.  Skin:    General: Skin is warm and dry.  Neurological:     Mental Status: She is alert.     Sensory: Sensory deficit present.     Motor: Weakness present.  Gait: Gait abnormal.     Comments: Patient does have hyperesthesias, increased pain with even light touch of the lateral left leg.  No edema is noted.  Strength diminished to  ankle flexion.  Psychiatric:        Mood and Affect: Mood normal.        Behavior: Behavior normal.      UC Treatments / Results  Labs (all labs ordered are listed, but only abnormal results are displayed) Labs Reviewed - No data to display  EKG   Radiology No results found.  Procedures Procedures (including critical care time)  Medications Ordered in UC Medications - No data to display  Initial Impression / Assessment and Plan / UC Course  I have reviewed the triage vital signs and the nursing notes.  Pertinent labs & imaging results that were available during my care of the patient were reviewed by me and considered in my medical decision making (see chart for details).     I told patient she has lumbar radiculopathy.  I told her that her symptoms were a form of sciatica.  I am giving her a Medrol Dosepak.  I have encouraged her to follow-up with her primary care doctor.  When I ask about seeing a neurosurgeon the patient seems confused and says she has seen one.  I think which she said was a neurologist because she states they "stuck needles in me". Final Clinical Impressions(s) / UC Diagnoses   Final diagnoses:  Left lumbar radiculopathy  Spinal stenosis of lumbar region, unspecified whether neurogenic claudication present     Discharge Instructions     Take the medrol pak as directed Take all of day one today Stay off of your feet a couple of days See your primary in follow up     ED Prescriptions    Medication Sig Dispense Auth. Provider   methylPREDNISolone (MEDROL DOSEPAK) 4 MG TBPK tablet tad 21 tablet Raylene Everts, MD     PDMP not reviewed this encounter.   Raylene Everts, MD 04/08/19 2112

## 2019-04-11 ENCOUNTER — Telehealth: Payer: Self-pay | Admitting: Family Medicine

## 2019-04-11 ENCOUNTER — Other Ambulatory Visit: Payer: Self-pay | Admitting: Family Medicine

## 2019-04-11 DIAGNOSIS — I10 Essential (primary) hypertension: Secondary | ICD-10-CM

## 2019-04-11 DIAGNOSIS — M5416 Radiculopathy, lumbar region: Secondary | ICD-10-CM

## 2019-04-11 MED ORDER — HYDROCHLOROTHIAZIDE 25 MG PO TABS: 25.0000 mg | ORAL_TABLET | Freq: Every day | ORAL | 1 refills | Status: DC

## 2019-04-11 NOTE — Telephone Encounter (Signed)
Please review emergency department note.  Patient already has 2 prescriptions on file at her pharmacy for medication for treatment of pain.  She has prescriptions for both tramadol and Lyrica but reported to the emergency department that she cannot afford to pick up these medications.  She can also try over-the-counter medications such as Aleve but taking 2 pills at a time or ibuprofen by taking 4 of the 200 mg over-the-counter.  She should make sure that she eats prior to taking the medication.  I will also place a social work consult to see if there are any resources available to help patient with the cost of her medications

## 2019-04-11 NOTE — Telephone Encounter (Signed)
Patient called and requested to speak with pcp regarding her pain. Patient stated that she had to go to the urgent care. Patient requested for something for burning, tingling, pain in her L foot. Please follow up at your earliest convenience. Patient stated that it is making it difficult for her to walk.

## 2019-04-11 NOTE — Progress Notes (Signed)
Patient ID: Evelyn Crawford, female   DOB: [birth year], 59 y.o.   MRN: [id number]   Message received from patient that she needs refill of her blood pressure medicines as she will run out on Monday.  Refill for hydrochlorothiazide sent to patient's pharmacy

## 2019-04-11 NOTE — Progress Notes (Signed)
Patient ID: Evelyn Crawford, female   DOB: [birth year], 59 y.o.   MRN: [id number]   Please see medical records regarding emergency department visit on 04/08/2019.  Patient has medications on file at her pharmacy for both tramadol and Lyrica for treatment of her lumbar radiculopathy however reported to the emergency department that she cannot afford the medications.  Patient is being contacted by RN regarding medications on file as well as ways that she can take over-the-counter medication to help with pain.  Social work consult is being placed to see if there are resources to help the patient obtain her medications from the pharmacy

## 2019-04-11 NOTE — Telephone Encounter (Signed)
Called pt / DOB and Name verified/ Made pt aware of Md message/ Verbalized understanding

## 2019-04-11 NOTE — Telephone Encounter (Signed)
Pt requested refill on her BP medication/ Stated she only has  meds till Monday.  Thank you

## 2019-04-11 NOTE — Progress Notes (Signed)
Patient ID: Evelyn Crawford, female   DOB: 1960-05-10, 59 y.o.   MRN: [id number]   Patient's emergency department note was reviewed.  Patient was placed on prednisone taper for left lumbar radiculopathy.  Please see ED note.  Patient left phone message requesting medication to help with her foot pain/numbness.  Patient has per ED note prescriptions for both tramadol and Lyrica which she has not picked up secondary to cost.  Will make referral to social work to contact patient to see if there are resources to help her obtain her medications that are currently on file for treatment of pain and patient may make office visit or follow-up with orthopedics if her pain continues

## 2019-04-14 ENCOUNTER — Telehealth: Payer: Self-pay | Admitting: Licensed Clinical Social Worker

## 2019-04-14 NOTE — Telephone Encounter (Signed)
Call placed to patient, per PCP request. LCSW discussed various options to assist with costs of prescription medications. Pt was appreciative for the information. No additional concerns noted.

## 2019-05-07 ENCOUNTER — Telehealth: Payer: Self-pay | Admitting: Licensed Clinical Social Worker

## 2019-05-07 NOTE — Progress Notes (Signed)
Patient ID: Evelyn Crawford, female   DOB: [birth year], 59 y.o.   MRN: [id number] Virtual Visit via Telephone Note  I connected with Evelyn Crawford on 05/08/19 at 10:10 AM EDT by telephone and verified that I am speaking with the correct person using two identifiers.   I discussed the limitations, risks, security and privacy concerns of performing an evaluation and management service by telephone and the availability of in person appointments. I also discussed with the patient that there may be a patient responsible charge related to this service. The patient expressed understanding and agreed to proceed.  PATIENT visit by telephone virtually in the context of Covid-19 pandemic. Patient location: My Location:  [place] office Persons on the call:    History of Present Illness: After ED visit 04/28/2019 for lumbar radiculopathy.  She feels like she is having a problem with her L foot in addition to low back and  Leg paIN ANd weakness.  She is not taking lyrica.  Prednisone that she was prescribed in the ED did not help.  Aleve only helps a little.  See MRI 03/2019.    From ED A/P:  told patient she has lumbar radiculopathy.  I told her that her symptoms were a form of sciatica.  I am giving her a Medrol Dosepak.  I have encouraged her to follow-up with her primary care doctor.  When I ask about seeing a neurosurgeon the patient seems confused and says she has seen one.  I think which she said was a neurologist because she states they "stuck needles in me".    Observations/Objective: NAD.  A&Ox3    Assessment and Plan: 1. Lumbar radiculopathy Not improving after prednisone;  Abnormal MRI 03/2019 - Ambulatory referral to Neurosurgery - diclofenac (VOLTAREN) 75 MG EC tablet; Take 1 tablet (75 mg total) by mouth 2 (two) times daily.  Dispense: 60 tablet; Refill: 0 - pregabalin (LYRICA) 50 MG capsule; Take 1 capsule (50 mg total) by mouth 2 (two) times daily.  Dispense: 60 capsule; Refill: 3 -  cyclobenzaprine (FLEXERIL) 5 MG tablet; Take 1 tablet (5 mg total) by mouth 3 (three) times daily as needed for muscle spasms.  Dispense: 30 tablet; Refill: 1  2. Left foot pain - Ambulatory referral to Podiatry - diclofenac (VOLTAREN) 75 MG EC tablet; Take 1 tablet (75 mg total) by mouth 2 (two) times daily.  Dispense: 60 tablet; Refill: 0 - pregabalin (LYRICA) 50 MG capsule; Take 1 capsule (50 mg total) by mouth 2 (two) times daily.  Dispense: 60 capsule; Refill: 3 - cyclobenzaprine (FLEXERIL) 5 MG tablet; Take 1 tablet (5 mg total) by mouth 3 (three) times daily as needed for muscle spasms.  Dispense: 30 tablet; Refill: 1  3. Encounter for examination following treatment at hospital Not better   Follow Up Instructions: See PCP 2-3 months   I discussed the assessment and treatment plan with the patient. The patient was provided an opportunity to ask questions and all were answered. The patient agreed with the plan and demonstrated an understanding of the instructions.   The patient was advised to call back or seek an in-person evaluation if the symptoms worsen or if the condition fails to improve as anticipated.  I provided 13 minutes of non-face-to-face time during this encounter.   Freeman Caldron, PA-C

## 2019-05-07 NOTE — Telephone Encounter (Signed)
MSW intern placed call to patient to follow up regarding social work referral placed by PCP Fulp regarding cost of medications. Per chart review, Evelyn See, LCSW spoke with patient 04/14/19 regarding social work referral. MSW intern provided emotional support regarding chronic pain. No additional concerns noted.

## 2019-05-08 ENCOUNTER — Ambulatory Visit: Payer: Medicaid Other | Admitting: Family Medicine

## 2019-05-08 ENCOUNTER — Ambulatory Visit: Payer: 59 | Attending: Family Medicine | Admitting: Physician Assistant

## 2019-05-08 ENCOUNTER — Other Ambulatory Visit: Payer: Self-pay

## 2019-05-08 DIAGNOSIS — M5416 Radiculopathy, lumbar region: Secondary | ICD-10-CM | POA: Diagnosis not present

## 2019-05-08 DIAGNOSIS — M79672 Pain in left foot: Secondary | ICD-10-CM | POA: Diagnosis not present

## 2019-05-08 DIAGNOSIS — Z09 Encounter for follow-up examination after completed treatment for conditions other than malignant neoplasm: Secondary | ICD-10-CM

## 2019-05-08 MED ORDER — DICLOFENAC SODIUM 75 MG PO TBEC: 75.0000 mg | DELAYED_RELEASE_TABLET | Freq: Two times a day (BID) | ORAL | 0 refills | Status: DC

## 2019-05-08 MED ORDER — PREGABALIN 50 MG PO CAPS: 50.0000 mg | ORAL_CAPSULE | Freq: Two times a day (BID) | ORAL | 3 refills | Status: DC

## 2019-05-08 MED ORDER — CYCLOBENZAPRINE HCL 5 MG PO TABS: 5.0000 mg | ORAL_TABLET | Freq: Three times a day (TID) | ORAL | 1 refills | Status: DC | PRN

## 2019-05-15 ENCOUNTER — Ambulatory Visit: Payer: 59

## 2019-05-16 ENCOUNTER — Telehealth: Payer: Self-pay

## 2019-05-16 NOTE — Telephone Encounter (Signed)
LYRICA/PREGABALIN PRIOR AUTH DENIED ON [employer]-DIAGNOSIS OF LUMBAR RADICULOPATHY NOT APPROVED MEDICAL CONDITION FOR COVERAGE 05/12/2019

## 2019-05-28 ENCOUNTER — Encounter: Payer: Self-pay | Admitting: Podiatry

## 2019-05-28 ENCOUNTER — Other Ambulatory Visit: Payer: Self-pay

## 2019-05-28 ENCOUNTER — Other Ambulatory Visit: Payer: Self-pay | Admitting: Podiatry

## 2019-05-28 ENCOUNTER — Ambulatory Visit: Payer: 59 | Admitting: Podiatry

## 2019-05-28 ENCOUNTER — Telehealth: Payer: Self-pay | Admitting: *Deleted

## 2019-05-28 ENCOUNTER — Ambulatory Visit (INDEPENDENT_AMBULATORY_CARE_PROVIDER_SITE_OTHER): Payer: 59

## 2019-05-28 DIAGNOSIS — M79672 Pain in left foot: Secondary | ICD-10-CM

## 2019-05-28 DIAGNOSIS — M778 Other enthesopathies, not elsewhere classified: Secondary | ICD-10-CM

## 2019-05-28 NOTE — Progress Notes (Signed)
Subjective:   Patient ID: Evelyn Crawford, female   DOB: 59 y.o.   MRN: [id number]   HPI Patient presents stating she is at irritation on top of her left foot that is been there since she was a child and its sore and she feels like the tendon may be inflamed.  States is been going on for a while she had it worked on which helped temporarily but it reoccurred to a significant range.  Patient smokes quarter pack per day and is not active   Review of Systems  All other systems reviewed and are negative.       Objective:  Physical Exam Vitals and nursing note reviewed.  Constitutional:      Appearance: She is well-developed.  Pulmonary:     Effort: Pulmonary effort is normal.  Musculoskeletal:        General: Normal range of motion.  Skin:    General: Skin is warm.  Neurological:     Mental Status: She is alert.     Neurovascular status intact muscle strength was found to be slightly diminished with range of motion adequate.  There is a nodule in the dorsum of the left foot with inflammation of the extensor tendon and it is localized to this area with no other pathology noted with patient also complaining of nerve pain and is seeing a physician for nerve pain in her leg     Assessment:  Possibility for extensor tendinitis left with lesion which appears to be benign and just irritative     Plan:  H&P x-ray reviewed sterile prep done carefully injected the extensor complex left 3 mg Dexasone Kenalog and I went ahead and I debrided the lesion we will see when it reoccurs and it may ultimately require wide excision if it remains aggravating for her  X-rays indicate that there is no signs of spur or bony injury associated with this

## 2019-06-09 ENCOUNTER — Other Ambulatory Visit: Payer: Self-pay | Admitting: Physical Medicine and Rehabilitation

## 2019-06-09 DIAGNOSIS — F411 Generalized anxiety disorder: Secondary | ICD-10-CM

## 2019-06-09 MED ORDER — DIAZEPAM 5 MG PO TABS: ORAL_TABLET | ORAL | 0 refills | Status: DC

## 2019-06-09 NOTE — Progress Notes (Signed)
Pre-procedure diazepam ordered for pre-operative anxiety.  

## 2019-06-09 NOTE — Telephone Encounter (Signed)
Done

## 2019-06-10 NOTE — Telephone Encounter (Signed)
Called pt and advised.  

## 2019-06-17 ENCOUNTER — Encounter: Payer: 59 | Admitting: Physical Medicine and Rehabilitation

## 2019-06-20 ENCOUNTER — Telehealth: Payer: Self-pay | Admitting: Family Medicine

## 2019-06-25 ENCOUNTER — Telehealth: Payer: Self-pay | Admitting: Family Medicine

## 2019-06-25 NOTE — Telephone Encounter (Signed)
Patient called in and requested for a note for her to return to work 06-26-19 due to her having a cough. Please follow up at your earliest convenience.

## 2019-06-25 NOTE — Telephone Encounter (Signed)
Please find out more from the patient regarding her symptoms and if she was seen anywhere regarding her symptoms. Did she receive COVID testing? She may need an appointment- telephone okay in follow-up in order to receive a work absence note

## 2019-06-26 NOTE — Telephone Encounter (Signed)
Spoke with patient and informed her with what provider stated and per pt she is fine. She went back to work

## 2019-08-08 ENCOUNTER — Telehealth: Payer: Self-pay

## 2019-08-08 NOTE — Telephone Encounter (Signed)
Patient called in to sch an appt to be seen for her left foot cyst. Says it has appeared again on her foot.

## 2019-08-12 NOTE — Telephone Encounter (Signed)
Called pt and lvm #1 

## 2019-09-22 ENCOUNTER — Emergency Department (HOSPITAL_COMMUNITY)
Admission: EM | Admit: 2019-09-22 | Discharge: 2019-09-22 | Disposition: A | Discharge status: Home / Self Care | Payer: 59 | Attending: Emergency Medicine | Admitting: Emergency Medicine

## 2019-09-22 ENCOUNTER — Encounter (HOSPITAL_COMMUNITY): Payer: Self-pay | Admitting: Emergency Medicine

## 2019-09-22 DIAGNOSIS — R202 Paresthesia of skin: Secondary | ICD-10-CM | POA: Insufficient documentation

## 2019-09-22 DIAGNOSIS — Z5321 Procedure and treatment not carried out due to patient leaving prior to being seen by health care provider: Secondary | ICD-10-CM | POA: Diagnosis not present

## 2019-09-22 DIAGNOSIS — M79605 Pain in left leg: Secondary | ICD-10-CM | POA: Diagnosis not present

## 2019-09-22 NOTE — ED Triage Notes (Addendum)
Pt c/o L leg pain, swelling and tingling from thigh to foot onset last November. Pt has had MRI and medications pt has not changed. Pain 10/10, nothing for pain relief today.  Pt ambulatory with steady gait.

## 2019-09-22 NOTE — ED Notes (Addendum)
Pt turned labels in and left. Unable to get vitals. Pt will be moved OTF.

## 2019-11-04 ENCOUNTER — Ambulatory Visit: Payer: 59 | Attending: Family Medicine | Admitting: Family Medicine

## 2019-11-04 ENCOUNTER — Encounter: Payer: Self-pay | Admitting: Family Medicine

## 2019-11-04 ENCOUNTER — Other Ambulatory Visit: Payer: Self-pay

## 2019-11-04 DIAGNOSIS — M79672 Pain in left foot: Secondary | ICD-10-CM | POA: Diagnosis not present

## 2019-11-04 DIAGNOSIS — M5416 Radiculopathy, lumbar region: Secondary | ICD-10-CM | POA: Diagnosis not present

## 2019-11-04 MED ORDER — CYCLOBENZAPRINE HCL 10 MG PO TABS: 10.0000 mg | ORAL_TABLET | Freq: Two times a day (BID) | ORAL | 1 refills | Status: DC | PRN

## 2019-11-04 MED ORDER — PREDNISONE 20 MG PO TABS: 20.0000 mg | ORAL_TABLET | Freq: Every day | ORAL | 0 refills | Status: DC

## 2019-11-04 MED ORDER — PREGABALIN 50 MG PO CAPS: 50.0000 mg | ORAL_CAPSULE | Freq: Two times a day (BID) | ORAL | 1 refills | Status: DC

## 2019-11-04 NOTE — Progress Notes (Signed)
Having pain in left leg.

## 2019-11-04 NOTE — Patient Instructions (Signed)
Radicular Pain Radicular pain is a type of pain that spreads from your back or neck along a spinal nerve. Spinal nerves are nerves that leave the spinal cord and go to the muscles. Radicular pain is sometimes called radiculopathy, radiculitis, or a pinched nerve. When you have this type of pain, you may also have weakness, numbness, or tingling in the area of your body that is supplied by the nerve. The pain may feel sharp and burning. Depending on which spinal nerve is affected, the pain may occur in the:  Neck area (cervical radicular pain). You may also feel pain, numbness, weakness, or tingling in the arms.  Mid-spine area (thoracic radicular pain). You would feel this pain in the back and chest. This type is rare.  Lower back area (lumbar radicular pain). You would feel this pain as low back pain. You may feel pain, numbness, weakness, or tingling in the buttocks or legs. Sciatica is a type of lumbar radicular pain that shoots down the back of the leg. Radicular pain occurs when one of the spinal nerves becomes irritated or squeezed (compressed). It is often caused by something pushing on a spinal nerve, such as one of the bones of the spine (vertebrae) or one of the round cushions between vertebrae (intervertebral disks). This can result from:  An injury.  Wear and tear or aging of a disk.  The growth of a bone spur that pushes on the nerve. Radicular pain often goes away when you follow instructions from your health care provider for relieving pain at home. Follow these instructions at home: Managing pain      If directed, put ice on the affected area: ? Put ice in a plastic bag. ? Place a towel between your skin and the bag. ? Leave the ice on for 20 minutes, 2-3 times a day.  If directed, apply heat to the affected area as often as told by your health care provider. Use the heat source that your health care provider recommends, such as a moist heat pack or a heating pad. ? Place  a towel between your skin and the heat source. ? Leave the heat on for 20-30 minutes. ? Remove the heat if your skin turns bright red. This is especially important if you are unable to feel pain, heat, or cold. You may have a greater risk of getting burned. Activity   Do not sit or rest in bed for long periods of time.  Try to stay as active as possible. Ask your health care provider what type of exercise or activity is best for you.  Avoid activities that make your pain worse, such as bending and lifting.  Do not lift anything that is heavier than 10 lb (4.5 kg), or the limit that you are told, until your health care provider says that it is safe.  Practice using proper technique when lifting items. Proper lifting technique involves bending your knees and rising up.  Do strength and range-of-motion exercises only as told by your health care provider or physical therapist. General instructions  Take over-the-counter and prescription medicines only as told by your health care provider.  Pay attention to any changes in your symptoms.  Keep all follow-up visits as told by your health care provider. This is important. ? Your health care provider may send you to a physical therapist to help with this pain. Contact a health care provider if:  Your pain and other symptoms get worse.  Your pain medicine is not   helping.  Your pain has not improved after a few weeks of home care.  You have a fever. Get help right away if:  You have severe pain, weakness, or numbness.  You have difficulty with bladder or bowel control. Summary  Radicular pain is a type of pain that spreads from your back or neck along a spinal nerve.  When you have radicular pain, you may also have weakness, numbness, or tingling in the area of your body that is supplied by the nerve.  The pain may feel sharp or burning.  Radicular pain may be treated with ice, heat, medicines, or physical therapy. This  information is not intended to replace advice given to you by your health care provider. Make sure you discuss any questions you have with your health care provider. Document Revised: 08/07/2017 Document Reviewed: 08/07/2017 Elsevier Patient Education  2020 [employer]  

## 2019-11-04 NOTE — Progress Notes (Signed)
Subjective:  Patient ID: Evelyn Crawford, female    DOB: [birth year]  Age: 59 y.o. MRN: [id number]  CC: Leg Pain   HPI IDONA STACH is a 59 year old female with a history of lumbar spinal stenosis and lumbar radiculopathy  Seen in 05/2019 by [facility] and as per notes she had informed them she would return to her orthopedic in [facility] for the epidural spinal injection. The patient informs me she never followed through with this and notes in epic reveal she was supposed to see Dr. Ernestina Patches for this. Complains her leg has been hurting for the last 10 months and pain radiates to her L foot. Medications have been ineffective and symptoms are worse with working. She would like a note to be out tomorrow so she can rest.  MRI lumbar spine from 03/2019 revealed: IMPRESSION: Moderate spinal stenosis L4-5 due to disc and facet degeneration as well as prominent epidural lipomatosis, left greater than right. Epidural fat is compressing the thecal sac, left greater than right  Disc and facet degeneration L5-S1 as well as epidural lipomatosis, left greater than right compressing the distal thecal sac.    Past Medical History:  Diagnosis Date  . Alcohol abuse   . Hypertension   . Iron deficiency anemia   . Thrombocytopenia ([place])   . Tobacco abuse   . Uterine fibroid     Past Surgical History:  Procedure Laterality Date  . caesarean section     x3  . CESAREAN SECTION     3X    Family History  Problem Relation Age of Onset  . Fibroids Mother   . Fibroids Sister     No Known Allergies  Outpatient Medications Prior to Visit  Medication Sig Dispense Refill  . hydrochlorothiazide (HYDRODIURIL) 25 MG tablet Take 1 tablet (25 mg total) by mouth daily. 90 tablet 1  . albuterol (VENTOLIN HFA) 108 (90 Base) MCG/ACT inhaler Inhale 1-2 puffs into the lungs every 6 (six) hours as needed for wheezing or shortness of breath. 1 Inhaler 3  . aspirin 325 MG tablet Take 325 mg by mouth  every 6 (six) hours as needed for moderate pain. (Patient not taking: Reported on 11/04/2019)    . diazepam (VALIUM) 5 MG tablet Take 1 by mouth 1 hour  pre-procedure with very light food. May bring 2nd tablet to appointment. (Patient not taking: Reported on 11/04/2019) 2 tablet 0  . diclofenac (VOLTAREN) 75 MG EC tablet Take 1 tablet (75 mg total) by mouth 2 (two) times daily. (Patient not taking: Reported on 11/04/2019) 60 tablet 0  . diclofenac Sodium (VOLTAREN) 1 % GEL Apply 2 g topically 4 (four) times daily. (Patient not taking: Reported on 11/04/2019) 50 g 0  . ferrous sulfate 325 (65 FE) MG tablet Take 325 mg by mouth 3 (three) times daily with meals. Reported on 01/23/2015 (Patient not taking: Reported on 11/04/2019)    . nabumetone (RELAFEN) 500 MG tablet Take 1 tablet (500 mg total) by mouth 2 (two) times daily as needed for moderate pain. Take after eating (Patient not taking: Reported on 11/04/2019) 60 tablet 3  . oxyCODONE (ROXICODONE) 5 MG immediate release tablet Take 1 tablet (5 mg total) by mouth every 6 (six) hours as needed for severe pain or breakthrough pain. (Patient not taking: Reported on 11/04/2019) 15 tablet 0  . traMADol (ULTRAM) 50 MG tablet One every 6-8 hours as needed for pain (Patient not taking: Reported on 11/04/2019) 90 tablet 0  . cyclobenzaprine (  FLEXERIL) 5 MG tablet Take 1 tablet (5 mg total) by mouth 3 (three) times daily as needed for muscle spasms. (Patient not taking: Reported on 11/04/2019) 30 tablet 1  . pregabalin (LYRICA) 50 MG capsule Take 1 capsule (50 mg total) by mouth 2 (two) times daily. (Patient not taking: Reported on 11/04/2019) 60 capsule 3   No facility-administered medications prior to visit.     ROS Review of Systems  Objective:  BP 123/78   Pulse 76   Ht 5\' 6"  (1.676 m)   Wt 169 lb 9.6 oz (76.9 kg)   LMP 09/17/2010   SpO2 99%   BMI 27.37 kg/m   BP/Weight 11/04/2019 0/94/7096 03/16/3660  Systolic BP 947 654 650  Diastolic BP 78 354 97    Wt. (Lbs) 169.6 167 -  BMI 27.37 26.95 -      Physical Exam Constitutional:      Appearance: She is well-developed.  Neck:     Vascular: No JVD.  Cardiovascular:     Rate and Rhythm: Normal rate.     Heart sounds: Normal heart sounds. No murmur heard.   Pulmonary:     Effort: Pulmonary effort is normal.     Breath sounds: Normal breath sounds. No wheezing or rales.  Chest:     Chest wall: No tenderness.  Abdominal:     General: Bowel sounds are normal. There is no distension.     Palpations: Abdomen is soft. There is no mass.     Tenderness: There is no abdominal tenderness.  Musculoskeletal:        General: Normal range of motion.     Right lower leg: No edema.     Left lower leg: No edema.  Neurological:     Mental Status: She is alert and oriented to person, place, and time.     Sensory: Sensory deficit (hyperesthesia of left leg) present.     Motor: Weakness (weak L dorsiflexion and plantar flexion; R is normal) present.     Deep Tendon Reflexes:     Reflex Scores:      Patellar reflexes are 2+ on the right side and 3+ on the left side. Psychiatric:        Mood and Affect: Mood normal.     CMP Latest Ref Rng & Units 01/16/2019 04/22/2018 03/15/2018  Glucose 65 - 99 mg/dL 101(H) 106(H) 105(H)  BUN 6 - 24 mg/dL 12 10 10   Creatinine 0.57 - 1.00 mg/dL 0.71 0.85 0.82  Sodium 134 - 144 mmol/L 137 137 138  Potassium 3.5 - 5.2 mmol/L 3.4(L) 3.3(L) 3.6  Chloride 96 - 106 mmol/L 99 99 99  CO2 20 - 29 mmol/L 21 27 23   Calcium 8.7 - 10.2 mg/dL 10.5(H) 9.9 10.6(H)  Total Protein 6.5 - 8.1 g/dL - - -  Total Bilirubin 0.3 - 1.2 mg/dL - - -  Alkaline Phos 38 - 126 U/L - - -  AST 15 - 41 U/L - - -  ALT 14 - 54 U/L - - -    Lipid Panel  No results found for: CHOL, TRIG, HDL, CHOLHDL, VLDL, LDLCALC, LDLDIRECT  CBC    Component Value Date/Time   WBC 8.5 04/22/2018 1226   RBC 4.52 04/22/2018 1226   HGB 13.8 04/22/2018 1226   HCT 43.2 04/22/2018 1226   PLT 195  04/22/2018 1226   MCV 95.6 04/22/2018 1226   MCH 30.5 04/22/2018 1226   MCHC 31.9 04/22/2018 1226   RDW 12.6 04/22/2018 1226  LYMPHSABS 1.6 08/02/2010 1606   MONOABS 0.5 08/02/2010 1606   EOSABS 0.3 08/02/2010 1606   BASOSABS 0.0 08/02/2010 1606    No results found for: HGBA1C  Assessment & Plan:  1. Lumbar radiculopathy Uncontrolled We will place on short course of prednisone and referred back to the spine specialist for possible epidural spinal injection Discussed possibility of physical therapy referral which she declines We will restart her medications again - AMB referral to orthopedics - predniSONE (DELTASONE) 20 MG tablet; Take 1 tablet (20 mg total) by mouth daily with breakfast.  Dispense: 5 tablet; Refill: 0 - pregabalin (LYRICA) 50 MG capsule; Take 1 capsule (50 mg total) by mouth 2 (two) times daily.  Dispense: 60 capsule; Refill: 1 - cyclobenzaprine (FLEXERIL) 10 MG tablet; Take 1 tablet (10 mg total) by mouth 2 (two) times daily as needed for muscle spasms.  Dispense: 60 tablet; Refill: 1  2. Left foot pain Secondary to #1 above - pregabalin (LYRICA) 50 MG capsule; Take 1 capsule (50 mg total) by mouth 2 (two) times daily.  Dispense: 60 capsule; Refill: 1 - cyclobenzaprine (FLEXERIL) 10 MG tablet; Take 1 tablet (10 mg total) by mouth 2 (two) times daily as needed for muscle spasms.  Dispense: 60 tablet; Refill: 1    Meds ordered this encounter  Medications  . predniSONE (DELTASONE) 20 MG tablet    Sig: Take 1 tablet (20 mg total) by mouth daily with breakfast.    Dispense:  5 tablet    Refill:  0  . pregabalin (LYRICA) 50 MG capsule    Sig: Take 1 capsule (50 mg total) by mouth 2 (two) times daily.    Dispense:  60 capsule    Refill:  1  . cyclobenzaprine (FLEXERIL) 10 MG tablet    Sig: Take 1 tablet (10 mg total) by mouth 2 (two) times daily as needed for muscle spasms.    Dispense:  60 tablet    Refill:  1    Follow-up: Return in about 1 month (around  12/04/2019) for PCP -chronic disease management.       Charlott Rakes, MD, FAAFP. [facility] [place], [place]   11/04/2019, 4:50 PM

## 2019-11-27 ENCOUNTER — Ambulatory Visit: Payer: Self-pay

## 2019-11-27 ENCOUNTER — Ambulatory Visit (INDEPENDENT_AMBULATORY_CARE_PROVIDER_SITE_OTHER): Payer: 59 | Admitting: Physical Medicine and Rehabilitation

## 2019-11-27 ENCOUNTER — Encounter: Payer: Self-pay | Admitting: Physical Medicine and Rehabilitation

## 2019-11-27 ENCOUNTER — Other Ambulatory Visit: Payer: Self-pay

## 2019-11-27 VITALS — BP 178/102 | HR 91

## 2019-11-27 DIAGNOSIS — M5416 Radiculopathy, lumbar region: Secondary | ICD-10-CM

## 2019-11-27 DIAGNOSIS — M48062 Spinal stenosis, lumbar region with neurogenic claudication: Secondary | ICD-10-CM | POA: Diagnosis not present

## 2019-11-27 DIAGNOSIS — D1779 Benign lipomatous neoplasm of other sites: Secondary | ICD-10-CM | POA: Diagnosis not present

## 2019-11-27 DIAGNOSIS — M5442 Lumbago with sciatica, left side: Secondary | ICD-10-CM | POA: Diagnosis not present

## 2019-11-27 DIAGNOSIS — G8929 Other chronic pain: Secondary | ICD-10-CM

## 2019-11-27 MED ORDER — METHYLPREDNISOLONE ACETATE 80 MG/ML IJ SUSP
80.0000 mg | Freq: Once | INTRAMUSCULAR | Status: AC
  Administered 2019-11-27: 80 mg

## 2019-11-27 NOTE — Progress Notes (Signed)
Pt state left leg pain that travels to her foot. Pt state walking and standing for a long time makes the pain worse. Pt state taking pain med and using ice packs to helps ease the pain.  Numeric Pain Rating Scale and Functional Assessment Average Pain 10   In the last MONTH (on 0-10 scale) has pain interfered with the following?  1. General activity like being  able to carry out your everyday physical activities such as walking, climbing stairs, carrying groceries, or moving a chair?  Rating(10)   +Driver, -BT, -Dye Allergies.

## 2019-11-27 NOTE — Progress Notes (Signed)
Evelyn Crawford - 59 y.o. female MRN [id number]  Date of birth: 05/20/1960  Office Visit Note: Visit Date: 11/27/2019 PCP: Antony Blackbird, MD Referred by: Antony Blackbird, MD  Subjective: Chief Complaint  Patient presents with  . Left Leg - Pain  . Left Foot - Pain   HPI:  Evelyn Crawford is a 59 y.o. female who comes in today For evaluation and management of chronic worsening severe left hip and leg pain with paresthesia into the foot.  Symptoms are in a pretty classic L5 distribution to the top of the foot big toe.  She reports 10 out of 10 severe pain particularly with walking and standing.  She reports that pain medication and using ice helps temporarily.  Our notes and the patient show that she has been having this pain really as far back as November of last year.  She had an initial consultation scheduled with [place] but missed the appointment and was also having insurance difficulties with [facility].  The initial referral was to Dr. Eunice Blase through her primary care provider Dr. Chapman Fitch.  Ultimately Dr. Junius Roads did refer to [place].  Prior to that referral he did obtain MRI of the lumbar spine.  This was reviewed with the patient today with images and spine model.  She was also referred at the time to Dr. Ashok Pall at [facility].  She did not see him as she did not really wish to have surgery at that time.  She initially tried tramadol which was not successful and ultimately did obtain some oxycodone that was mildly successful in pain relief.  More recently she was started on pregabalin.  She was seen by Nicki Guadalajara at [facility].  She started her on the pregabalin, oral prednisone and made referral back to [place] for potential interventions.  Also to note the patient really has not started the pregabalin yet as she wanted someone to explain more of why to take it.  Furthermore she is also been seen by Elfredia Nevins, FNP at [place], this is also where I believe Dr. Jori Moll landed.  It appears that he and another anesthesiologist maintain this office.  I did read the nurse practitioner's notes and she recommended transforaminal epidural steroid injection at 2 levels with continued medication as prescribed.  Patient has had activity modification and time in therapy.  MRI reviewed with the patient today does show moderate multifactorial stenosis at L4-5 with significant epidural lipomatosis.  Review of Systems  Musculoskeletal: Positive for back pain.       Left radicular leg pain  Neurological: Positive for tingling.  All other systems reviewed and are negative.  Otherwise per HPI.  Assessment & Plan: Visit Diagnoses:  1. Lumbar radiculopathy   2. Spinal stenosis of lumbar region with neurogenic claudication   3. Chronic left-sided low back pain with left-sided sciatica   4. Epidural lipomatosis     Plan: Findings:  Chronic worsening severe left radicular low back hip and leg pain in a pretty classic L5 distribution.  She has a positive slump test on the left as well as impaired sensation in L5 dermatome.  She has moderate multifactorial stenosis at L4-5 on MRI with epidural lipomatosis.  She is not a very big individual and I am not sure any history of medication uses with her that would give her epidural lipomatosis.  I have seen it in some patients  using insulin in some patients with some type of lipodystrophy do to HIV medication but she is on neither.  Her blood pressure is somewhat high today but she is anxious about any type of injection and her pain level is very high.  I discussed with her at length the injection potential for an epidural injection helping her she does want to proceed.  I think given the amount of pain that she is having in the timeframe that she is waited at this point with nobody really doing it we should do that today we will get that done today.  Her  pain level is quite severe.  This would be an L5 transforaminal injection on the left.  Consider L4 injection.  Depending on relief would look at repeat injection versus spine surgery referral.  1 level decompression probably would help her a great deal.  We talked about pregabalin and she will start that medication.    Meds & Orders:  Meds ordered this encounter  Medications  . methylPREDNISolone acetate (DEPO-MEDROL) injection 80 mg    Orders Placed This Encounter  Procedures  . XR C-ARM NO REPORT  . Epidural Steroid injection    Follow-up: No follow-ups on file.   Procedures: No procedures performed  Lumbosacral Transforaminal Epidural Steroid Injection - Sub-Pedicular Approach with Fluoroscopic Guidance  Patient: Evelyn Crawford      Date of Birth: [birth year] MRN: [id number] PCP: Antony Blackbird, MD      Visit Date: 11/27/2019   Universal Protocol:    Date/Time: 11/27/2019  Consent Given By: the patient  Position: PRONE  Additional Comments: Vital signs were monitored before and after the procedure. Patient was prepped and draped in the usual sterile fashion. The correct patient, procedure, and site was verified.   Injection Procedure Details:   Procedure diagnoses:  1. Lumbar radiculopathy      Meds Administered:  Meds ordered this encounter  Medications  . methylPREDNISolone acetate (DEPO-MEDROL) injection 80 mg    Laterality: Left  Location/Site:  L5-S1  Needle:5.0 in., 22 ga.  Short bevel or Quincke spinal needle  Needle Placement: Transforaminal  Findings:    -Comments: Excellent flow of contrast along the nerve, nerve root and into the epidural space.  Procedure Details: After squaring off the end-plates to get a true AP view, the C-arm was positioned so that an oblique view of the foramen as noted above was visualized. The target area is just inferior to the "nose of the scotty dog" or sub pedicular. The soft tissues overlying this structure were  infiltrated with 2-3 ml. of 1% Lidocaine without Epinephrine.  The spinal needle was inserted toward the target using a "trajectory" view along the fluoroscope beam.  Under AP and lateral visualization, the needle was advanced so it did not puncture dura and was located close the 6 O'Clock position of the pedical in AP tracterory. Biplanar projections were used to confirm position. Aspiration was confirmed to be negative for CSF and/or blood. A 1-2 ml. volume of Isovue-250 was injected and flow of contrast was noted at each level. Radiographs were obtained for documentation purposes.   After attaining the desired flow of contrast documented above, a 0.5 to 1.0 ml test dose of 0.25% Marcaine was injected into each respective transforaminal space.  The patient was observed for 90 seconds post injection.  After no sensory deficits were reported, and normal lower extremity motor function was noted,   the above injectate was administered so that equal amounts of the  injectate were placed at each foramen (level) into the transforaminal epidural space.   Additional Comments:  The patient tolerated the procedure well Dressing: 2 x 2 sterile gauze and Band-Aid    Post-procedure details: Patient was observed during the procedure. Post-procedure instructions were reviewed.  Patient left the clinic in stable condition.      Clinical History: MRI LUMBAR SPINE WITHOUT CONTRAST  TECHNIQUE: Multiplanar, multisequence MR imaging of the lumbar spine was performed. No intravenous contrast was administered.  COMPARISON:  None.  FINDINGS: Segmentation: Normal segmentation. S1 is lumbarized with a well developed S1-2 disc space.  Alignment:  Normal  Vertebrae:  Negative for fracture or mass.  Conus medullaris and cauda equina: Conus extends to the L1-2 level. Conus and cauda equina appear normal.  Paraspinal and other soft tissues: Negative for paraspinous mass or adenopathy  Disc  levels:  L1-2: Negative  L2-3: Negative  L3-4: Mild disc and mild facet degeneration.  Negative for stenosis  L4-5: Mild disc bulge. Moderate facet hypertrophy. Moderate spinal stenosis primarily due to prominent epidural lipomatosis left greater than right which is compressing the thecal sac. Subarticular stenosis bilaterally.  L5-S1: Diffuse bulging of the disc and mild facet degeneration. Epidural lipomatosis most prominent on the left  IMPRESSION: Moderate spinal stenosis L4-5 due to disc and facet degeneration as well as prominent epidural lipomatosis, left greater than right. Epidural fat is compressing the thecal sac, left greater than right  Disc and facet degeneration L5-S1 as well as epidural lipomatosis, left greater than right compressing the distal thecal sac.   Electronically Signed   By: Franchot Gallo M.D.   On: 03/10/2019 17:17     Objective:  VS:  HT:    WT:   BMI:     BP:(!) 178/102  HR:91bpm  TEMP: ( )  RESP:  Physical Exam Constitutional:      General: She is not in acute distress.    Appearance: Normal appearance. She is normal weight. She is not ill-appearing.  HENT:     Head: Normocephalic and atraumatic.     Right Ear: External ear normal.     Left Ear: External ear normal.  Eyes:     Extraocular Movements: Extraocular movements intact.  Cardiovascular:     Rate and Rhythm: Normal rate.     Pulses: Normal pulses.  Pulmonary:     Effort: Pulmonary effort is normal. No respiratory distress.  Abdominal:     General: There is no distension.     Palpations: Abdomen is soft.  Musculoskeletal:        General: Tenderness present. No signs of injury. Normal range of motion.     Right lower leg: No edema.     Left lower leg: No edema.     Comments: Patient has good distal strength with no pain over the greater trochanters.  No clonus or focal weakness.  She has no pain with hip rotation internally bilaterally.  She does have a  positive slump test on the left.  She has L5 dermatomal paresthesia to light touch.  Skin:    Findings: No erythema, lesion or rash.  Neurological:     General: No focal deficit present.     Mental Status: She is alert and oriented to person, place, and time.     Sensory: No sensory deficit.     Motor: No weakness or abnormal muscle tone.     Coordination: Coordination normal.  Psychiatric:        Mood and  Affect: Mood normal.        Behavior: Behavior normal.      Imaging: No results found.

## 2019-12-08 ENCOUNTER — Other Ambulatory Visit: Payer: Self-pay

## 2019-12-22 ENCOUNTER — Ambulatory Visit: Payer: 59 | Attending: Family | Admitting: Family

## 2019-12-22 ENCOUNTER — Other Ambulatory Visit: Payer: Self-pay

## 2019-12-22 ENCOUNTER — Encounter: Payer: Self-pay | Admitting: Family

## 2019-12-22 VITALS — BP 124/85 | HR 76 | Resp 16 | Wt 169.6 lb

## 2019-12-22 DIAGNOSIS — M79672 Pain in left foot: Secondary | ICD-10-CM

## 2019-12-22 DIAGNOSIS — I10 Essential (primary) hypertension: Secondary | ICD-10-CM

## 2019-12-22 NOTE — Patient Instructions (Addendum)
    Referral to Podiatrist.   Follow-up with primary physician as needed.   Foot Pain Many things can cause foot pain. Some common causes are:  An injury.  A sprain.  Arthritis.  Blisters.  Bunions. Follow these instructions at home: Managing pain, stiffness, and swelling If directed, put ice on the painful area:  Put ice in a plastic bag.  Place a towel between your skin and the bag.  Leave the ice on for 20 minutes, 2-3 times a day.  Activity  Do not stand or walk for long periods.  Return to your normal activities as told by your health care provider. Ask your health care provider what activities are safe for you.  Do stretches to relieve foot pain and stiffness as told by your health care provider.  Do not lift anything that is heavier than 10 lb (4.5 kg), or the limit that you are told, until your health care provider says that it is safe. Lifting a lot of weight can put added pressure on your feet. Lifestyle  Wear comfortable, supportive shoes that fit you well. Do not wear high heels.  Keep your feet clean and dry. General instructions  Take over-the-counter and prescription medicines only as told by your health care provider.  Rub your foot gently.  Pay attention to any changes in your symptoms.  Keep all follow-up visits as told by your health care provider. This is important. Contact a health care provider if:  Your pain does not get better after a few days of self-care.  Your pain gets worse.  You cannot stand on your foot. Get help right away if:  Your foot is numb or tingling.  Your foot or toes are swollen.  Your foot or toes turn white or blue.  You have warmth and redness along your foot. Summary  Common causes of foot pain are injury, sprain, arthritis, blisters or bunions.  Ice, medicines, and comfortable shoes may help foot pain.  Contact your health care provider if your pain does not get better after a few days of  self-care. This information is not intended to replace advice given to you by your health care provider. Make sure you discuss any questions you have with your health care provider. Document Revised: 11/08/2017 Document Reviewed: 11/08/2017 Elsevier Patient Education  [place].

## 2019-12-22 NOTE — Progress Notes (Signed)
Patient ID: Evelyn Crawford, female    DOB: 1960-05-17  MRN: [id number]  CC: Left Foot Pain  Subjective: Evelyn Crawford is a 59 y.o. female with history of fibroids uterus, anemia iron deficiency, alcohol abuse, and tobacco abuse who presents for left foot pain.  1. FOOT PAIN: 05/28/2019: Visit with podiatrist Dr. Paulla Dolly. X-ray indicates that there is no signs of spur or bony injury associated with this. Sterile prep done and carefully injected the extensor complex left 3 mg Dexasone Kenalog and went ahead and debrided the lesion we will see when it reoccurs and it may ultimately require wide excision if it remains aggravating her.   11/04/2019: Visit with Dr. Margarita Rana. Left foot pain secondary to lumbar radiculopathy. Pregabalin and Cyclobenzaprine prescribed.   12/22/2019: Duration: chronic Involved foot: left Location: anterior foot Quality:  numb, sharp and tender, foot feels cold Frequency: constant Radiation: yes, left leg Aggravating factors: weight bearing  Alleviating factors: nothing  Status: worse Swelling: sometimes Redness: no Bruising: no Paresthesias / decreased sensation: yes  Report this has been reoccurring for years. Says years ago went to the ED where it was cleaned out and sutures applied the discharged home. Can only wear bedroom shoes but does have to wear sneakers to work where she stands on her feet all day. Not taking Pregabalin and Cyclobenzaprine because she does not like taking medications.    Patient Active Problem List   Diagnosis Date Noted  . FIBROIDS, UTERUS 11/15/2009  . ANEMIA, IRON DEFICIENCY 11/15/2009  . ALCOHOL ABUSE 11/15/2009  . TOBACCO ABUSE 11/15/2009     Current Outpatient Medications on File Prior to Visit  Medication Sig Dispense Refill  . albuterol (VENTOLIN HFA) 108 (90 Base) MCG/ACT inhaler Inhale 1-2 puffs into the lungs every 6 (six) hours as needed for wheezing or shortness of breath. 1 Inhaler 3  . aspirin 325 MG tablet  Take 325 mg by mouth every 6 (six) hours as needed for moderate pain.     . cyclobenzaprine (FLEXERIL) 10 MG tablet Take 1 tablet (10 mg total) by mouth 2 (two) times daily as needed for muscle spasms. 60 tablet 1  . diazepam (VALIUM) 5 MG tablet Take 1 by mouth 1 hour  pre-procedure with very light food. May bring 2nd tablet to appointment. 2 tablet 0  . diclofenac (VOLTAREN) 75 MG EC tablet Take 1 tablet (75 mg total) by mouth 2 (two) times daily. 60 tablet 0  . diclofenac Sodium (VOLTAREN) 1 % GEL Apply 2 g topically 4 (four) times daily. 50 g 0  . ferrous sulfate 325 (65 FE) MG tablet Take 325 mg by mouth 3 (three) times daily with meals. Reported on 01/23/2015    . hydrochlorothiazide (HYDRODIURIL) 25 MG tablet Take 1 tablet (25 mg total) by mouth daily. 90 tablet 1  . nabumetone (RELAFEN) 500 MG tablet Take 1 tablet (500 mg total) by mouth 2 (two) times daily as needed for moderate pain. Take after eating 60 tablet 3  . oxyCODONE (ROXICODONE) 5 MG immediate release tablet Take 1 tablet (5 mg total) by mouth every 6 (six) hours as needed for severe pain or breakthrough pain. 15 tablet 0  . predniSONE (DELTASONE) 20 MG tablet Take 1 tablet (20 mg total) by mouth daily with breakfast. 5 tablet 0  . pregabalin (LYRICA) 50 MG capsule Take 1 capsule (50 mg total) by mouth 2 (two) times daily. 60 capsule 1  . traMADol (ULTRAM) 50 MG tablet One every 6-8 hours  as needed for pain 90 tablet 0   Current Facility-Administered Medications on File Prior to Visit  Medication Dose Route Frequency Provider Last Rate Last Admin  . methylPREDNISolone acetate (DEPO-MEDROL) injection 80 mg  80 mg Other Once Magnus Sinning, MD        No Known Allergies  Social History   Socioeconomic History  . Marital status: Single    Spouse name: Not on file  . Number of children: Not on file  . Years of education: Not on file  . Highest education level: Not on file  Occupational History  . Not on file  Tobacco  Use  . Smoking status: Current Every Day Smoker    Packs/day: 0.25    Types: Cigarettes  . Smokeless tobacco: Never Used  Vaping Use  . Vaping Use: Never used  Substance and Sexual Activity  . Alcohol use: Yes    Comment: daily 24 ounce beer every night  . Drug use: No  . Sexual activity: Yes    Birth control/protection: Post-menopausal  Other Topics Concern  . 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  Lives with grown daughter.  Pt is single, sexually active with men only.          Social Determinants of Health   Financial Resource Strain:   . Difficulty of Paying Living Expenses: Not on file  Food Insecurity:   . Worried About Charity fundraiser in the Last Year: Not on file  . Ran Out of Food in the Last Year: Not on file  Transportation Needs:   . Lack of Transportation (Medical): Not on file  . Lack of Transportation (Non-Medical): Not on file  Physical Activity:   . Days of Exercise per Week: Not on file  . Minutes of Exercise per Session: Not on file  Stress:   . Feeling of Stress : Not on file  Social Connections:   . Frequency of Communication with Friends and Family: Not on file  . Frequency of Social Gatherings with Friends and Family: Not on file  . Attends Religious Services: Not on file  . Active Member of Clubs or Organizations: Not on file  . Attends Archivist Meetings: Not on file  . Marital Status: Not on file  Intimate Partner Violence:   . Fear of Current or Ex-Partner: Not on file  . Emotionally Abused: Not on file  . Physically Abused: Not on file  . Sexually Abused: Not on file    Family History  Problem Relation Age of Onset  . Fibroids Mother   . Fibroids Sister     Past Surgical History:  Procedure Laterality Date  .  caesarean section     x3  . CESAREAN SECTION     3X    ROS: Review of Systems Negative except as stated above  PHYSICAL EXAM: BP 124/85   Pulse 76   Resp 16   Wt 169 lb 9.6 oz (76.9 kg)   LMP 09/17/2010   SpO2 98%   BMI 27.37 kg/m   Physical Exam Constitutional:      Appearance: Normal appearance.  Cardiovascular:     Rate and Rhythm: Normal rate and regular rhythm.  Pulmonary:  Effort: Pulmonary effort is normal.     Breath sounds: Normal breath sounds.  Skin:    General: Skin is dry.     Findings: Lesion present.     Comments: See picture.  Neurological:     General: No focal deficit present.     Mental Status: She is alert.     Sensory: Sensory deficit present.     Comments: Left foot sensory deficit.     ASSESSMENT AND PLAN: 1. Left foot pain: - Last visit with Podiatry on 05/28/2019. X-ray indicated that there is no signs of spur or bony injury associated with this. Debridement of the lesion at that time. Plan tosee when it reoccurs and it may ultimately require wide excision if it remains aggravating her. - Patient declines pharmacological therapy at this time.  - Ambulatory referral to Podiatry  -Follow-up with primary physician as scheduled.    Patient was given the opportunity to ask questions.  Patient verbalized understanding of the plan and was able to repeat key elements of the plan. Patient was given clear instructions to go to Emergency Department or return to medical center if symptoms don't improve, worsen, or new problems develop.The patient verbalized understanding.   Orders Placed This Encounter  Procedures  . Ambulatory referral to Podiatry     Requested Prescriptions    No prescriptions requested or ordered in this encounter    Kishana Battey Zachery Dauer, NP

## 2019-12-29 ENCOUNTER — Other Ambulatory Visit: Payer: Self-pay

## 2019-12-29 ENCOUNTER — Ambulatory Visit (INDEPENDENT_AMBULATORY_CARE_PROVIDER_SITE_OTHER): Payer: 59

## 2019-12-29 ENCOUNTER — Ambulatory Visit (INDEPENDENT_AMBULATORY_CARE_PROVIDER_SITE_OTHER): Payer: 59 | Admitting: Podiatry

## 2019-12-29 DIAGNOSIS — D367 Benign neoplasm of other specified sites: Secondary | ICD-10-CM

## 2019-12-29 DIAGNOSIS — M778 Other enthesopathies, not elsewhere classified: Secondary | ICD-10-CM

## 2019-12-30 ENCOUNTER — Encounter: Payer: Self-pay | Admitting: Podiatry

## 2020-01-01 ENCOUNTER — Other Ambulatory Visit: Payer: Self-pay | Admitting: Family Medicine

## 2020-01-01 DIAGNOSIS — I10 Essential (primary) hypertension: Secondary | ICD-10-CM

## 2020-01-04 ENCOUNTER — Encounter: Payer: Self-pay | Admitting: Physical Medicine and Rehabilitation

## 2020-01-04 NOTE — Procedures (Signed)
Lumbosacral Transforaminal Epidural Steroid Injection - Sub-Pedicular Approach with Fluoroscopic Guidance  Patient: Evelyn Crawford      Date of Birth: [birth year] MRN: [id number] PCP: Antony Blackbird, MD      Visit Date: 11/27/2019   Universal Protocol:    Date/Time: 11/27/2019  Consent Given By: the patient  Position: PRONE  Additional Comments: Vital signs were monitored before and after the procedure. Patient was prepped and draped in the usual sterile fashion. The correct patient, procedure, and site was verified.   Injection Procedure Details:   Procedure diagnoses:  1. Lumbar radiculopathy      Meds Administered:  Meds ordered this encounter  Medications  . methylPREDNISolone acetate (DEPO-MEDROL) injection 80 mg    Laterality: Left  Location/Site:  L5-S1  Needle:5.0 in., 22 ga.  Short bevel or Quincke spinal needle  Needle Placement: Transforaminal  Findings:    -Comments: Excellent flow of contrast along the nerve, nerve root and into the epidural space.  Procedure Details: After squaring off the end-plates to get a true AP view, the C-arm was positioned so that an oblique view of the foramen as noted above was visualized. The target area is just inferior to the "nose of the scotty dog" or sub pedicular. The soft tissues overlying this structure were infiltrated with 2-3 ml. of 1% Lidocaine without Epinephrine.  The spinal needle was inserted toward the target using a "trajectory" view along the fluoroscope beam.  Under AP and lateral visualization, the needle was advanced so it did not puncture dura and was located close the 6 O'Clock position of the pedical in AP tracterory. Biplanar projections were used to confirm position. Aspiration was confirmed to be negative for CSF and/or blood. A 1-2 ml. volume of Isovue-250 was injected and flow of contrast was noted at each level. Radiographs were obtained for documentation purposes.   After attaining the desired  flow of contrast documented above, a 0.5 to 1.0 ml test dose of 0.25% Marcaine was injected into each respective transforaminal space.  The patient was observed for 90 seconds post injection.  After no sensory deficits were reported, and normal lower extremity motor function was noted,   the above injectate was administered so that equal amounts of the injectate were placed at each foramen (level) into the transforaminal epidural space.   Additional Comments:  The patient tolerated the procedure well Dressing: 2 x 2 sterile gauze and Band-Aid    Post-procedure details: Patient was observed during the procedure. Post-procedure instructions were reviewed.  Patient left the clinic in stable condition.

## 2020-01-04 NOTE — Progress Notes (Signed)
Subjective:   Patient ID: Evelyn Crawford, female   DOB: 59 y.o.   MRN: [id number]   HPI Patient presents with moderate discomfort dorsal left foot and also lesions underneath both feet that can be tender at times.  No other change in health history is not currently smoking   ROS      Objective:  Physical Exam  Tendinitis of the dorsum of both feet with inflammation of a low-grade to mid grade nature with mild swelling with lesion formation upon palpation     Assessment:  Tendinitis with lesion formation     Plan:  H&P reviewed condition and I have recommended that simple debridement and oral medication be utilized at this point with ice therapy and topical medicine.  Reappoint as needed  X-rays currently are negative for signs of fracture or bone pathology

## 2020-01-06 ENCOUNTER — Telehealth: Payer: Self-pay

## 2020-01-06 NOTE — Telephone Encounter (Signed)
DOS 01/22/2020 - OFFICE SURGERY   EXC BENIGN LESION LT - 11426  RECEIVED AUTH FAX FROM [facility] STATING NO PRECERT REQUIRED FOR CPT [phone]. REF # [id number]

## 2020-01-22 ENCOUNTER — Ambulatory Visit (INDEPENDENT_AMBULATORY_CARE_PROVIDER_SITE_OTHER): Payer: 59 | Admitting: Podiatry

## 2020-01-22 ENCOUNTER — Other Ambulatory Visit: Payer: Self-pay

## 2020-01-22 ENCOUNTER — Encounter: Payer: Self-pay | Admitting: Podiatry

## 2020-01-22 DIAGNOSIS — M7989 Other specified soft tissue disorders: Secondary | ICD-10-CM | POA: Diagnosis not present

## 2020-01-22 NOTE — Progress Notes (Signed)
Subjective:   Patient ID: Evelyn Crawford, female   DOB: 59 y.o.   MRN: [id number]   HPI Patient presents for surgical excision of soft tissue mass dorsum left foot   ROS      Objective:  Physical Exam  Neurovascular status intact with patient's dorsal left foot showing a mass on the 1st metatarsal shaft medial to the extensor hallucis longus tendon hard in nature and painful     Assessment:  Unknown soft tissue mass dorsal left foot     Plan:  H&P done patient brought to the OR anesthetized with 120 mg Xylocaine Marcaine mixture.  Patient's left foot prepped and draped utilizing standard aseptic technique and left foot exsanguinated utilizing Ace wrap.  I did a wide excision removal of mass of approximate 4.5 cm encompassing the entire mass.  I removed this and sent for pathological evaluation.  I then evaluated the tissue I did not note any other further abnormal tissue I did not note any pathology.  I flushed copiously Garamycin solution and sutured with 5-0 nylon.  I then applied sterile dressing and tourniquet released capillary fill to be immediate to all digits on the toes instructed on elevation and reappoint 2 weeks for probable suture removal or earlier if needed

## 2020-02-05 ENCOUNTER — Ambulatory Visit (INDEPENDENT_AMBULATORY_CARE_PROVIDER_SITE_OTHER): Payer: 59 | Admitting: Podiatry

## 2020-02-05 ENCOUNTER — Encounter: Payer: Self-pay | Admitting: Podiatry

## 2020-02-05 ENCOUNTER — Other Ambulatory Visit: Payer: Self-pay

## 2020-02-05 DIAGNOSIS — M7989 Other specified soft tissue disorders: Secondary | ICD-10-CM

## 2020-02-05 NOTE — Progress Notes (Signed)
Subjective:   Patient ID: Evelyn Crawford, female   DOB: 59 y.o.   MRN: [id number]   HPI Patient states she is doing fine with her left foot and is healing well with mild discomfort   ROS      Objective:  Physical Exam  Neurovascular status intact complete excision of mass which turned out to be either some form of fibrous mass or possible verruca with wound edges well coapted stitches intact     Assessment:  Doing well post removal of mass from dorsum left foot     Plan:  Stitches removed sterile dressing applied continue with open toed shoe gear usage for the next couple weeks and this should heal uneventfully.  Patient may return to work in the next 2 weeks is encouraged to call with questions concerns

## 2020-03-28 ENCOUNTER — Other Ambulatory Visit: Payer: Self-pay | Admitting: Family

## 2020-03-28 DIAGNOSIS — I10 Essential (primary) hypertension: Secondary | ICD-10-CM

## 2020-03-31 NOTE — Telephone Encounter (Signed)
Last visit  11.15.21

## 2020-03-31 NOTE — Telephone Encounter (Signed)
Hydrochlorothiazide courtesy refill for 30 days. Please schedule an office visit for additional refills.

## 2020-04-24 ENCOUNTER — Other Ambulatory Visit: Payer: Self-pay | Admitting: Family

## 2020-04-24 DIAGNOSIS — I10 Essential (primary) hypertension: Secondary | ICD-10-CM

## 2020-05-14 ENCOUNTER — Other Ambulatory Visit: Payer: Self-pay | Admitting: Family

## 2020-05-14 ENCOUNTER — Telehealth: Payer: Self-pay | Admitting: Family Medicine

## 2020-05-14 DIAGNOSIS — I10 Essential (primary) hypertension: Secondary | ICD-10-CM

## 2020-05-14 MED ORDER — HYDROCHLOROTHIAZIDE 25 MG PO TABS: 25.0000 mg | ORAL_TABLET | Freq: Every day | ORAL | 0 refills | Status: DC

## 2020-05-14 NOTE — Telephone Encounter (Signed)
Pt is re establishing with [facility]. She is requesting a courtesy fill until her appt on 05/26/20

## 2020-05-14 NOTE — Telephone Encounter (Signed)
Pt states she is completely out of her BP medication and is requesting a temporary refill if possible until her next visit. Pt highlights she has nothing left for the weekend. Pt request refill sent to [facility] on [address].    hydrochlorothiazide (HYDRODIURIL) 25 MG tablet [[id number]] ENDED Please advise and thank you

## 2020-05-14 NOTE — Addendum Note (Signed)
Addended by: Camillia Herter on: 05/14/2020 09:21 PM   Modules accepted: Orders

## 2020-05-14 NOTE — Telephone Encounter (Signed)
First 30 day courtesy refill on 03/31/2020.  Second 30 day courtesy refill on 05/14/2020.  Please schedule office visit for additional refills.

## 2020-05-25 NOTE — Progress Notes (Signed)
Patient ID: Evelyn Crawford, female    DOB: 1961/01/18  MRN: [id number]  CC: Hypertension Follow-Up  Subjective: Evelyn Crawford is a 60 y.o. female who presents for hypertension follow-up.  Her concerns today include: Patient requesting to transfer care to [place]. She lives near that location. She is taking public transportation and reports she does not have long to stay for today's appointment.    1. HYPERTENSION: Currently taking: see medication list Med Adherence: [x]  Yes    []  No Medication side effects: []  Yes    [x]  No Adherence with salt restriction (low-salt diet): []  Yes    [x]  No Exercise: Yes []  No [x]  Home Monitoring?: []  Yes    [x]  No Monitoring Frequency: []  Yes    [x]  No Home BP results range: []  Yes    [x]  No Smoking [x]  Yes 4 to 7 cigarettes daily  SOB? []  Yes    [x]  No Chest Pain?: []  Yes    [x]  No Leg swelling?: [x]  Yes, chronic Headaches?: []  Yes    [x]  No Dizziness? []  Yes    [x]  No Comments: Refuses labs today because she has to leave for her ride.     Patient Active Problem List   Diagnosis Date Noted  . FIBROIDS, UTERUS 11/15/2009  . ANEMIA, IRON DEFICIENCY 11/15/2009  . ALCOHOL ABUSE 11/15/2009  . TOBACCO ABUSE 11/15/2009     Current Outpatient Medications on File Prior to Visit  Medication Sig Dispense Refill  . ferrous sulfate 325 (65 FE) MG tablet Take 325 mg by mouth 3 (three) times daily with meals. Reported on 01/23/2015 (Patient not taking: Reported on 05/26/2020)    . traMADol (ULTRAM) 50 MG tablet One every 6-8 hours as needed for pain (Patient not taking: Reported on 05/26/2020) 90 tablet 0   No current facility-administered medications on file prior to visit.    No Known Allergies  Social History   Socioeconomic History  . Marital status: Single    Spouse name: Not on file  . Number of children: Not on file  . Years of education: Not on file  . Highest education level: Not on file   Occupational History  . Not on file  Tobacco Use  . Smoking status: Current Every Day Smoker    Packs/day: 0.25    Types: Cigarettes  . Smokeless tobacco: Never Used  Vaping Use  . Vaping Use: Never used  Substance and Sexual Activity  . Alcohol use: Yes    Comment: daily 24 ounce beer every night  . Drug use: No  . Sexual activity: Yes    Birth control/protection: Post-menopausal  Other Topics Concern  . 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  Lives with grown daughter.  Pt is single, sexually active with men only.          Social Determinants of Health   Financial Resource Strain: Not on file  Food Insecurity: Not on file  Transportation Needs: Not on file  Physical Activity: Not on file  Stress: Not on file  Social Connections: Not on file  Intimate Partner Violence: Not on file    Family History  Problem Relation Age of Onset  .  Fibroids Mother   . Fibroids Sister     Past Surgical History:  Procedure Laterality Date  . caesarean section     x3  . CESAREAN SECTION     3X    ROS: Review of Systems Negative except as stated above  PHYSICAL EXAM: BP 136/73 (BP Location: Left Arm, Patient Position: Sitting)   Pulse 90   Ht 5' 5.98" (1.676 m)   Wt 161 lb 12.8 oz (73.4 kg)   LMP 09/17/2010   SpO2 96%   BMI 26.13 kg/m   Physical Exam HENT:     Head: Normocephalic and atraumatic.  Eyes:     Extraocular Movements: Extraocular movements intact.     Conjunctiva/sclera: Conjunctivae normal.     Pupils: Pupils are equal, round, and reactive to light.  Cardiovascular:     Rate and Rhythm: Normal rate and regular rhythm.     Pulses: Normal pulses.     Heart sounds: Normal heart sounds.  Pulmonary:     Effort: Pulmonary effort is normal.     Breath  sounds: Normal breath sounds.  Musculoskeletal:     Cervical back: Normal range of motion and neck supple.  Neurological:     General: No focal deficit present.     Mental Status: She is alert and oriented to person, place, and time.  Psychiatric:        Mood and Affect: Mood normal.        Behavior: Behavior normal.    ASSESSMENT AND PLAN: 1. Essential hypertension: - Blood pressure at goal during today's visit.  - Level of blood pressure control unknown as patient does not monitor blood pressure at home.  - Continue Hydrochlorothiazide as prescribed.  - Counseled on blood pressure goal of less than 130/80, low-sodium, DASH diet, medication compliance, 150 minutes of moderate intensity exercise per week as tolerated. Discussed medication compliance, adverse effects. - Patient requesting to transfer care to [place]. She lives near that location. She is taking public transportation and reports she does not have long to stay for today's appointment. Refuses labs today because she has to leave for her ride.   - Follow-up with primary provider in 3 months or sooner if needed.  - hydrochlorothiazide (HYDRODIURIL) 25 MG tablet; Take 1 tablet (25 mg total) by mouth daily.  Dispense: 90 tablet; Refill: 0    Patient was given the opportunity to ask questions.  Patient verbalized understanding of the plan and was able to repeat key elements of the plan. Patient was given clear instructions to go to Emergency Department or return to medical center if symptoms don't improve, worsen, or new problems develop.The patient verbalized understanding.   Requested Prescriptions   Signed Prescriptions Disp Refills  . hydrochlorothiazide (HYDRODIURIL) 25 MG tablet 90 tablet 0    Sig: Take 1 tablet (25 mg total) by mouth daily.    Return in about 3 months (around 08/25/2020) for Follow-Up hypertension [place] provider .  Camillia Herter, NP

## 2020-05-26 ENCOUNTER — Encounter: Payer: Self-pay | Admitting: Family

## 2020-05-26 ENCOUNTER — Other Ambulatory Visit: Payer: Self-pay

## 2020-05-26 ENCOUNTER — Ambulatory Visit (INDEPENDENT_AMBULATORY_CARE_PROVIDER_SITE_OTHER): Payer: 59 | Admitting: Family

## 2020-05-26 VITALS — BP 136/73 | HR 90 | Ht 65.98 in | Wt 161.8 lb

## 2020-05-26 DIAGNOSIS — I10 Essential (primary) hypertension: Secondary | ICD-10-CM | POA: Diagnosis not present

## 2020-05-26 MED ORDER — HYDROCHLOROTHIAZIDE 25 MG PO TABS
25.0000 mg | ORAL_TABLET | Freq: Every day | ORAL | 0 refills | Status: DC
  Filled 2020-05-26: qty 30, 30d supply, fill #0
  Filled 2020-06-03: qty 90, 90d supply, fill #0

## 2020-05-26 NOTE — Progress Notes (Signed)
HTN f/u  Dealing w/Pinched nerve in left leg since 2020 causing pain, has tried multiple meds did not work

## 2020-05-26 NOTE — Patient Instructions (Signed)

## 2020-05-27 ENCOUNTER — Other Ambulatory Visit: Payer: Self-pay

## 2020-05-28 ENCOUNTER — Ambulatory Visit: Payer: Self-pay | Admitting: *Deleted

## 2020-05-28 NOTE — Telephone Encounter (Signed)
Evelyn Crawford,   Please schedule patient appointment/follow-up visit with a provider located at [place] as this is the patient's preference. Please refer to the note from my last visit with patient on 05/26/2020.  Thank you

## 2020-05-28 NOTE — Telephone Encounter (Signed)
  C/o left leg pain worsening and numb, aches, burns and "stings" to touch. Reports "knot" noted to outer left leg above ankle that is increasing painful. Patient reports she has to limp to walk and tramadol is not relieving pain, or burning sensation. Reports she does lift heavy boxes at work but pain has been noted since 2020 in left leg. Now pain and burning is worse and affecting ADL. Earliest appt scheduled for 07/08/20. Encouraged to go to mobile unit tomorrow between 1-5p. Care advise given. Patent verbalized understanding of care advise and to call back or go to ED if symptoms worsen.   Reason for Disposition . [1] MODERATE pain (e.g., interferes with normal activities, limping) AND [2] present > 3 days  Answer Assessment - Initial Assessment Questions 1. ONSET: "When did the pain start?"      Back in 2020 now getting worse 2. LOCATION: "Where is the pain located?"      Left leg outer area above ankle. "knot" noted and painful 3. PAIN: "How bad is the pain?"    (Scale 1-10; or mild, moderate, severe)   -  MILD (1-3): doesn't interfere with normal activities    -  MODERATE (4-7): interferes with normal activities (e.g., work or school) or awakens from sleep, limping    -  SEVERE (8-10): excruciating pain, unable to do any normal activities, unable to walk     moderate 4. WORK OR EXERCISE: "Has there been any recent work or exercise that involved this part of the body?"      Lifts heavy boxes at work 5. CAUSE: "What do you think is causing the leg pain?"     Not sure has been assessed but no relief of pain 6. OTHER SYMPTOMS: "Do you have any other symptoms?" (e.g., chest pain, back pain, breathing difficulty, swelling, rash, fever, numbness, weakness)     Left leg aches, numb, burns , stings to touch 7. PREGNANCY: "Is there any chance you are pregnant?" "When was your last menstrual period?"     na  Protocols used: LEG PAIN-A-AH

## 2020-05-31 ENCOUNTER — Emergency Department (HOSPITAL_COMMUNITY)
Admission: EM | Admit: 2020-05-31 | Discharge: 2020-05-31 | Disposition: A | Discharge status: Home / Self Care | Payer: 59 | Attending: Emergency Medicine | Admitting: Emergency Medicine

## 2020-05-31 ENCOUNTER — Other Ambulatory Visit: Payer: Self-pay

## 2020-05-31 ENCOUNTER — Encounter (HOSPITAL_COMMUNITY): Payer: Self-pay | Admitting: *Deleted

## 2020-05-31 DIAGNOSIS — M79605 Pain in left leg: Secondary | ICD-10-CM | POA: Diagnosis present

## 2020-05-31 DIAGNOSIS — F17210 Nicotine dependence, cigarettes, uncomplicated: Secondary | ICD-10-CM | POA: Diagnosis not present

## 2020-05-31 DIAGNOSIS — G8929 Other chronic pain: Secondary | ICD-10-CM | POA: Insufficient documentation

## 2020-05-31 DIAGNOSIS — I10 Essential (primary) hypertension: Secondary | ICD-10-CM | POA: Insufficient documentation

## 2020-05-31 DIAGNOSIS — Z79899 Other long term (current) drug therapy: Secondary | ICD-10-CM | POA: Diagnosis not present

## 2020-05-31 DIAGNOSIS — R202 Paresthesia of skin: Secondary | ICD-10-CM | POA: Insufficient documentation

## 2020-05-31 DIAGNOSIS — M5442 Lumbago with sciatica, left side: Secondary | ICD-10-CM | POA: Diagnosis not present

## 2020-05-31 MED ORDER — OXYCODONE-ACETAMINOPHEN 5-325 MG PO TABS
1.0000 | ORAL_TABLET | Freq: Once | ORAL | Status: AC
  Administered 2020-05-31: 1 via ORAL
  Filled 2020-05-31: qty 1

## 2020-05-31 MED ORDER — CYCLOBENZAPRINE HCL 10 MG PO TABS: 10.0000 mg | ORAL_TABLET | Freq: Two times a day (BID) | ORAL | 0 refills | Status: AC | PRN

## 2020-05-31 MED ORDER — PREDNISONE 20 MG PO TABS: 40.0000 mg | ORAL_TABLET | Freq: Every day | ORAL | 0 refills | Status: AC

## 2020-05-31 NOTE — ED Provider Notes (Signed)
- [facility] DEPT Provider Note   CSN: [id number] Arrival date & time: 05/31/20  1525     History Chief Complaint  Patient presents with  . Leg Pain    Evelyn Crawford is a 60 y.o. female.  60 y.o female with a PMH of Alcohol Abuse, HTN, Thrombocytopenia presents to the ED with a chief complaint of left leg pain x 2 years.  Patient reports this pain has been ongoing, describing as a sharp shooting sensation originating from her left buttocks radiating down her left leg.  Pain is exacerbated with ambulation, sitting down, most movements.  She did have an MRI last year, which showed spinal stenosis L4-L5, reports taking gabapentin, steroids, and hydrocodone without improvement in symptoms.  She was placed on multiple medications along with muscle relaxers which did not help with her pain.  Patient reports recently she has been trying to treat her pain with Tylenol, ibuprofen without much improvement in her symptoms.  She denies any new injury, bowel or bladder incontinence, fever or other complaints.No prior hx of CA, or IV drug use.   The history is provided by the patient.  Leg Pain Location:  Leg, hip and buttock Injury: no   Hip location:  L hip Buttock location:  L buttock Leg location:  L leg Pain details:    Quality:  Sharp   Radiates to:  L leg   Severity:  Moderate   Onset quality:  Gradual Chronicity:  Chronic Relieved by:  Nothing Worsened by:  Activity Associated symptoms: back pain and decreased ROM   Associated symptoms: no fever        Past Medical History:  Diagnosis Date  . Alcohol abuse   . Hypertension   . Iron deficiency anemia   . Thrombocytopenia ([place])   . Tobacco abuse   . Uterine fibroid     Patient Active Problem List   Diagnosis Date Noted  . FIBROIDS, UTERUS 11/15/2009  . ANEMIA, IRON DEFICIENCY 11/15/2009  . ALCOHOL ABUSE 11/15/2009  . TOBACCO ABUSE 11/15/2009    Past Surgical History:  Procedure  Laterality Date  . caesarean section     x3  . CESAREAN SECTION     3X     OB History    Gravida  3   Para  3   Term  3   Preterm  0   AB  0   Living  3     SAB  0   IAB  0   Ectopic  0   Multiple  0   Live Births              Family History  Problem Relation Age of Onset  . Fibroids Mother   . Fibroids Sister     Social History   Tobacco Use  . Smoking status: Current Every Day Smoker    Packs/day: 0.25    Types: Cigarettes  . Smokeless tobacco: Never Used  Vaping Use  . Vaping Use: Never used  Substance Use Topics  . Alcohol use: Yes    Comment: daily 24 ounce beer every night  . Drug use: No    Home Medications Prior to Admission medications   Medication Sig Start Date End Date Taking? Authorizing Provider  cyclobenzaprine (FLEXERIL) 10 MG tablet Take 1 tablet (10 mg total) by mouth 2 (two) times daily as needed for up to 7 days for muscle spasms. 05/31/20 06/07/20 Yes Stanislav Gervase, Beverley Fiedler, PA-C  predniSONE (DELTASONE) 20  MG tablet Take 2 tablets (40 mg total) by mouth daily for 5 days. 05/31/20 06/05/20 Yes Semone Orlov, Beverley Fiedler, PA-C  ferrous sulfate 325 (65 FE) MG tablet Take 325 mg by mouth 3 (three) times daily with meals. Reported on 01/23/2015 Patient not taking: Reported on 05/26/2020    [provider]  hydrochlorothiazide (HYDRODIURIL) 25 MG tablet Take 1 tablet (25 mg total) by mouth daily. 05/26/20   Camillia Herter, NP  traMADol Veatrice Bourbon) 50 MG tablet One every 6-8 hours as needed for pain Patient not taking: Reported on 05/26/2020 03/20/19   Antony Blackbird, MD    Allergies    Patient has no known allergies.  Review of Systems   Review of Systems  Constitutional: Negative for fever.  Respiratory: Negative for shortness of breath.   Cardiovascular: Negative for leg swelling.  Gastrointestinal: Negative for abdominal pain.  Musculoskeletal: Positive for back pain.    Physical Exam Updated Vital Signs BP (!) 156/100 (BP Location: Left Arm)    Pulse 87   Temp 98.3 F (36.8 C) (Oral)   Resp 18   LMP 09/17/2010   SpO2 99%   Physical Exam Vitals and nursing note reviewed.  Constitutional:      Appearance: Normal appearance.  HENT:     Head: Normocephalic and atraumatic.     Nose: Nose normal.     Mouth/Throat:     Mouth: Mucous membranes are moist.  Eyes:     Pupils: Pupils are equal, round, and reactive to light.  Cardiovascular:     Rate and Rhythm: Normal rate.  Pulmonary:     Effort: Pulmonary effort is normal.     Breath sounds: No wheezing or rales.  Abdominal:     General: Abdomen is flat.     Tenderness: There is no abdominal tenderness. There is no right CVA tenderness or left CVA tenderness.  Musculoskeletal:        General: Tenderness present.     Cervical back: Normal range of motion and neck supple.     Lumbar back: Spasms and tenderness present. No edema, deformity, signs of trauma, lacerations or bony tenderness. Normal range of motion.     Comments: RLE- KF,KE 5/5 strength LLE- HF, HE 5/5 strength Normal gait. No pronator drift. No leg drop.  CN I, II and VIII not tested. CN II-XII grossly intact bilaterally.     Skin:    General: Skin is warm and dry.  Neurological:     Mental Status: She is alert and oriented to person, place, and time.     ED Results / Procedures / Treatments   Labs (all labs ordered are listed, but only abnormal results are displayed) Labs Reviewed - No data to display  EKG None  Radiology No results found.  Procedures Procedures   Medications Ordered in ED Medications  oxyCODONE-acetaminophen (PERCOCET/ROXICET) 5-325 MG per tablet 1 tablet (1 tablet Oral Given 05/31/20 1700)    ED Course  I have reviewed the triage vital signs and the nursing notes.  Pertinent labs & imaging results that were available during my care of the patient were reviewed by me and considered in my medical decision making (see chart for details).    MDM Rules/Calculators/A&P      Patient presents to the ED with a chief complaint of acute on chronic back pain for the past 2 years.  Patient has had a prior history of L4-L5 stenosis, reports she has tried gabapentin, Flexeril, steroids, hydrocodone without much improvement in  her symptoms.  There is no red flags such as fever, bowel or bladder incontinence, no history of IV drug use.  During evaluation patient is overall well-appearing, antalgic gait, vitals remarkable for hypertension suspect likely due to pain.  Does report taking HCTZ for blood pressure control.  She does have an appointment scheduled with the [facility] health and wellness clinic.  There is pain along the left lumbar spine, radiating down the sciatic nerve distribution.  Does have some numbness to the medial aspect but no perianal component.  MRI Lumbar spine showed: Moderate spinal stenosis L4-5 due to disc and facet degeneration as well as prominent epidural lipomatosis, left greater than right. Epidural fat is compressing the thecal sac, left greater than right  Disc and facet degeneration L5-S1 as well as epidural lipomatosis, left greater than right compressing the distal thecal sac.  Just reform about a year ago, she reports she is receive injections, has taken different medications without relief in her pain.  We discussed treating the acute pain at this time but we will not be able to stop the pain completely.  She will need to ultimately follow-up with spine specialist, I will provide her with a referral for this.  Does have an appointment with the [facility] and wellness on June 2 in order to further evaluate her pain control.  Better with Percocet while in the ED, will go home on a short course of steroid burst along with muscle relaxers. Patient understands and agrees with management. Return precautions discussed at length.   Portions of this note were generated with [employer]. Dictation errors may occur despite best attempts at  proofreading.  Final Clinical Impression(s) / ED Diagnoses Final diagnoses:  Chronic left-sided low back pain with left-sided sciatica    Rx / DC Orders ED Discharge Orders         Ordered    predniSONE (DELTASONE) 20 MG tablet  Daily        05/31/20 1704    cyclobenzaprine (FLEXERIL) 10 MG tablet  2 times daily PRN        05/31/20 1704           Janeece Fitting, PA-C 05/31/20 1718    Lacretia Leigh, MD 06/01/20 1932

## 2020-05-31 NOTE — ED Triage Notes (Signed)
Pt is here with worsening left leg pain.  Pt reports that she has been dx with a pinched nerve causing this pain.  She states that this pain makes it impossible to sleep well and to work.

## 2020-05-31 NOTE — Discharge Instructions (Addendum)
I have prescribed a short course of steroids to help with your symptoms. Please take two tablets daily for the next 5 days.  Please be aware this medication can cause insomnia, appetite changes, flushness.  I have also prescribed a short course of also relaxers, take 1 tablet twice a day for the next 7 days.  Please do not drink alcohol, or drive while taking this medication as taking cause drowsiness.

## 2020-06-03 ENCOUNTER — Other Ambulatory Visit: Payer: Self-pay

## 2020-06-03 NOTE — Telephone Encounter (Signed)
Called patient and LVM advising patient I was calling from [facility] in regards to scheduling an appointment. If patient returns call please schedule her ASAP or offer for patient to be seen at our mobile medicine clinic for walk-in visit for sooner attention. Mobile medicine unit calendar can be found at [email or web address] or location can be given by placing call to office for preferred day.

## 2020-06-04 ENCOUNTER — Other Ambulatory Visit: Payer: Self-pay

## 2020-06-07 ENCOUNTER — Other Ambulatory Visit: Payer: Self-pay | Admitting: Family

## 2020-06-07 DIAGNOSIS — I10 Essential (primary) hypertension: Secondary | ICD-10-CM

## 2020-06-16 ENCOUNTER — Ambulatory Visit: Payer: Self-pay | Admitting: *Deleted

## 2020-06-16 NOTE — Telephone Encounter (Signed)
Copied from [place] [id number]. Topic: General - Other >> Jun 16, 2020  9:07 AM Leward Quan A wrote: Reason for CRM: Patient called in asking to speak to a nurse say that she is having major issues with pain in her legs say she went to the ER a few weeks ago had meds to help her sleep but ran out since and now she hurt all the time. Would like a call back please at Ph# [phone]

## 2020-06-16 NOTE — Telephone Encounter (Signed)
Patient called and she says she's been having leg pain x 2 years, but has gotten worse over the past few weeks. She says she went to the ED and is still in pain. She asking for a note to take to her job to take her out of work. I advised she will need an appointment, she says she has one on 07/08/20 with Freeman Caldron, but would like to be seen sooner She says the pain is a 10, no other symptoms except headache which she says is due to her BP being high from pain as told to her by the ED physician. Appointment scheduled for tomorrow at [phone] with Freeman Caldron, PA-C, care advice given, patient verbalized understanding.   Reason for Disposition . [1] MODERATE pain (e.g., interferes with normal activities, limping) AND [2] present > 3 days  Answer Assessment - Initial Assessment Questions 1. ONSET: "When did the pain start?"      Ongoing for 2 years, gotten worse over the past few weeks 2. LOCATION: "Where is the pain located?"      Left 3. PAIN: "How bad is the pain?"    (Scale 1-10; or mild, moderate, severe)   -  MILD (1-3): doesn't interfere with normal activities    -  MODERATE (4-7): interferes with normal activities (e.g., work or school) or awakens from sleep, limping    -  SEVERE (8-10): excruciating pain, unable to do any normal activities, unable to walk     10 4. WORK OR EXERCISE: "Has there been any recent work or exercise that involved this part of the body?"       I'm not sure, my job requires lifting 5. CAUSE: "What do you think is causing the leg pain?"      Pinched nerve when had MRI 6. OTHER SYMPTOMS: "Do you have any other symptoms?" (e.g., chest pain, back pain, breathing difficulty, swelling, rash, fever, numbness, weakness)     Headaches due to high BP from pain, unable to sleep 7. PREGNANCY: "Is there any chance you are pregnant?" "When was your last menstrual period?"     No  Protocols used: LEG PAIN-A-AH

## 2020-06-17 ENCOUNTER — Ambulatory Visit: Payer: 59 | Attending: Physician Assistant | Admitting: Physician Assistant

## 2020-06-17 ENCOUNTER — Encounter: Payer: Self-pay | Admitting: Physician Assistant

## 2020-06-17 ENCOUNTER — Other Ambulatory Visit: Payer: Self-pay

## 2020-06-17 VITALS — BP 123/79 | HR 101 | Resp 20 | Ht 67.0 in | Wt 168.0 lb

## 2020-06-17 DIAGNOSIS — Z131 Encounter for screening for diabetes mellitus: Secondary | ICD-10-CM

## 2020-06-17 DIAGNOSIS — Z13220 Encounter for screening for lipoid disorders: Secondary | ICD-10-CM

## 2020-06-17 DIAGNOSIS — Z09 Encounter for follow-up examination after completed treatment for conditions other than malignant neoplasm: Secondary | ICD-10-CM

## 2020-06-17 DIAGNOSIS — M5416 Radiculopathy, lumbar region: Secondary | ICD-10-CM | POA: Diagnosis not present

## 2020-06-17 DIAGNOSIS — I10 Essential (primary) hypertension: Secondary | ICD-10-CM | POA: Diagnosis not present

## 2020-06-17 MED ORDER — METHOCARBAMOL 500 MG PO TABS: 1000.0000 mg | ORAL_TABLET | Freq: Four times a day (QID) | ORAL | 0 refills | Status: DC

## 2020-06-17 MED ORDER — TRAMADOL HCL 50 MG PO TABS: ORAL_TABLET | ORAL | 0 refills | Status: DC

## 2020-06-17 MED ORDER — HYDROCHLOROTHIAZIDE 25 MG PO TABS
25.0000 mg | ORAL_TABLET | Freq: Every day | ORAL | 0 refills | Status: DC
Start: 2020-06-17 — End: 2020-09-17

## 2020-06-17 NOTE — Progress Notes (Signed)
Patient ID: Evelyn Crawford, female   DOB: 1960-06-13, 60 y.o.   MRN: [id number]     Evelyn Crawford, is a 60 y.o. female  ZSW:[id number]  [id number]:[id number]  DOB - 1960-10-20  Subjective:  Chief Complaint and HPI: Evelyn Crawford is a 60 y.o. female here today tobe assigned a new PCP and for a follow up visit After ED visit 05/31/2020.  She has htn and is on HCTZ and has lumbar radiculopathy that affects the L leg.  She is currently having a flare of pain and has to work on concrete floors at work.  She needs to f/up with spine specialist.  Also overdue for labs; none since 2020.    From A/P: Patient presents to the ED with a chief complaint of acute on chronic back pain for the past 2 years.  Patient has had a prior history of L4-L5 stenosis, reports she has tried gabapentin, Flexeril, steroids, hydrocodone without much improvement in her symptoms.  There is no red flags such as fever, bowel or bladder incontinence, no history of IV drug use.  During evaluation patient is overall well-appearing, antalgic gait, vitals remarkable for hypertension suspect likely due to pain.  Does report taking HCTZ for blood pressure control.  She does have an appointment scheduled with the [facility] health and wellness clinic.  There is pain along the left lumbar spine, radiating down the sciatic nerve distribution.  Does have some numbness to the medial aspect but no perianal component.  MRI Lumbar spine showed: Moderate spinal stenosis L4-5 due to disc and facet degeneration as well as prominent epidural lipomatosis, left greater than right. Epidural fat is compressing the thecal sac, left greater than right  Disc and facet degeneration L5-S1 as well as epidural lipomatosis, left greater than right compressing the distal thecal sac.  Just reform about a year ago, she reports she is receive injections, has taken different medications without relief in her pain.  We discussed treating the acute pain at this time but  we will not be able to stop the pain completely.  She will need to ultimately follow-up with spine specialist, I will provide her with a referral for this.  Does have an appointment with the [facility] and wellness on June 2 in order to further evaluate her pain control.  Better with Percocet while in the ED, will go home on a short course of steroid burst along with muscle relaxers. Patient understands and agrees with management. Return precautions discussed at length.   ED/Hospital notes reviewed.   Social History: Family history:  ROS:   Constitutional:  No f/c, No night sweats, No unexplained weight loss. EENT:  No vision changes, No blurry vision, No hearing changes. No mouth, throat, or ear problems.  Respiratory: No cough, No SOB Cardiac: No CP, no palpitations GI:  No abd pain, No N/V/D. GU: No Urinary s/sx Musculoskeletal: lbp and L leg pain Neuro: No headache, no dizziness, no motor weakness.  Skin: No rash Endocrine:  No polydipsia. No polyuria.  Psych: Denies SI/HI  No problems updated.  ALLERGIES: No Known Allergies  PAST MEDICAL HISTORY: Past Medical History:  Diagnosis Date  . Alcohol abuse   . Hypertension   . Iron deficiency anemia   . Thrombocytopenia ([place])   . Tobacco abuse   . Uterine fibroid     MEDICATIONS AT HOME: Prior to Admission medications   Medication Sig Start Date End Date Taking? Authorizing Provider  methocarbamol (ROBAXIN) 500 MG tablet Take 2 tablets (  1,000 mg total) by mouth 4 (four) times daily. 06/17/20  Yes Argentina Donovan, PA-C  ferrous sulfate 325 (65 FE) MG tablet Take 325 mg by mouth 3 (three) times daily with meals. Reported on 01/23/2015 Patient not taking: Reported on 05/26/2020    [provider]  hydrochlorothiazide (HYDRODIURIL) 25 MG tablet Take 1 tablet (25 mg total) by mouth daily. 06/17/20   Argentina Donovan, PA-C  traMADol (ULTRAM) 50 MG tablet One tab every 12 hours as needed (use sparingly) 06/17/20   Dametrius Sanjuan,  Dionne Bucy, PA-C     Objective:  EXAM:   Vitals:   06/17/20 0952  BP: 123/79  Pulse: (!) 101  Resp: 20  SpO2: 98%  Weight: 168 lb (76.2 kg)  Height: 5\' 7"  (1.702 m)    General appearance : A&OX3. NAD. Non-toxic-appearing;  Normal gait and ambulation HEENT: Atraumatic and Normocephalic.  PERRLA. EOM intact.  TM clear B. Mouth-MMM, post pharynx WNL w/o erythema, No PND. Neck: supple, no JVD. No cervical lymphadenopathy. No thyromegaly Chest/Lungs:  Breathing-non-labored, Good air entry bilaterally, breath sounds normal without rales, rhonchi, or wheezing  CVS: S1 S2 regular, no murmurs, gallops, rubs  Extremities: Bilateral Lower Ext shows no edema, both legs are warm to touch with = pulse throughout Neurology:  CN II-XII grossly intact, Non focal.   Psych:  TP linear. J/I WNL. Normal speech. Appropriate eye contact and affect.  Skin:  No Rash  Data Review No results found for: HGBA1C   Assessment & Plan   1. Lumbar radiculopathy [facility] note until monday - Ambulatory referral to Spine Surgery - methocarbamol (ROBAXIN) 500 MG tablet; Take 2 tablets (1,000 mg total) by mouth 4 (four) times daily.  Dispense: 90 tablet; Refill: 0 - traMADol (ULTRAM) 50 MG tablet; One tab every 12 hours as needed (use sparingly)  Dispense: 30 tablet; Refill: 0 - Comprehensive metabolic panel - CBC with Differential/Platelet  2. Screening for diabetes mellitus I have had a lengthy discussion and provided education about insulin resistance and the intake of too much sugar/refined carbohydrates.  I have advised the patient to work at a goal of eliminating sugary drinks, candy, desserts, sweets, refined sugars, processed foods, and white carbohydrates.  The patient expresses understanding.  - Hemoglobin A1c  3. Screening cholesterol level - Comprehensive metabolic panel - Lipid panel  4. Essential hypertension Controlled;  Continue  HCTZ - Comprehensive metabolic panel - hydrochlorothiazide  (HYDRODIURIL) 25 MG tablet; Take 1 tablet (25 mg total) by mouth daily.  Dispense: 90 tablet; Refill: 0 - CBC with Differential/Platelet  5. Encounter for examination following treatment at hospital     Patient have been counseled extensively about nutrition and exercise  Return in about 3 months (around 09/17/2020) for assign new PCP(previously [facility]).  The patient was given clear instructions to go to ER or return to medical center if symptoms don't improve, worsen or new problems develop. The patient verbalized understanding. The patient was told to call to get lab results if they haven't heard anything in the next week.     Freeman Caldron, PA-C [facility] [place], [place]   06/17/2020, 10:16 AM

## 2020-06-18 LAB — LIPID PANEL
Chol/HDL Ratio: 4.9 ratio — ABNORMAL HIGH (ref 0.0–4.4)
Cholesterol, Total: 197 mg/dL (ref 100–199)
HDL: 40 mg/dL (ref >39)
LDL Chol Calc (NIH): 92 mg/dL (ref 0–99)
Triglycerides: 393 mg/dL — ABNORMAL HIGH (ref 0–149)
VLDL Cholesterol Cal: 65 mg/dL — ABNORMAL HIGH (ref 5–40)

## 2020-06-18 LAB — CBC WITH DIFFERENTIAL/PLATELET
Basophils Absolute: 0 10*3/uL (ref 0.0–0.2)
Basos: 1 %
EOS (ABSOLUTE): 0.4 10*3/uL (ref 0.0–0.4)
Eos: 4 %
Hematocrit: 40.3 % (ref 34.0–46.6)
Hemoglobin: 14 g/dL (ref 11.1–15.9)
Immature Grans (Abs): 0 10*3/uL (ref 0.0–0.1)
Immature Granulocytes: 1 %
Lymphocytes Absolute: 2.8 10*3/uL (ref 0.7–3.1)
Lymphs: 31 %
MCH: 32.1 pg (ref 26.6–33.0)
MCHC: 34.7 g/dL (ref 31.5–35.7)
MCV: 92 fL (ref 79–97)
Monocytes Absolute: 0.5 10*3/uL (ref 0.1–0.9)
Monocytes: 6 %
Neutrophils Absolute: 5.2 10*3/uL (ref 1.4–7.0)
Neutrophils: 57 %
Platelets: 161 10*3/uL (ref 150–450)
RBC: 4.36 x10E6/uL (ref 3.77–5.28)
RDW: 12.3 % (ref 11.7–15.4)
WBC: 8.9 10*3/uL (ref 3.4–10.8)

## 2020-06-18 LAB — COMPREHENSIVE METABOLIC PANEL
ALT: 18 IU/L (ref 0–32)
AST: 23 IU/L (ref 0–40)
Albumin/Globulin Ratio: 1.5 (ref 1.2–2.2)
Albumin: 4.4 g/dL (ref 3.8–4.9)
Alkaline Phosphatase: 88 IU/L (ref 44–121)
BUN/Creatinine Ratio: 16 (ref 9–23)
BUN: 9 mg/dL (ref 6–24)
Bilirubin Total: 0.2 mg/dL (ref 0.0–1.2)
CO2: 25 mmol/L (ref 20–29)
Calcium: 10.7 mg/dL — ABNORMAL HIGH (ref 8.7–10.2)
Chloride: 99 mmol/L (ref 96–106)
Creatinine, Ser: 0.58 mg/dL (ref 0.57–1.00)
Globulin, Total: 3 g/dL (ref 1.5–4.5)
Glucose: 108 mg/dL — ABNORMAL HIGH (ref 65–99)
Potassium: 3.5 mmol/L (ref 3.5–5.2)
Sodium: 142 mmol/L (ref 134–144)
Total Protein: 7.4 g/dL (ref 6.0–8.5)
eGFR: 105 mL/min/{1.73_m2} (ref >59)

## 2020-06-18 LAB — HEMOGLOBIN A1C
Est. average glucose Bld gHb Est-mCnc: 111 mg/dL
Hgb A1c MFr Bld: 5.5 % (ref 4.8–5.6)

## 2020-06-22 ENCOUNTER — Other Ambulatory Visit: Payer: Self-pay | Admitting: Physician Assistant

## 2020-06-22 MED ORDER — FISH OIL 1000 MG PO CAPS: 3000.0000 mg | ORAL_CAPSULE | Freq: Every day | ORAL | 3 refills | Status: DC

## 2020-06-24 ENCOUNTER — Telehealth: Payer: Self-pay

## 2020-06-24 NOTE — Telephone Encounter (Signed)
Pt. Given results and instructions. Verbalizes understanding. 

## 2020-07-08 ENCOUNTER — Ambulatory Visit: Payer: 59 | Admitting: Physician Assistant

## 2020-08-10 DIAGNOSIS — M5416 Radiculopathy, lumbar region: Secondary | ICD-10-CM | POA: Insufficient documentation

## 2020-09-15 ENCOUNTER — Telehealth: Payer: Self-pay

## 2020-09-15 NOTE — Telephone Encounter (Signed)
Copied from [place] [id number]. Topic: General - Other >> Sep 13, 2020 12:55 PM Pawlus, Brayton Layman A wrote: Reason for CRM: Pt called in asking if she is due for a colon cancer screening /cologuard , please advise.  Patient was a Dr.Fulp patient. Last appt was with Levada Dy 06-17-20

## 2020-09-16 ENCOUNTER — Other Ambulatory Visit: Payer: Self-pay | Admitting: Physician Assistant

## 2020-09-16 DIAGNOSIS — I10 Essential (primary) hypertension: Secondary | ICD-10-CM

## 2020-09-16 NOTE — Telephone Encounter (Signed)
Scheduled patient an appt. To establish care.

## 2020-09-16 NOTE — Telephone Encounter (Signed)
Requested medications are due for refill today yes  Requested medications are on the active medication list yes  Last refill 5/12  Last visit 06/2020  Future visit scheduled 10/2020  Notes to clinic Pt does not have a PCP listed, does have upcoming appt with Dr Wynetta Emery.

## 2020-09-18 ENCOUNTER — Other Ambulatory Visit: Payer: Self-pay | Admitting: Physician Assistant

## 2020-09-18 NOTE — Telephone Encounter (Signed)
Requested medication (s) are due for refill today: no  Requested medication (s) are on the active medication list: yes  Last refill:  06/22/20 #90 3 RF  Future visit scheduled: yes  Notes to clinic:  med not assigned to a protocol   Requested Prescriptions  Pending Prescriptions Disp Refills   Omega-3 Fatty Acids ([place]) 1000 MG CAPS [Pharmacy Med Name: FISH OIL 1,000 MG CAPSULE] 270 capsule 1    Sig: Take 3 capsules (3,000 mg total) by mouth daily.     Off-Protocol Failed - 09/18/2020  2:53 PM      Failed - Medication not assigned to a protocol, review manually.      Passed - Valid encounter within last 12 months    Recent Outpatient Visits           3 months ago Lumbar radiculopathy   [place], [place], [place]   9 months ago Left foot pain   [place], [place], NP   10 months ago Lumbar radiculopathy   [place] Charlott Rakes, MD   1 year ago Lumbar radiculopathy   [place], [place], [place]   1 year ago Papanicolaou smear for cervical cancer screening   [place] Antony Blackbird, MD       Future Appointments             In 1 month [place] Pier, MD [place]           Endocrinology:  Nutritional Agents Passed - 09/18/2020  2:53 PM      Passed - Valid encounter within last 12 months    Recent Outpatient Visits           3 months ago Lumbar radiculopathy   [place], Levada Dy M, [place]   9 months ago Left foot pain   [place], [place], NP   10 months ago Lumbar radiculopathy   [place] Charlott Rakes, MD   1 year ago Lumbar radiculopathy   [place], [place], [place]   1 year ago Papanicolaou  smear for cervical cancer screening   [place] Antony Blackbird, MD       Future Appointments             In 1 month Wynetta Emery Dalbert Batman, MD [place]:  OTC Passed - 09/18/2020  2:53 PM      Passed - Valid encounter within last 12 months    Recent Outpatient Visits           3 months ago Lumbar radiculopathy   [place], Levada Dy M, [place]   9 months ago Left foot pain   [place], [place], NP   10 months ago Lumbar radiculopathy   [place] Charlott Rakes, MD   1 year ago Lumbar radiculopathy   [place], [place]   1 year ago Papanicolaou smear for cervical cancer screening   [place] Antony Blackbird, MD  Future Appointments             In 1 month Wynetta Emery, Dalbert Batman, MD [place]

## 2020-10-02 ENCOUNTER — Other Ambulatory Visit: Payer: Self-pay | Admitting: Family Medicine

## 2020-10-02 DIAGNOSIS — I10 Essential (primary) hypertension: Secondary | ICD-10-CM

## 2020-10-02 NOTE — Telephone Encounter (Signed)
last RF 09/17/20 #30

## 2020-10-28 ENCOUNTER — Ambulatory Visit: Payer: 59 | Admitting: Internal Medicine

## 2020-11-24 ENCOUNTER — Ambulatory Visit: Payer: 59 | Admitting: Physician Assistant

## 2020-12-08 ENCOUNTER — Other Ambulatory Visit: Payer: Self-pay

## 2020-12-08 ENCOUNTER — Ambulatory Visit: Payer: 59 | Attending: Physician Assistant | Admitting: Physician Assistant

## 2020-12-08 ENCOUNTER — Encounter: Payer: Self-pay | Admitting: Physician Assistant

## 2020-12-08 VITALS — BP 130/87 | HR 86 | Temp 98.9°F | Resp 18 | Ht 67.0 in | Wt 168.0 lb

## 2020-12-08 DIAGNOSIS — D696 Thrombocytopenia, unspecified: Secondary | ICD-10-CM | POA: Diagnosis not present

## 2020-12-08 DIAGNOSIS — Z1211 Encounter for screening for malignant neoplasm of colon: Secondary | ICD-10-CM

## 2020-12-08 DIAGNOSIS — Z1231 Encounter for screening mammogram for malignant neoplasm of breast: Secondary | ICD-10-CM

## 2020-12-08 DIAGNOSIS — D229 Melanocytic nevi, unspecified: Secondary | ICD-10-CM

## 2020-12-08 DIAGNOSIS — Z1159 Encounter for screening for other viral diseases: Secondary | ICD-10-CM

## 2020-12-08 DIAGNOSIS — I10 Essential (primary) hypertension: Secondary | ICD-10-CM

## 2020-12-08 DIAGNOSIS — E781 Pure hyperglyceridemia: Secondary | ICD-10-CM

## 2020-12-08 DIAGNOSIS — R233 Spontaneous ecchymoses: Secondary | ICD-10-CM

## 2020-12-08 DIAGNOSIS — M79672 Pain in left foot: Secondary | ICD-10-CM

## 2020-12-08 DIAGNOSIS — D508 Other iron deficiency anemias: Secondary | ICD-10-CM | POA: Diagnosis not present

## 2020-12-08 DIAGNOSIS — E782 Mixed hyperlipidemia: Secondary | ICD-10-CM

## 2020-12-08 MED ORDER — HYDROCHLOROTHIAZIDE 25 MG PO TABS: 25.0000 mg | ORAL_TABLET | Freq: Every day | ORAL | 1 refills | Status: DC

## 2020-12-08 MED ORDER — GABAPENTIN 100 MG PO CAPS: 100.0000 mg | ORAL_CAPSULE | Freq: Every day | ORAL | 1 refills | Status: DC

## 2020-12-08 NOTE — Patient Instructions (Addendum)
Please return for fasting labs, we will call you with those results when they are available.  To help with your foot pain, you will start taking gabapentin at bedtime.  I encourage you to keep the mole on your face clean and dry, use Neosporin, keep it covered as able.   Kennieth Rad, PA-C Physician Assistant [facility] [email or web address]    Health Maintenance, Female Adopting a healthy lifestyle and getting preventive care are important in promoting health and wellness. Ask your health care provider about: The right schedule for you to have regular tests and exams. Things you can do on your own to prevent diseases and keep yourself healthy. What should I know about diet, weight, and exercise? Eat a healthy diet  Eat a diet that includes plenty of vegetables, fruits, low-fat dairy products, and lean protein. Do not eat a lot of foods that are high in solid fats, added sugars, or sodium. Maintain a healthy weight Body mass index (BMI) is used to identify weight problems. It estimates body fat based on height and weight. Your health care provider can help determine your BMI and help you achieve or maintain a healthy weight. Get regular exercise Get regular exercise. This is one of the most important things you can do for your health. Most adults should: Exercise for at least 150 minutes each week. The exercise should increase your heart rate and make you sweat (moderate-intensity exercise). Do strengthening exercises at least twice a week. This is in addition to the moderate-intensity exercise. Spend less time sitting. Even light physical activity can be beneficial. Watch cholesterol and blood lipids Have your blood tested for lipids and cholesterol at 59 years of age, then have this test every 5 years. Have your cholesterol levels checked more often if: Your lipid or cholesterol levels are high. You are older than 59 years of  age. You are at high risk for heart disease. What should I know about cancer screening? Depending on your health history and family history, you may need to have cancer screening at various ages. This may include screening for: Breast cancer. Cervical cancer. Colorectal cancer. Skin cancer. Lung cancer. What should I know about heart disease, diabetes, and high blood pressure? Blood pressure and heart disease High blood pressure causes heart disease and increases the risk of stroke. This is more likely to develop in people who have high blood pressure readings, are of African descent, or are overweight. Have your blood pressure checked: Every 3-5 years if you are 62-36 years of age. Every year if you are 71 years old or older. Diabetes Have regular diabetes screenings. This checks your fasting blood sugar level. Have the screening done: Once every three years after age 75 if you are at a normal weight and have a low risk for diabetes. More often and at a younger age if you are overweight or have a high risk for diabetes. What should I know about preventing infection? Hepatitis B If you have a higher risk for hepatitis B, you should be screened for this virus. Talk with your health care provider to find out if you are at risk for hepatitis B infection. Hepatitis C Testing is recommended for: Everyone born from [birth year] through 1964. Anyone with known risk factors for hepatitis C. Sexually transmitted infections (STIs) Get screened for STIs, including gonorrhea and chlamydia, if: You are sexually active and are younger than 59 years of age. You are older than 59 years of age and your health  care provider tells you that you are at risk for this type of infection. Your sexual activity has changed since you were last screened, and you are at increased risk for chlamydia or gonorrhea. Ask your health care provider if you are at risk. Ask your health care provider about whether you are at high  risk for HIV. Your health care provider may recommend a prescription medicine to help prevent HIV infection. If you choose to take medicine to prevent HIV, you should first get tested for HIV. You should then be tested every 3 months for as long as you are taking the medicine. Pregnancy If you are about to stop having your period (premenopausal) and you may become pregnant, seek counseling before you get pregnant. Take 400 to 800 micrograms (mcg) of folic acid every day if you become pregnant. Ask for birth control (contraception) if you want to prevent pregnancy. Osteoporosis and menopause Osteoporosis is a disease in which the bones lose minerals and strength with aging. This can result in bone fractures. If you are 74 years old or older, or if you are at risk for osteoporosis and fractures, ask your health care provider if you should: Be screened for bone loss. Take a calcium or vitamin D supplement to lower your risk of fractures. Be given hormone replacement therapy (HRT) to treat symptoms of menopause. Follow these instructions at home: Lifestyle Do not use any products that contain nicotine or tobacco, such as cigarettes, e-cigarettes, and chewing tobacco. If you need help quitting, ask your health care provider. Do not use street drugs. Do not share needles. Ask your health care provider for help if you need support or information about quitting drugs. Alcohol use Do not drink alcohol if: Your health care provider tells you not to drink. You are pregnant, may be pregnant, or are planning to become pregnant. If you drink alcohol: Limit how much you use to 0-1 drink a day. Limit intake if you are breastfeeding. Be aware of how much alcohol is in your drink. In the [place] one drink equals one 12 oz bottle of beer (355 mL), one 5 oz glass of wine (148 mL), or one 1 oz glass of hard liquor (44 mL). General instructions Schedule regular health, dental, and eye exams. Stay current with  your vaccines. Tell your health care provider if: You often feel depressed. You have ever been abused or do not feel safe at home. Summary Adopting a healthy lifestyle and getting preventive care are important in promoting health and wellness. Follow your health care provider's instructions about healthy diet, exercising, and getting tested or screened for diseases. Follow your health care provider's instructions on monitoring your cholesterol and blood pressure. This information is not intended to replace advice given to you by your health care provider. Make sure you discuss any questions you have with your health care provider. Document Revised: 04/02/2020 Document Reviewed: 01/16/2018 Elsevier Patient Education  2022 [employer].

## 2020-12-08 NOTE — Progress Notes (Signed)
Patient has taken medication today. Patient has not eaten today. Patient reports chronic pain in the left toe and leg that did not subside or have relief after surgery. Patient states she using  tramadol intermittently due to the medication not aiding in relief from pain. Patient complains of mole on left side of face with pain and white discoloration after "picking" at the site.

## 2020-12-08 NOTE — Progress Notes (Signed)
Established Patient Office Visit  Subjective:  Patient ID: Evelyn Crawford, female    DOB: October 16, 1960  Age: 60 y.o. MRN: [id number]  CC:  Chief Complaint  Patient presents with   Medication Refill   Leg Pain    Left toe radiating  up     HPI Evelyn Crawford presents for medication refills with several complaints.  Reports that she has been checking her blood pressure on occasion while she is at work, states it is usually within normal limits.  Reports that she does not take the fish oil very often, states that she is unclear of why she needed to take this.  Reports that she does not take iron on a daily basis, does not remember diagnosis of iron deficiency anemia.  States that she has many moles on her face, states that she tends to pick at them at times.  States that one on her left cheek did start to peel off a few days ago and started bleeding.  States that it is tender to the touch.  States that she has been keeping it covered with a bandage.  Reports that she had a mass removed from her left great toe at the end of last year.  Reports that she has been having a burning pain radiate from her toe of her leg to her knee.  Reports the pain is worse at night.  Denies numbness, denies swelling.  Has not tried anything for relief.   Past Medical History:  Diagnosis Date   Alcohol abuse    Hypertension    Iron deficiency anemia    Thrombocytopenia (HCC)    Tobacco abuse    Uterine fibroid     Past Surgical History:  Procedure Laterality Date   caesarean section     x3   CESAREAN SECTION     3X    Family History  Problem Relation Age of Onset   Fibroids Mother    Fibroids Sister     Social History   Socioeconomic History   Marital status: Single    Spouse name: Not on file   Number of children: Not on file   Years of education: Not on file   Highest education level: Not on file  Occupational History   Not on file  Tobacco Use   Smoking status: Every Day     Packs/day: 0.25    Types: Cigarettes   Smokeless tobacco: Never  Vaping Use   Vaping Use: Never used  Substance and Sexual Activity   Alcohol use: Yes    Comment: daily 24 ounce beer every night   Drug use: No   Sexual activity: Yes    Birth control/protection: Post-menopausal  Other Topics Concern   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  Lives with grown daughter.  Pt is single, sexually active with men only.          Social Determinants of Health   Financial Resource Strain: Not on file  Food Insecurity: Not on file  Transportation Needs: Not on file  Physical Activity: Not on file  Stress: Not on file  Social Connections: Not on file  Intimate Partner Violence: Not on file    Outpatient Medications Prior  to Visit  Medication Sig Dispense Refill   ferrous sulfate 325 (65 FE) MG tablet Take 325 mg by mouth 3 (three) times daily with meals. Reported on 01/23/2015     Omega-3 Fatty Acids (FISH OIL) 1000 MG CAPS TAKE 3 CAPSULES (3,000 MG TOTAL) BY MOUTH DAILY. 90 capsule 0   traMADol (ULTRAM) 50 MG tablet One tab every 12 hours as needed (use sparingly) 30 tablet 0   hydrochlorothiazide (HYDRODIURIL) 25 MG tablet TAKE 1 TABLET (25 MG TOTAL) BY MOUTH DAILY. 30 tablet 0   methocarbamol (ROBAXIN) 500 MG tablet Take 2 tablets (1,000 mg total) by mouth 4 (four) times daily. (Patient not taking: Reported on 12/08/2020) 90 tablet 0   No facility-administered medications prior to visit.    No Known Allergies  ROS Review of Systems  Constitutional: Negative.   HENT: Negative.    Eyes: Negative.   Respiratory:  Negative for shortness of breath.   Cardiovascular:  Negative for chest pain.  Gastrointestinal: Negative.   Endocrine: Negative.   Genitourinary: Negative.    Musculoskeletal:  Positive for arthralgias.  Skin:  Positive for wound.  Allergic/Immunologic: Negative.   Neurological: Negative.   Hematological: Negative.   Psychiatric/Behavioral: Negative.       Objective:    Physical Exam Vitals and nursing note reviewed.  Constitutional:      Appearance: Normal appearance.  HENT:     Head: Normocephalic and atraumatic.     Right Ear: External ear normal.     Left Ear: External ear normal.     Nose: Nose normal.     Mouth/Throat:     Mouth: Mucous membranes are moist.     Pharynx: Oropharynx is clear.  Eyes:     Extraocular Movements: Extraocular movements intact.     Conjunctiva/sclera: Conjunctivae normal.     Pupils: Pupils are equal, round, and reactive to light.  Cardiovascular:     Rate and Rhythm: Normal rate and regular rhythm.     Pulses: Normal pulses.     Heart sounds: Normal heart sounds.  Pulmonary:     Effort: Pulmonary effort is normal.     Breath sounds: Normal breath sounds.  Musculoskeletal:        General: Normal range of motion.     Cervical back: Normal range of motion and neck supple.  Skin:    General: Skin is warm and dry.     Comments: See photos   Neurological:     General: No focal deficit present.     Mental Status: She is alert and oriented to person, place, and time.  Psychiatric:        Mood and Affect: Mood normal.        Behavior: Behavior normal.        Thought Content: Thought content normal.        Judgment: Judgment normal.         BP 130/87 (BP Location: Left Arm, Patient Position: Sitting, Cuff Size: Normal)   Pulse 86   Temp 98.9 F (37.2 C) (Oral)   Resp 18   Ht $R'5\' 7"'Ne$  (1.702 m)   Wt 168 lb (76.2 kg)   LMP 09/17/2010   SpO2 98%   BMI 26.31 kg/m  Wt Readings from Last 3 Encounters:  12/08/20 168 lb (76.2 kg)  06/17/20 168 lb (76.2 kg)  05/26/20 161 lb 12.8 oz (73.4 kg)     Health Maintenance Due  Topic Date Due   COVID-19 Vaccine (1) Never done  Pneumococcal  Vaccine 45-42 Years old (1 - PCV) Never done   Hepatitis C Screening  Never done   TETANUS/TDAP  Never done   COLONOSCOPY (Pts 45-55yrs Insurance coverage will need to be confirmed)  Never done   MAMMOGRAM  Never done   Zoster Vaccines- Shingrix (1 of 2) Never done   INFLUENZA VACCINE  Never done    There are no preventive care reminders to display for this patient.  Lab Results  Component Value Date   TSH 1.885 11/02/2010   Lab Results  Component Value Date   WBC 8.9 06/17/2020   HGB 14.0 06/17/2020   HCT 40.3 06/17/2020   MCV 92 06/17/2020   PLT 161 06/17/2020   Lab Results  Component Value Date   NA 142 06/17/2020   K 3.5 06/17/2020   CO2 25 06/17/2020   GLUCOSE 108 (H) 06/17/2020   BUN 9 06/17/2020   CREATININE 0.58 06/17/2020   BILITOT 0.2 06/17/2020   ALKPHOS 88 06/17/2020   AST 23 06/17/2020   ALT 18 06/17/2020   PROT 7.4 06/17/2020   ALBUMIN 4.4 06/17/2020   CALCIUM 10.7 (H) 06/17/2020   ANIONGAP 11 04/22/2018   EGFR 105 06/17/2020   Lab Results  Component Value Date   CHOL 197 06/17/2020   Lab Results  Component Value Date   HDL 40 06/17/2020   Lab Results  Component Value Date   LDLCALC 92 06/17/2020   Lab Results  Component Value Date   TRIG 393 (H) 06/17/2020   Lab Results  Component Value Date   CHOLHDL 4.9 (H) 06/17/2020   Lab Results  Component Value Date   HGBA1C 5.5 06/17/2020      Assessment & Plan:   Problem List Items Addressed This Visit       Other   ANEMIA, IRON DEFICIENCY   Relevant Orders   CBC with Differential/Platelet   Iron, TIBC and Ferritin Panel   Other Visit Diagnoses     Essential hypertension    -  Primary   Relevant Medications   hydrochlorothiazide (HYDRODIURIL) 25 MG tablet   High triglycerides       Relevant Medications   hydrochlorothiazide (HYDRODIURIL) 25 MG tablet   Other Relevant Orders   Lipid panel   Thrombocytopenia (HCC)       Relevant Orders   CBC with Differential/Platelet    Encounter for HCV screening test for low risk patient       Relevant Orders   HCV Ab w Reflex to Quant PCR   Screen for colon cancer       Relevant Orders   Ambulatory referral to Gastroenterology   Encounter for screening mammogram for malignant neoplasm of breast       Relevant Orders   MM DIGITAL SCREENING BILATERAL   Left foot pain       Relevant Medications   gabapentin (NEURONTIN) 100 MG capsule   Other Relevant Orders   Ambulatory referral to Podiatry   Bleeding skin mole       Relevant Orders   Ambulatory referral to Dermatology       Meds ordered this encounter  Medications   hydrochlorothiazide (HYDRODIURIL) 25 MG tablet    Sig: Take 1 tablet (25 mg total) by mouth daily.    Dispense:  90 tablet    Refill:  1    Order Specific Question:   Supervising Provider    Answer:   Asencion Noble E [[id number]]   gabapentin (NEURONTIN) 100 MG capsule  Sig: Take 1 capsule (100 mg total) by mouth at bedtime.    Dispense:  30 capsule    Refill:  1    Order Specific Question:   Supervising Provider    Answer:   WRIGHT, PATRICK E [[id number]]   1. Essential hypertension Continue current regimen.  Patient encouraged to continue checking blood pressure at home, keeping a written log and having available for all office visits.  Red flags given for prompt reevaluation.    Patient to return to clinic for fasting labs.   - hydrochlorothiazide (HYDRODIURIL) 25 MG tablet; Take 1 tablet (25 mg total) by mouth daily.  Dispense: 90 tablet; Refill: 1  2. High triglycerides  - Lipid panel; Future  3. Other iron deficiency anemia  - CBC with Differential/Platelet; Future - Iron, TIBC and Ferritin Panel; Future  4. Thrombocytopenia (HCC)  - CBC with Differential/Platelet; Future  5. Left foot pain Trial gabapentin.  Patient education given on supportive care - Ambulatory referral to Podiatry - gabapentin (NEURONTIN) 100 MG capsule; Take 1 capsule (100 mg total) by mouth at bedtime.   Dispense: 30 capsule; Refill: 1  6. Bleeding skin mole Patient education given on supportive care, wound does not appear to require antibiotics at this time - Ambulatory referral to Dermatology  7. Encounter for HCV screening test for low risk patient  - HCV Ab w Reflex to Quant PCR; Future  8. Screen for colon cancer  - Ambulatory referral to Gastroenterology  9. Encounter for screening mammogram for malignant neoplasm of breast  - MM DIGITAL SCREENING BILATERAL; Future   I have reviewed the patient's medical history (PMH, PSH, Social History, Family History, Medications, and allergies) , and have been updated if relevant. I spent 40 minutes reviewing chart and  face to face time with patient.    Follow-up: Return in about 3 months (around 03/10/2021) for To establish PCP, At [facility].    Loraine Grip Mayers, PA-C

## 2020-12-09 DIAGNOSIS — D229 Melanocytic nevi, unspecified: Secondary | ICD-10-CM | POA: Insufficient documentation

## 2020-12-09 DIAGNOSIS — I10 Essential (primary) hypertension: Secondary | ICD-10-CM | POA: Insufficient documentation

## 2020-12-09 DIAGNOSIS — E781 Pure hyperglyceridemia: Secondary | ICD-10-CM | POA: Insufficient documentation

## 2020-12-14 ENCOUNTER — Other Ambulatory Visit: Payer: Self-pay

## 2020-12-14 ENCOUNTER — Ambulatory Visit: Payer: 59 | Attending: Family Medicine

## 2020-12-14 DIAGNOSIS — Z1159 Encounter for screening for other viral diseases: Secondary | ICD-10-CM

## 2020-12-14 DIAGNOSIS — D696 Thrombocytopenia, unspecified: Secondary | ICD-10-CM

## 2020-12-14 DIAGNOSIS — E781 Pure hyperglyceridemia: Secondary | ICD-10-CM

## 2020-12-14 DIAGNOSIS — D508 Other iron deficiency anemias: Secondary | ICD-10-CM

## 2020-12-15 ENCOUNTER — Ambulatory Visit (INDEPENDENT_AMBULATORY_CARE_PROVIDER_SITE_OTHER): Payer: 59

## 2020-12-15 ENCOUNTER — Encounter: Payer: Self-pay | Admitting: Podiatry

## 2020-12-15 ENCOUNTER — Ambulatory Visit (INDEPENDENT_AMBULATORY_CARE_PROVIDER_SITE_OTHER): Payer: 59 | Admitting: Podiatry

## 2020-12-15 DIAGNOSIS — M79672 Pain in left foot: Secondary | ICD-10-CM

## 2020-12-15 DIAGNOSIS — M7989 Other specified soft tissue disorders: Secondary | ICD-10-CM

## 2020-12-15 LAB — CBC WITH DIFFERENTIAL/PLATELET
Basophils Absolute: 0 10*3/uL (ref 0.0–0.2)
Basos: 1 %
EOS (ABSOLUTE): 0.1 10*3/uL (ref 0.0–0.4)
Eos: 1 %
Hematocrit: 40.1 % (ref 34.0–46.6)
Hemoglobin: 13.7 g/dL (ref 11.1–15.9)
Immature Grans (Abs): 0 10*3/uL (ref 0.0–0.1)
Immature Granulocytes: 1 %
Lymphocytes Absolute: 2.9 10*3/uL (ref 0.7–3.1)
Lymphs: 34 %
MCH: 31.2 pg (ref 26.6–33.0)
MCHC: 34.2 g/dL (ref 31.5–35.7)
MCV: 91 fL (ref 79–97)
Monocytes Absolute: 0.6 10*3/uL (ref 0.1–0.9)
Monocytes: 7 %
Neutrophils Absolute: 4.8 10*3/uL (ref 1.4–7.0)
Neutrophils: 56 %
Platelets: 209 10*3/uL (ref 150–450)
RBC: 4.39 x10E6/uL (ref 3.77–5.28)
RDW: 12 % (ref 11.7–15.4)
WBC: 8.4 10*3/uL (ref 3.4–10.8)

## 2020-12-15 LAB — LIPID PANEL
Chol/HDL Ratio: 5.5 ratio — ABNORMAL HIGH (ref 0.0–4.4)
Cholesterol, Total: 187 mg/dL (ref 100–199)
HDL: 34 mg/dL — ABNORMAL LOW (ref >39)
LDL Chol Calc (NIH): 99 mg/dL (ref 0–99)
Triglycerides: 316 mg/dL — ABNORMAL HIGH (ref 0–149)
VLDL Cholesterol Cal: 54 mg/dL — ABNORMAL HIGH (ref 5–40)

## 2020-12-15 LAB — IRON,TIBC AND FERRITIN PANEL
Ferritin: 584 ng/mL — ABNORMAL HIGH (ref 15–150)
Iron Saturation: 37 % (ref 15–55)
Iron: 124 ug/dL (ref 27–159)
Total Iron Binding Capacity: 335 ug/dL (ref 250–450)
UIBC: 211 ug/dL (ref 131–425)

## 2020-12-15 LAB — HCV AB W REFLEX TO QUANT PCR: HCV Ab: 0.1 s/co ratio (ref 0.0–0.9)

## 2020-12-15 LAB — HCV INTERPRETATION

## 2020-12-16 ENCOUNTER — Other Ambulatory Visit: Payer: Self-pay | Admitting: Podiatry

## 2020-12-16 DIAGNOSIS — M7989 Other specified soft tissue disorders: Secondary | ICD-10-CM

## 2020-12-16 MED ORDER — ATORVASTATIN CALCIUM 10 MG PO TABS: 10.0000 mg | ORAL_TABLET | Freq: Every day | ORAL | 3 refills | Status: DC

## 2020-12-16 NOTE — Progress Notes (Signed)
Subjective:   Patient ID: Evelyn Crawford, female   DOB: 60 y.o.   MRN: [id number]   HPI Patient presents stating that she has been getting a lot of on her right forearm and into her lower leg and foot and was concerned and had some to do with her previous surgery we did last year neuro   ROS      Objective:  Physical Exam  Vascular status intact with patient's dorsal incision left first metatarsal healing very well with possibility for structural issues creating her without positive Tinel's sign coming from it burning and discomfort she experiences     Assessment:  Overall doing okay but I am more concerned that this is coming from her back with history of having back surgery in the last few months     Plan:  H&P x-ray reviewed discussed this and do not think the pain she is experiencing is coming from it.  I advised her to begin her gabapentin that was prescribed for her and if this does not get her better she would need to see her neurosurgeon again.  All questions answered  X-rays indicate no signs of bony pathology or arthritis

## 2020-12-16 NOTE — Addendum Note (Signed)
Addended by: Kennieth Rad on: 12/16/2020 03:42 PM   Modules accepted: Orders

## 2020-12-21 ENCOUNTER — Telehealth: Payer: Self-pay

## 2020-12-21 ENCOUNTER — Telehealth: Payer: 59

## 2020-12-21 DIAGNOSIS — E782 Mixed hyperlipidemia: Secondary | ICD-10-CM

## 2020-12-21 NOTE — Telephone Encounter (Signed)
Left message to return call to our office.  

## 2020-12-21 NOTE — Telephone Encounter (Signed)
-----   Message from Carilyn Goodpasture, RN sent at 12/21/2020 10:18 AM EST -----  ----- Message ----- From: Kennieth Rad, PA-C Sent: 12/16/2020   3:42 PM EST To: Trecia Rogers, CMA  Please call patient and let her know that her iron levels are elevated, she does not show signs of anemia.  She does not need to take iron on a daily basis.  Her screening for hepatitis C was negative.  Her cholesterol is elevated, her risk of a cardiovascular event in the next 10 years is 16.4%.  She does need to start cholesterol medication at this time and follow a low-cholesterol diet.  Prescription sent to her pharmacy.  The 10-year ASCVD risk score (Arnett DK, et al., 2019) is: 16.4%   Values used to calculate the score:     Age: 60 years     Sex: Female     Is Non-Hispanic African American: Yes     Diabetic: No     Tobacco smoker: Yes     Systolic Blood Pressure: 290 mmHg     Is BP treated: Yes     HDL Cholesterol: 34 mg/dL     Total Cholesterol: 187 mg/dL

## 2020-12-21 NOTE — Telephone Encounter (Signed)
Pt returned call. States [facility]  charges 118.00 for medication, atorvastatin. States she was advised to use "[facility] [address]. Abbott Pao verbalizes understanding of result message. Please advise regarding refill request. Thank you.

## 2020-12-21 NOTE — Telephone Encounter (Signed)
-----   Message from Carilyn Goodpasture, RN sent at 12/21/2020 10:18 AM EST -----  ----- Message ----- From: Kennieth Rad, PA-C Sent: 12/16/2020   3:42 PM EST To: Trecia Rogers, CMA  Please call patient and let her know that her iron levels are elevated, she does not show signs of anemia.  She does not need to take iron on a daily basis.  Her screening for hepatitis C was negative.  Her cholesterol is elevated, her risk of a cardiovascular event in the next 10 years is 16.4%.  She does need to start cholesterol medication at this time and follow a low-cholesterol diet.  Prescription sent to her pharmacy.  The 10-year ASCVD risk score (Arnett DK, et al., 2019) is: 16.4%   Values used to calculate the score:     Age: 60 years     Sex: Female     Is Non-Hispanic African American: Yes     Diabetic: No     Tobacco smoker: Yes     Systolic Blood Pressure: 778 mmHg     Is BP treated: Yes     HDL Cholesterol: 34 mg/dL     Total Cholesterol: 187 mg/dL

## 2020-12-21 NOTE — Telephone Encounter (Signed)
-----   Message from Carilyn Goodpasture, RN sent at 12/21/2020 10:18 AM EST -----  ----- Message ----- From: Kennieth Rad, PA-C Sent: 12/16/2020   3:42 PM EST To: Trecia Rogers, CMA  Please call patient and let her know that her iron levels are elevated, she does not show signs of anemia.  She does not need to take iron on a daily basis.  Her screening for hepatitis C was negative.  Her cholesterol is elevated, her risk of a cardiovascular event in the next 10 years is 16.4%.  She does need to start cholesterol medication at this time and follow a low-cholesterol diet.  Prescription sent to her pharmacy.  The 10-year ASCVD risk score (Arnett DK, et al., 2019) is: 16.4%   Values used to calculate the score:     Age: 60 years     Sex: Female     Is Non-Hispanic African American: Yes     Diabetic: No     Tobacco smoker: Yes     Systolic Blood Pressure: 979 mmHg     Is BP treated: Yes     HDL Cholesterol: 34 mg/dL     Total Cholesterol: 187 mg/dL

## 2020-12-23 NOTE — Telephone Encounter (Signed)
Pt. Called back about her Lipitor and the cost. The practice left a message for her to call back. Please advise pt.

## 2020-12-23 NOTE — Telephone Encounter (Signed)
Called and left a vm to return call to the office.

## 2020-12-24 ENCOUNTER — Telehealth: Payer: Self-pay

## 2020-12-24 ENCOUNTER — Other Ambulatory Visit: Payer: Self-pay

## 2020-12-24 MED ORDER — ATORVASTATIN CALCIUM 10 MG PO TABS: 10.0000 mg | ORAL_TABLET | Freq: Every day | ORAL | 3 refills | Status: DC

## 2020-12-24 NOTE — Addendum Note (Signed)
Addended by: Daisy Blossom, Miray Mancino L on: 12/24/2020 09:00 AM   Modules accepted: Orders

## 2020-12-24 NOTE — Telephone Encounter (Signed)
Left message to return call to our office.  Will send a letter today to pt.

## 2020-12-24 NOTE — Telephone Encounter (Signed)
Rx sent to requested pharmacy on [address].

## 2020-12-24 NOTE — Telephone Encounter (Signed)
-----   Message from Carilyn Goodpasture, RN sent at 12/21/2020 10:18 AM EST -----  ----- Message ----- From: Kennieth Rad, PA-C Sent: 12/16/2020   3:42 PM EST To: Trecia Rogers, CMA  Please call patient and let her know that her iron levels are elevated, she does not show signs of anemia.  She does not need to take iron on a daily basis.  Her screening for hepatitis C was negative.  Her cholesterol is elevated, her risk of a cardiovascular event in the next 10 years is 16.4%.  She does need to start cholesterol medication at this time and follow a low-cholesterol diet.  Prescription sent to her pharmacy.  The 10-year ASCVD risk score (Arnett DK, et al., 2019) is: 16.4%   Values used to calculate the score:     Age: 60 years     Sex: Female     Is Non-Hispanic African American: Yes     Diabetic: No     Tobacco smoker: Yes     Systolic Blood Pressure: 173 mmHg     Is BP treated: Yes     HDL Cholesterol: 34 mg/dL     Total Cholesterol: 187 mg/dL

## 2021-01-21 ENCOUNTER — Ambulatory Visit: Payer: 59

## 2021-01-24 ENCOUNTER — Other Ambulatory Visit (HOSPITAL_COMMUNITY): Payer: Self-pay

## 2021-02-23 ENCOUNTER — Ambulatory Visit: Payer: Medicaid Other

## 2021-03-14 ENCOUNTER — Ambulatory Visit: Payer: 59 | Admitting: Family Medicine

## 2021-03-16 ENCOUNTER — Encounter: Payer: Self-pay | Admitting: Physician Assistant

## 2021-04-06 ENCOUNTER — Ambulatory Visit: Payer: Self-pay | Attending: Family Medicine | Admitting: Physician Assistant

## 2021-04-06 ENCOUNTER — Encounter: Payer: Self-pay | Admitting: Physician Assistant

## 2021-04-06 ENCOUNTER — Other Ambulatory Visit: Payer: Self-pay

## 2021-04-06 VITALS — BP 133/83 | HR 77 | Wt 168.0 lb

## 2021-04-06 DIAGNOSIS — E782 Mixed hyperlipidemia: Secondary | ICD-10-CM

## 2021-04-06 DIAGNOSIS — E781 Pure hyperglyceridemia: Secondary | ICD-10-CM

## 2021-04-06 DIAGNOSIS — M79672 Pain in left foot: Secondary | ICD-10-CM

## 2021-04-06 DIAGNOSIS — M5416 Radiculopathy, lumbar region: Secondary | ICD-10-CM

## 2021-04-06 DIAGNOSIS — I10 Essential (primary) hypertension: Secondary | ICD-10-CM

## 2021-04-06 MED ORDER — GABAPENTIN 300 MG PO CAPS
300.0000 mg | ORAL_CAPSULE | Freq: Every day | ORAL | 3 refills | Status: DC
  Filled 2021-04-06 – 2021-04-18 (×2): qty 30, 30d supply, fill #0

## 2021-04-06 MED ORDER — HYDROCHLOROTHIAZIDE 25 MG PO TABS
25.0000 mg | ORAL_TABLET | Freq: Every day | ORAL | 1 refills | Status: DC
  Filled 2021-04-06: qty 30, 30d supply, fill #0
  Filled 2021-04-18: qty 90, 90d supply, fill #0
  Filled 2021-10-17: qty 90, 90d supply, fill #1

## 2021-04-06 MED ORDER — ATORVASTATIN CALCIUM 10 MG PO TABS
10.0000 mg | ORAL_TABLET | Freq: Every day | ORAL | 3 refills | Status: DC
  Filled 2021-04-06 – 2021-04-18 (×2): qty 90, 90d supply, fill #0
  Filled 2021-08-22: qty 90, 90d supply, fill #1

## 2021-04-06 NOTE — Progress Notes (Signed)
Patient ID: Evelyn Crawford, female   DOB: 16-Sep-1960, 61 y.o.   MRN: [id number] ? ? ?Demetra Moya, is a 61 y.o. female ? ?[id number] ? ?[id number] ? ?DOB - [birth year] ? ?Chief Complaint  ?Patient presents with  ? Leg Pain  ? Foot Pain  ?    ? ?Subjective:  ? ?Evelyn Crawford is a 61 y.o. female here today for a follow up visit after having back surgery.  She is still having significant pain esp in her LLE even after back surgery.  She has not followed up with neurosurgery regarding this and is not taking anything for it.  S/p L4-L5 decompression 08/2020 ? ?She has eaten 2 hotdogs just PTA.  Hasn't had cholesterol rechecked since starting cholesterol meds.  Tolerating them well w/o SE.   ? ?No problems updated. ? ?ALLERGIES: ?No Known Allergies ? ?PAST MEDICAL HISTORY: ?Past Medical History:  ?Diagnosis Date  ? Alcohol abuse   ? Hypertension   ? Iron deficiency anemia   ? Thrombocytopenia ([place])   ? Tobacco abuse   ? Uterine fibroid   ? ? ?MEDICATIONS AT HOME: ?Prior to Admission medications   ?Medication Sig Start Date End Date Taking? Authorizing Provider  ?gabapentin (NEURONTIN) 300 MG capsule Take 1 capsule (300 mg total) by mouth at bedtime. 04/06/21  Yes Argentina Donovan, PA-C  ?atorvastatin (LIPITOR) 10 MG tablet Take 1 tablet (10 mg total) by mouth daily. 04/06/21   Argentina Donovan, PA-C  ?ferrous sulfate 325 (65 FE) MG tablet Take 325 mg by mouth 3 (three) times daily with meals. Reported on 01/23/2015 ?Patient not taking: Reported on 04/06/2021    [provider]  ?hydrochlorothiazide (HYDRODIURIL) 25 MG tablet Take 1 tablet (25 mg total) by mouth daily. 04/06/21   Argentina Donovan, PA-C  ?methocarbamol (ROBAXIN) 500 MG tablet Take 2 tablets (1,000 mg total) by mouth 4 (four) times daily. ?Patient not taking: Reported on 12/08/2020 06/17/20   Argentina Donovan, PA-C  ?Omega-3 Fatty Acids (FISH OIL) 1000 MG CAPS TAKE 3 CAPSULES (3,000 MG TOTAL) BY MOUTH DAILY. ?Patient not taking: Reported on 04/06/2021  09/20/20   Ladell Pier, MD  ?traMADol Veatrice Bourbon) 50 MG tablet One tab every 12 hours as needed (use sparingly) ?Patient not taking: Reported on 04/06/2021 06/17/20   Argentina Donovan, PA-C  ? ? ?ROS: ?Neg HEENT ?Neg resp ?Neg cardiac ?Neg GI ?Neg GU ?Neg psych ?Neg neuro ? ?Objective:  ? ?Vitals:  ? 04/06/21 1522  ?BP: 133/83  ?Pulse: 77  ?SpO2: 98%  ?Weight: 168 lb (76.2 kg)  ? ?Exam ?General appearance : Awake, alert, not in any distress. Speech Clear. Not toxic looking ?HEENT: Atraumatic and Normocephalic, pupils equally reactive to light and accomodation ?Neck: Supple, no JVD. No cervical lymphadenopathy.  ?Chest: Good air entry bilaterally, CTAB.  No rales/rhonchi/wheezing ?CVS: S1 S2 regular, no murmurs.  ?Extremities: B/L Lower Ext shows no edema, both legs are warm to touch ?Neurology: Awake alert, and oriented X 3, CN II-XII intact, Non focal ?Skin: No Rash ? ?Data Review ?Lab Results  ?Component Value Date  ? HGBA1C 5.5 06/17/2020  ? ? ?Assessment & Plan  ? ?1. Mixed hyperlipidemia ?- atorvastatin (LIPITOR) 10 MG tablet; Take 1 tablet (10 mg total) by mouth daily.  Dispense: 90 tablet; Refill: 3 ? ?2. Essential hypertension ?At goal but could improve with low sodium diet ?- hydrochlorothiazide (HYDRODIURIL) 25 MG tablet; Take 1 tablet (25 mg total) by mouth daily.  Dispense: 90 tablet; Refill:  1 ? ?3. High triglycerides ?See #1 ? ?4. Left foot pain ?- gabapentin (NEURONTIN) 300 MG capsule; Take 1 capsule (300 mg total) by mouth at bedtime.  Dispense: 90 capsule; Refill: 3 ?- Ambulatory referral to Neurosurgery ? ?5. Lumbar radiculopathy ?- gabapentin (NEURONTIN) 300 MG capsule; Take 1 capsule (300 mg total) by mouth at bedtime.  Dispense: 90 capsule; Refill: 3 ?- Ambulatory referral to Neurosurgery-pre suregery pain issues remain-f/up  ? ? ? ?Patient have been counseled extensively about nutrition and exercise. Other issues discussed during this visit include: low cholesterol diet, weight control and  daily exercise, foot care, annual eye examinations at Ophthalmology, importance of adherence with medications and regular follow-up. We also discussed long term complications of uncontrolled diabetes and hypertension.  ? ?Return in about 3 months (around 07/07/2021) for assign PCP here/chronic conditions. ? ?The patient was given clear instructions to go to ER or return to medical center if symptoms don't improve, worsen or new problems develop. The patient verbalized understanding. The patient was told to call to get lab results if they haven't heard anything in the next week.  ? ? ? ? ?Freeman Caldron, PA-C ?[place] ?[place], [place] ?[phone]   ?04/06/2021, 3:59 PM  ?

## 2021-04-13 ENCOUNTER — Other Ambulatory Visit: Payer: Self-pay

## 2021-04-18 ENCOUNTER — Other Ambulatory Visit: Payer: Self-pay

## 2021-04-21 ENCOUNTER — Other Ambulatory Visit: Payer: Self-pay

## 2021-08-22 ENCOUNTER — Other Ambulatory Visit: Payer: Self-pay

## 2021-08-26 ENCOUNTER — Other Ambulatory Visit: Payer: Self-pay | Admitting: Pharmacist

## 2021-08-26 ENCOUNTER — Other Ambulatory Visit: Payer: Self-pay

## 2021-08-26 NOTE — Chronic Care Management (AMB) (Signed)
Patient seen by Berdie Ogren, PharmD Candidate on 08/26/2021 while they were picking up prescriptions at [place] [facility].   Blood pressure today was : 154/104, HR 77; on recheck (if >140 or >90): 142/99, HR 77   Patient does not have an automated home blood pressure machine. Discussed with patient methods for obtaining one.   Medication review was performed. They are taking medications as prescribed.   The following barriers to adherence were noted:  - Denies concerns with medication access or understanding.   The following interventions were completed:  - Medications were reviewed  - Patient was educated on medications, including indication and administration  - Patient was educated on how to access home blood pressure machine  - Patient was counseled on lifestyle modifications to improve blood pressure. Patient reported being stressed this morning while running errands with her daughter. Discussed stress reduction techniques.   The patient does not have follow up scheduled. Last visit was 04/06/2021 with Freeman Caldron, PA. Will collaborate with clinical staff to have patient outreached for follow up.  Catie Hedwig Morton, PharmD, [facility]  [phone]

## 2021-10-17 ENCOUNTER — Other Ambulatory Visit: Payer: Self-pay

## 2021-10-19 ENCOUNTER — Other Ambulatory Visit: Payer: Self-pay

## 2021-11-29 IMAGING — MR MR LUMBAR SPINE W/O CM
5 series · 45 of 48 positions shown · non-contrast
Comparison: None.

CLINICAL DATA: Low back pain left leg pain

EXAM:
MRI LUMBAR SPINE WITHOUT CONTRAST
TECHNIQUE: Multiplanar, multisequence MR imaging of the lumbar spine was
performed. No intravenous contrast was administered.

[Series 3: T2 · sagittal · 4.0mm · 0.88mm/px · 6 of 15 slices shown (1 of 2)]
[im 1/15]
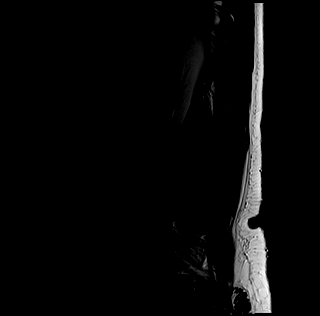
[im 3/15]
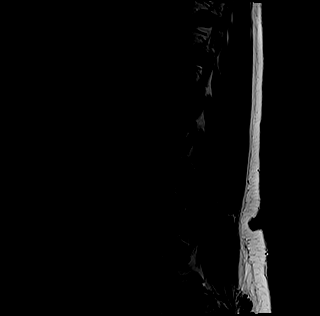
[im 6/15]
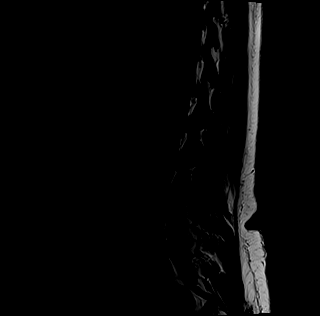
[im 9/15]
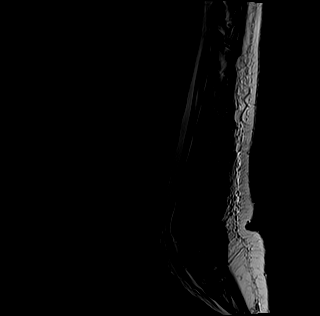
[im 12/15]
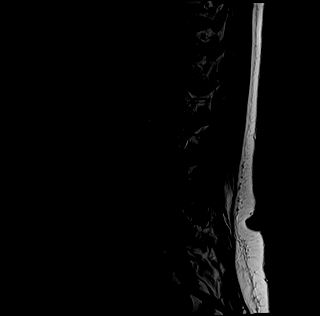
[im 15/15]
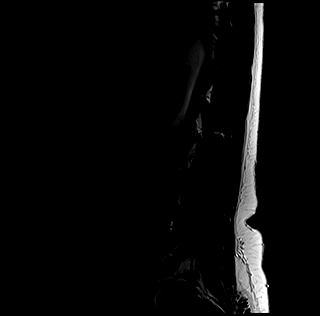

[Series 4: tirm sag · sagittal · 4.0mm · 0.55mm/px · 6 of 15 slices shown]
[im 1/15]
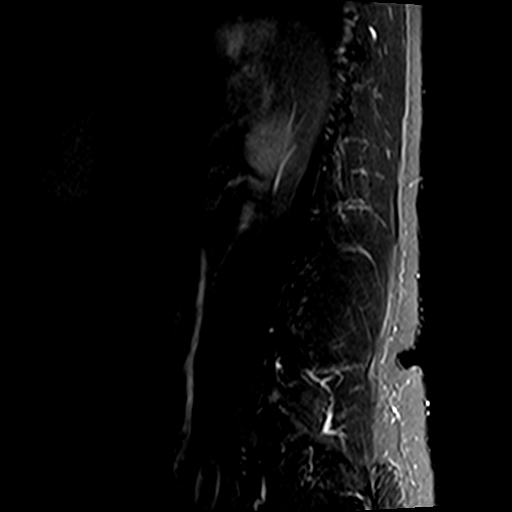
[im 3/15]
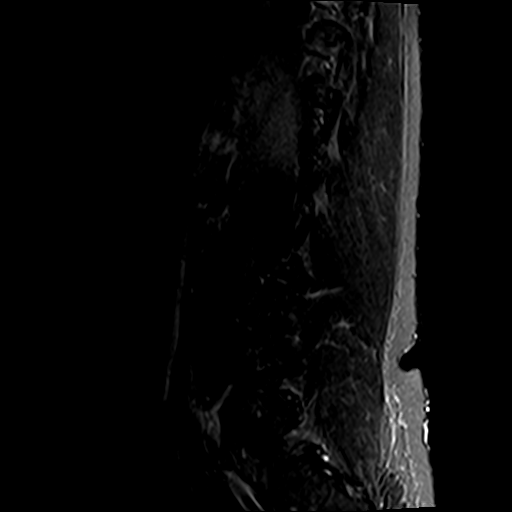
[im 6/15]
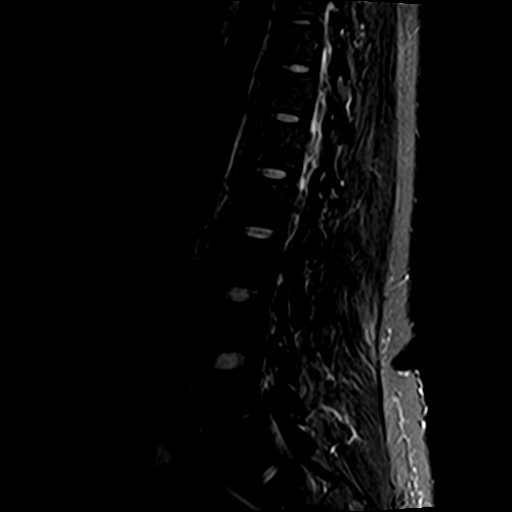
[im 9/15]
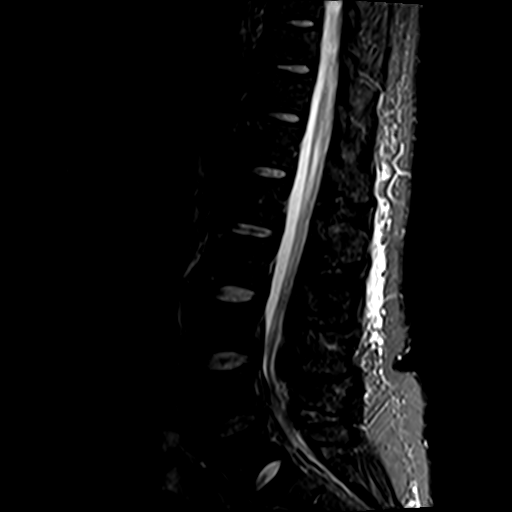
[im 12/15]
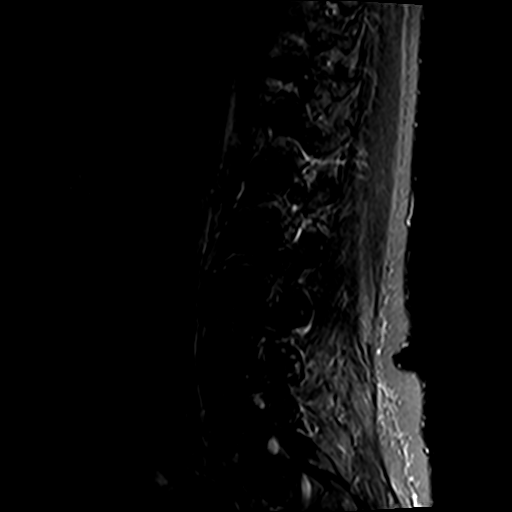
[im 15/15]
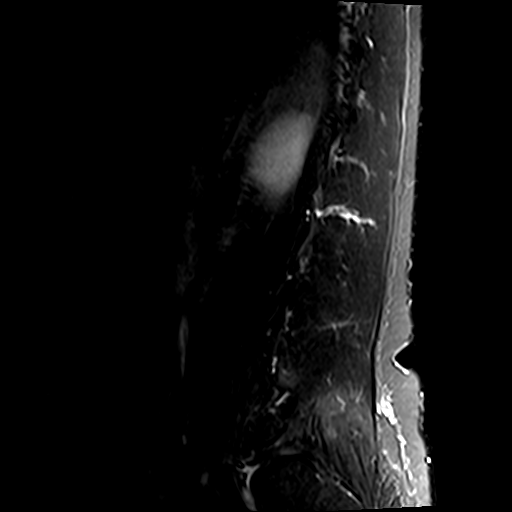

[Series 5: T1 · sagittal · 4.0mm · 0.88mm/px · 6 of 15 slices shown (1 of 2)]
[im 1/15]
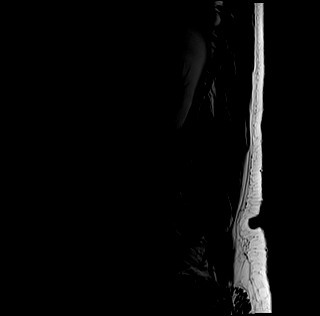
[im 3/15]
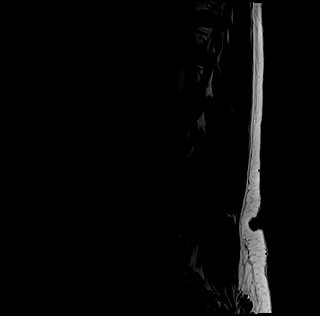
[im 6/15]
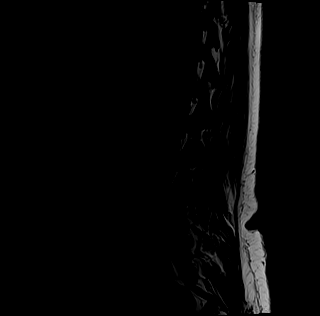
[im 9/15]
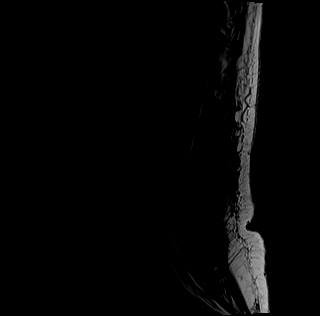
[im 12/15]
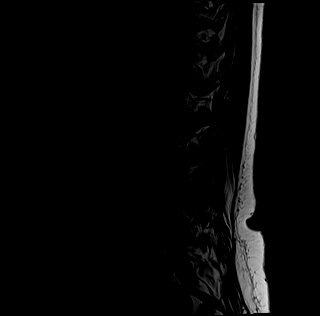
[im 15/15]
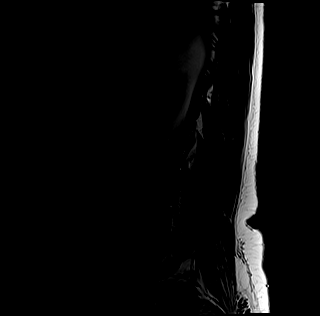

[Series 7: T2 · axial · 4.0mm · 0.70mm/px · z∈[-157,+71]mm · 15 of 38 slices shown (2 of 2)]
[im 1/38]
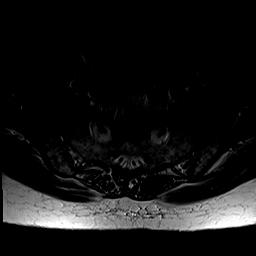
[im 3/38]
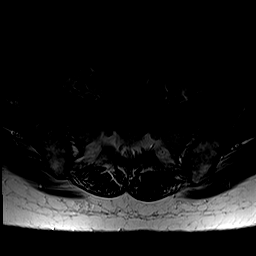
[im 6/38]
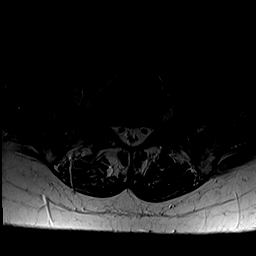
[im 8/38]
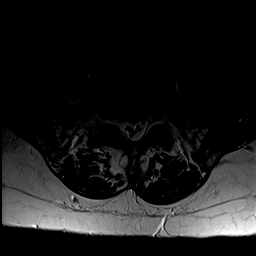
[im 11/38]
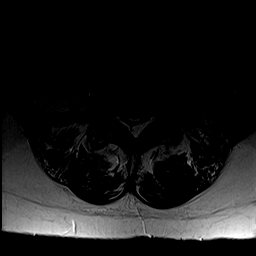
[im 14/38]
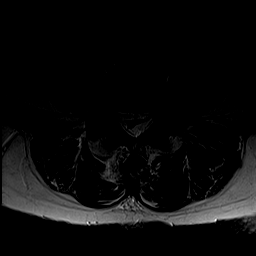
[im 16/38]
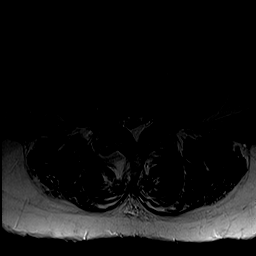
[im 19/38]
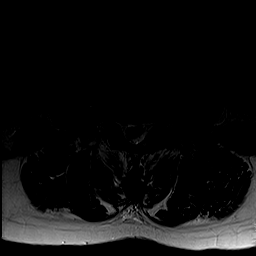
[im 22/38]
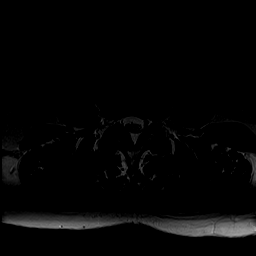
[im 24/38]
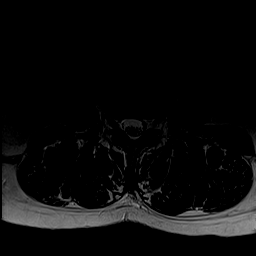
[im 27/38]
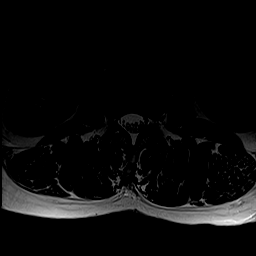
[im 30/38]
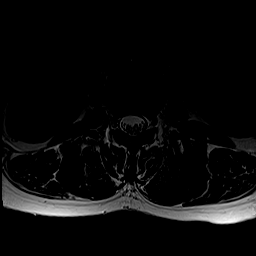
[im 32/38]
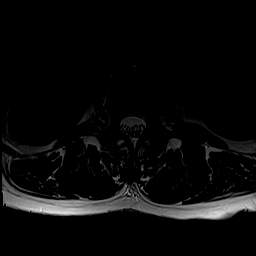
[im 35/38]
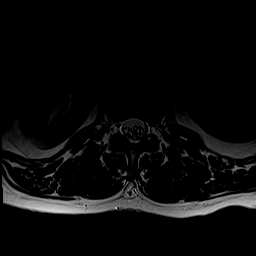
[im 38/38]
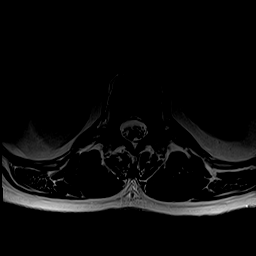

[Series 8: T1 · axial · 4.0mm · 0.70mm/px · z∈[-157,+71]mm · 12 of 38 slices shown (2 of 2)]
[im 1/38]
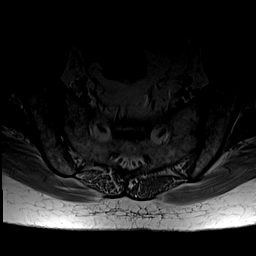
[im 3/38]
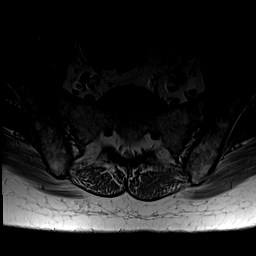
[im 6/38]
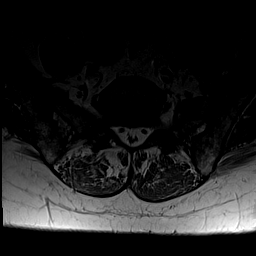
[im 8/38]
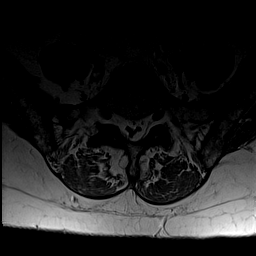
[im 11/38]
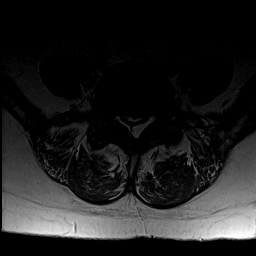
[im 14/38]
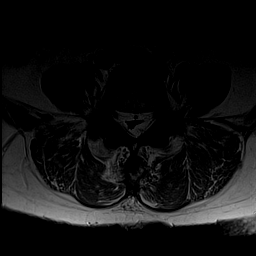
[im 16/38]
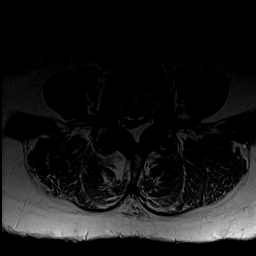
[im 19/38]
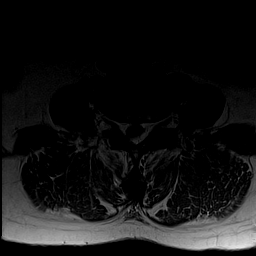
[im 22/38]
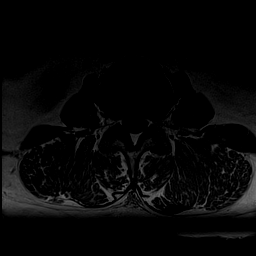
[im 27/38]
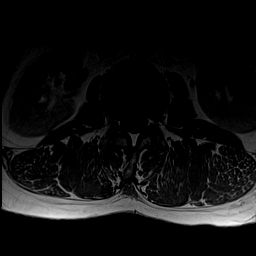
[im 32/38]
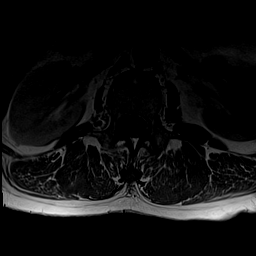
[im 38/38]
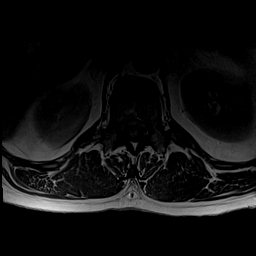

[45 of 48 positions shown; findings below may reference images not displayed]

FINDINGS: Segmentation: Normal segmentation. S1 is lumbarized with a well
developed S1-2 disc space.

Alignment:  Normal

Vertebrae:  Negative for fracture or mass.

Conus medullaris and cauda equina: Conus extends to the L1-2 level.
Conus and cauda equina appear normal.

Paraspinal and other soft tissues: Negative for paraspinous mass or
adenopathy

Disc levels:

L1-2: Negative

L2-3: Negative

L3-4: Mild disc and mild facet degeneration.  Negative for stenosis

L4-5: Mild disc bulge. Moderate facet hypertrophy. Moderate spinal
stenosis primarily due to prominent epidural lipomatosis left
greater than right which is compressing the thecal sac. Subarticular
stenosis bilaterally.

L5-S1: Diffuse bulging of the disc and mild facet degeneration.
Epidural lipomatosis most prominent on the left
IMPRESSION: Moderate spinal stenosis L4-5 due to disc and facet degeneration as
well as prominent epidural lipomatosis, left greater than right.
Epidural fat is compressing the thecal sac, left greater than right

Disc and facet degeneration L5-S1 as well as epidural lipomatosis,
left greater than right compressing the distal thecal sac.

## 2021-12-06 ENCOUNTER — Ambulatory Visit: Payer: Self-pay | Attending: Nurse Practitioner | Admitting: Nurse Practitioner

## 2021-12-06 ENCOUNTER — Encounter: Payer: Self-pay | Admitting: Nurse Practitioner

## 2021-12-06 VITALS — BP 131/87 | HR 77 | Temp 98.0°F | Ht 67.0 in | Wt 163.3 lb

## 2021-12-06 DIAGNOSIS — D696 Thrombocytopenia, unspecified: Secondary | ICD-10-CM

## 2021-12-06 DIAGNOSIS — E782 Mixed hyperlipidemia: Secondary | ICD-10-CM

## 2021-12-06 DIAGNOSIS — Z7689 Persons encountering health services in other specified circumstances: Secondary | ICD-10-CM

## 2021-12-06 DIAGNOSIS — M5416 Radiculopathy, lumbar region: Secondary | ICD-10-CM

## 2021-12-06 DIAGNOSIS — Z1211 Encounter for screening for malignant neoplasm of colon: Secondary | ICD-10-CM

## 2021-12-06 DIAGNOSIS — Z1231 Encounter for screening mammogram for malignant neoplasm of breast: Secondary | ICD-10-CM

## 2021-12-06 DIAGNOSIS — I10 Essential (primary) hypertension: Secondary | ICD-10-CM

## 2021-12-06 NOTE — Progress Notes (Signed)
Assessment & Plan:  Evelyn Crawford was seen today for establish care.  Diagnoses and all orders for this visit:  Encounter to establish care  Colon cancer screening -     Fecal occult blood, imunochemical  Breast cancer screening by mammogram -     MM DIGITAL SCREENING BILATERAL; Future  Essential hypertension -     CMP14+EGFR Continue all antihypertensives as prescribed.  Reminded to bring in blood pressure log for follow  up appointment.  RECOMMENDATIONS: DASH/Mediterranean Diets are healthier choices for HTN.    Mixed hyperlipidemia -     Lipid panel  Thrombocytopenia (HCC) -     CBC with Differential  Lumbar radiculopathy -     Nerve conduction test; Future    Patient has been counseled on age-appropriate routine health concerns for screening and prevention. These are reviewed and up-to-date. Referrals have been placed accordingly. Immunizations are up-to-date or declined.    Subjective:   Chief Complaint  Patient presents with   Establish Care    Resend referral for neurosurgery , couldn't make last appointment.    HPI Evelyn Crawford 61 y.o. female presents to office today to establish care and requesting referral for NCT.   She has a history of ETOH abuse, HTN, IDA, Thrombocytopenia, tobacco abuse, uterine fibroids and lumbar radiculopathy with numbness in left leg and left foot.    HTN Blood pressure is not quite at goal. She needs a refill of HCTZ 25 mg.  BP Readings from Last 3 Encounters:  12/06/21 131/87  08/26/21 (!) 142/99  04/06/21 133/83    Back Pain She was referred to [facility] several months ago. She is requesting a nerve conduction study that she has been lost to follow up for. She states gabapentin, cymbalta and lyrica have been ineffective Per Neurosurgery office note on 04-18-2021: The patient continues to have left lower leg symptoms of painful paresthesias. She did have lumbar laminectomy at L4-5 as well as mass resected  from top of left foot. Her pain seems to originate from the area and travel up through shin. I explained this could be "double crush" injury from both spinal nerve and nerve at site of this mass. I am ordering NCS/EMG for further evaluation. I also explained that it is possible the nerve is damaged and despite being surgically released, will continue to cause painful paraesthesias. She previously had L4-5 decompressive laminectomy for evacuation of epidural lipomatosis on 08/10/2020 with Dr Katherine Roan. Prior to surgery patient reported symptoms in the left lateral calf, across the left ankle and into the top of her left foot, describing a "burning", "tingling" sensation in that region. She had no improvement in these symptoms postoperatively. Given the mass excised previously from the area with these paraesthesias, it was felt ongoing symptoms may be secondary for tarsalgia.   Today in clinic she continues to have left lower leg paraesthesias. She describes burning, tingling, numbing pain in lower anterior/lateral shin and top of foot. No weakness, no right sided issues.      Review of Systems  Constitutional:  Negative for fever, malaise/fatigue and weight loss.  HENT: Negative.  Negative for nosebleeds.   Eyes: Negative.  Negative for blurred vision, double vision and photophobia.  Respiratory: Negative.  Negative for cough and shortness of breath.   Cardiovascular: Negative.  Negative for chest pain, palpitations and leg swelling.  Gastrointestinal: Negative.  Negative for heartburn, nausea and vomiting.  Musculoskeletal: Negative.  Negative for myalgias.  Neurological:  Positive  for tingling and sensory change. Negative for dizziness, focal weakness, seizures and headaches.  Psychiatric/Behavioral: Negative.  Negative for suicidal ideas.     Past Medical History:  Diagnosis Date   Alcohol abuse    Hypertension    Iron deficiency anemia    Thrombocytopenia (HCC)    Tobacco abuse    Uterine  fibroid     Past Surgical History:  Procedure Laterality Date   caesarean section     x3   CESAREAN SECTION     3X    Family History  Problem Relation Age of Onset   Fibroids Mother    Fibroids Sister     Social History Reviewed with no changes to be made today.   Outpatient Medications Prior to Visit  Medication Sig Dispense Refill   atorvastatin (LIPITOR) 10 MG tablet Take 1 tablet (10 mg total) by mouth daily. 90 tablet 3   hydrochlorothiazide (HYDRODIURIL) 25 MG tablet Take 1 tablet (25 mg total) by mouth daily. 90 tablet 1   ferrous sulfate 325 (65 FE) MG tablet Take 325 mg by mouth 3 (three) times daily with meals. Reported on 01/23/2015 (Patient not taking: Reported on 04/06/2021)     gabapentin (NEURONTIN) 300 MG capsule Take 1 capsule (300 mg total) by mouth at bedtime. (Patient not taking: Reported on 12/06/2021) 90 capsule 3   methocarbamol (ROBAXIN) 500 MG tablet Take 2 tablets (1,000 mg total) by mouth 4 (four) times daily. (Patient not taking: Reported on 12/08/2020) 90 tablet 0   Omega-3 Fatty Acids (FISH OIL) 1000 MG CAPS TAKE 3 CAPSULES (3,000 MG TOTAL) BY MOUTH DAILY. (Patient not taking: Reported on 04/06/2021) 90 capsule 0   traMADol (ULTRAM) 50 MG tablet One tab every 12 hours as needed (use sparingly) (Patient not taking: Reported on 04/06/2021) 30 tablet 0   No facility-administered medications prior to visit.    No Known Allergies     Objective:    BP 131/87   Pulse 77   Temp 98 F (36.7 C) (Temporal)   Ht _0  (1.702 m)   Wt 163 lb 4.8 oz (74.1 kg)   LMP 09/17/2010   SpO2 97%   BMI 25.58 kg/m  Wt Readings from Last 3 Encounters:  12/06/21 163 lb 4.8 oz (74.1 kg)  04/06/21 168 lb (76.2 kg)  12/08/20 168 lb (76.2 kg)    Physical Exam Vitals and nursing note reviewed.  Constitutional:      Appearance: She is well-developed.  HENT:     Head: Normocephalic and atraumatic.  Cardiovascular:     Rate and Rhythm: Normal rate and regular rhythm.      Heart sounds: Normal heart sounds. No murmur heard.    No friction rub. No gallop.  Pulmonary:     Effort: Pulmonary effort is normal. No tachypnea or respiratory distress.     Breath sounds: Normal breath sounds. No decreased breath sounds, wheezing, rhonchi or rales.  Chest:     Chest wall: No tenderness.  Abdominal:     General: Bowel sounds are normal.     Palpations: Abdomen is soft.  Musculoskeletal:        General: Normal range of motion.     Cervical back: Normal range of motion.  Skin:    General: Skin is warm and dry.  Neurological:     Mental Status: She is alert and oriented to person, place, and time.     Coordination: Coordination normal.  Psychiatric:  Behavior: Behavior normal. Behavior is cooperative.        Thought Content: Thought content normal.        Judgment: Judgment normal.          Patient has been counseled extensively about nutrition and exercise as well as the importance of adherence with medications and regular follow-up. The patient was given clear instructions to go to ER or return to medical center if symptoms don't improve, worsen or new problems develop. The patient verbalized understanding.   Follow-up: Return in about 6 months (around 06/06/2022).   Gildardo Pounds, FNP-BC [facility] [place], [place]   12/06/2021, 8:41 PM

## 2021-12-07 ENCOUNTER — Other Ambulatory Visit: Payer: Self-pay | Admitting: Nurse Practitioner

## 2021-12-07 DIAGNOSIS — E781 Pure hyperglyceridemia: Secondary | ICD-10-CM

## 2021-12-07 LAB — CBC WITH DIFFERENTIAL/PLATELET
Basophils Absolute: 0 10*3/uL (ref 0.0–0.2)
Basos: 1 %
EOS (ABSOLUTE): 0.1 10*3/uL (ref 0.0–0.4)
Eos: 2 %
Hematocrit: 37.4 % (ref 34.0–46.6)
Hemoglobin: 12.5 g/dL (ref 11.1–15.9)
Immature Grans (Abs): 0 10*3/uL (ref 0.0–0.1)
Immature Granulocytes: 0 %
Lymphocytes Absolute: 2.4 10*3/uL (ref 0.7–3.1)
Lymphs: 29 %
MCH: 30.8 pg (ref 26.6–33.0)
MCHC: 33.4 g/dL (ref 31.5–35.7)
MCV: 92 fL (ref 79–97)
Monocytes Absolute: 0.6 10*3/uL (ref 0.1–0.9)
Monocytes: 7 %
Neutrophils Absolute: 5 10*3/uL (ref 1.4–7.0)
Neutrophils: 61 %
Platelets: 161 10*3/uL (ref 150–450)
RBC: 4.06 x10E6/uL (ref 3.77–5.28)
RDW: 12.3 % (ref 11.7–15.4)
WBC: 8.2 10*3/uL (ref 3.4–10.8)

## 2021-12-07 LAB — CMP14+EGFR
ALT: 15 IU/L (ref 0–32)
AST: 22 IU/L (ref 0–40)
Albumin/Globulin Ratio: 1.6 (ref 1.2–2.2)
Albumin: 4.6 g/dL (ref 3.8–4.9)
Alkaline Phosphatase: 67 IU/L (ref 44–121)
BUN/Creatinine Ratio: 15 (ref 12–28)
BUN: 12 mg/dL (ref 8–27)
Bilirubin Total: 0.4 mg/dL (ref 0.0–1.2)
CO2: 26 mmol/L (ref 20–29)
Calcium: 10.8 mg/dL — ABNORMAL HIGH (ref 8.7–10.3)
Chloride: 101 mmol/L (ref 96–106)
Creatinine, Ser: 0.8 mg/dL (ref 0.57–1.00)
Globulin, Total: 2.9 g/dL (ref 1.5–4.5)
Glucose: 97 mg/dL (ref 70–99)
Potassium: 3.5 mmol/L (ref 3.5–5.2)
Sodium: 142 mmol/L (ref 134–144)
Total Protein: 7.5 g/dL (ref 6.0–8.5)
eGFR: 84 mL/min/{1.73_m2} (ref >59)

## 2021-12-07 LAB — LIPID PANEL
Chol/HDL Ratio: 4.2 ratio (ref 0.0–4.4)
Cholesterol, Total: 181 mg/dL (ref 100–199)
HDL: 43 mg/dL (ref >39)
LDL Chol Calc (NIH): 78 mg/dL (ref 0–99)
Triglycerides: 371 mg/dL — ABNORMAL HIGH (ref 0–149)
VLDL Cholesterol Cal: 60 mg/dL — ABNORMAL HIGH (ref 5–40)

## 2021-12-07 MED ORDER — ATORVASTATIN CALCIUM 20 MG PO TABS: 20.0000 mg | ORAL_TABLET | Freq: Every day | ORAL | 1 refills | Status: DC

## 2022-01-11 ENCOUNTER — Telehealth: Payer: Self-pay | Admitting: Emergency Medicine

## 2022-01-11 ENCOUNTER — Ambulatory Visit: Payer: Self-pay | Admitting: *Deleted

## 2022-01-11 NOTE — Telephone Encounter (Signed)
Copied from [place] [id number]. Topic: General - Other >> Jan 11, 2022 12:13 PM Evelyn Crawford J wrote: Reason for CRM: Pt stated she was suppose to have a nerve conduction test / but she hasn't heard anything more about it and no one has called her about this / Pt also mentioned that she was suppose to be scheduled for a mammogram as well / please advise

## 2022-01-11 NOTE — Telephone Encounter (Signed)
  Chief Complaint: Pt was seen by Geryl Rankins, NP and tests were supposed to be done to her left leg for nerve conduction.   No one ever called her.   She is following up. Symptoms: Pain down side of left leg for over a year Frequency: N/A Pertinent Negatives: Patient denies N/A Disposition: '[]'$ ED /'[]'$ Urgent Care (no appt availability in office) / '[]'$ Appointment(In office/virtual)/ '[]'$  [facility]/ '[]'$ Home Care/ '[]'$ Refused Recommended Disposition /'[]'$ [place] [facility]/ '[x]'$  Follow-up with PCP Additional Notes: I have sent a message to [facility] for Geryl Rankins, NP   Pt. Agreeable to someone calling her back.   Call after 3:00 because she is at work and can not answer until then.

## 2022-01-11 NOTE — Telephone Encounter (Signed)
Reason for Disposition  Leg pain or muscle cramp is a chronic symptom (recurrent or ongoing AND present > 4 weeks)    Was supposed to have tests done but no one ever called her.  Answer Assessment - Initial Assessment Questions 1. ONSET: "When did the pain start?"      I was supposed to do a nerve conduction test done a long time about over a year.    I saw the dr on Oct. 30 and was scheduled for a test on my leg.  2. LOCATION: "Where is the pain located?"      Left side of leg hurts down to my foot.   I've had surgery and everything and nothing has helped.    3. PAIN: "How bad is the pain?"    (Scale 1-10; or mild, moderate, severe)   -  MILD (1-3): doesn't interfere with normal activities    -  MODERATE (4-7): interferes with normal activities (e.g., work or school) or awakens from sleep, limping    -  SEVERE (8-10): excruciating pain, unable to do any normal activities, unable to walk     Moderate 4. WORK OR EXERCISE: "Has there been any recent work or exercise that involved this part of the body?"      No 5. CAUSE: "What do you think is causing the leg pain?"     Nerve 6. OTHER SYMPTOMS: "Do you have any other symptoms?" (e.g., chest pain, back pain, breathing difficulty, swelling, rash, fever, numbness, weakness)     Pain in my left pain 7. PREGNANCY: "Is there any chance you are pregnant?" "When was your last menstrual period?"     Not asked  Protocols used: Leg Pain-A-AH

## 2022-01-12 NOTE — Telephone Encounter (Signed)
Sent Referral by fax to   [facility]   [address] [place]  [address] PH# [phone] Fax # [phone]   Unable to contact patient at this time.

## 2022-01-12 NOTE — Telephone Encounter (Signed)
Already addressed in another encounter.

## 2022-01-25 ENCOUNTER — Ambulatory Visit: Payer: Self-pay

## 2022-01-25 ENCOUNTER — Telehealth: Payer: Self-pay | Admitting: Nurse Practitioner

## 2022-01-25 NOTE — Telephone Encounter (Signed)
Pt was told by Evelyn Crawford to call [facility]  and schedule appt from the referral that was sent. Pt states they told her they do not have this referral.  Please advise

## 2022-01-25 NOTE — Telephone Encounter (Signed)
  Chief Complaint: left leg pain and numbness and tingling Symptoms: severe pain, interfering with sleep, anterior lower leg feels tight Frequency: 2 years Pertinent Negatives: Patient denies back pain, chest pain, breathing difficulty, rash fever, weakness Disposition: '[]'$ ED /'[]'$ Urgent Care (no appt availability in office) / '[]'$ Appointment(In office/virtual)/ '[]'$  [facility]/ '[]'$ Home Care/ '[]'$ Refused Recommended Disposition /'[]'$ [facility]/ '[x]'$  Follow-up with PCP Additional Notes: pt stated she never received call for the referral to neurosurgery. Per noted advised pt call was made but she was not able t omake contact. Number in chart was verified with pt. Pt wanting to be seen as soon as possible. Next appts are in January. Please advise pt. Transferred pt to [facility] at [phone] to make appt. Reason for Disposition  Leg pain or muscle cramp is a chronic symptom (recurrent or ongoing AND present > 4 weeks)  Answer Assessment - Initial Assessment Questions 1. ONSET: "When did the pain start?"      2 years ago  2. LOCATION: "Where is the pain located?"      Left leg and foot  3. PAIN: "How bad is the pain?"    (Scale 1-10; or mild, moderate, severe)   -  MILD (1-3): doesn't interfere with normal activities    -  MODERATE (4-7): interferes with normal activities (e.g., work or school) or awakens from sleep, limping    -  SEVERE (8-10): excruciating pain, unable to do any normal activities, unable to walk     severe 4. WORK OR EXERCISE: "Has there been any recent work or exercise that involved this part of the body?"      no 5. CAUSE: "What do you think is causing the leg pain?"     Spine problem had surgery  6. OTHER SYMPTOMS: "Do you have any other symptoms?" (e.g., chest pain, back pain, breathing difficulty, swelling, rash, fever, numbness, weakness)     Numbness and tingling, unable to sleep , feels tight anterior lower leg ankle  7.  PREGNANCY: "Is there any chance you are pregnant?" "When was your last menstrual period?"     N/a  Protocols used: Leg Pain-A-AH

## 2022-02-07 ENCOUNTER — Other Ambulatory Visit: Payer: Self-pay | Admitting: Nurse Practitioner

## 2022-02-07 DIAGNOSIS — I10 Essential (primary) hypertension: Secondary | ICD-10-CM

## 2022-02-07 NOTE — Telephone Encounter (Signed)
Medication Refill - Medication:  hydrochlorothiazide (HYDRODIURIL) 25 MG tablet [[id number]]   Has the patient contacted their pharmacy? No. (Agent: If no, request that the patient contact the pharmacy for the refill. If patient does not wish to contact the pharmacy document the reason why and proceed with request.) (Agent: If yes, when and what did the pharmacy advise?)  Preferred Pharmacy (with phone number or street name):  [id number] [place]    Has the patient been seen for an appointment in the last year OR does the patient have an upcoming appointment? Yes.  Last OV 10.31.23  Agent: Please be advised that RX refills may take up to 3 business days. We ask that you follow-up with your pharmacy.

## 2022-02-08 MED ORDER — HYDROCHLOROTHIAZIDE 25 MG PO TABS: 25.0000 mg | ORAL_TABLET | Freq: Every day | ORAL | 1 refills | Status: DC

## 2022-02-08 NOTE — Telephone Encounter (Signed)
Requested Prescriptions  Pending Prescriptions Disp Refills   hydrochlorothiazide (HYDRODIURIL) 25 MG tablet 90 tablet 1    Sig: Take 1 tablet (25 mg total) by mouth daily.     Cardiovascular: Diuretics - Thiazide Passed - 02/07/2022  4:43 PM      Passed - Cr in normal range and within 180 days    Creat  Date Value Ref Range Status  11/02/2010 0.68 0.50 - 1.10 mg/dL Final   Creatinine, Ser  Date Value Ref Range Status  12/06/2021 0.80 0.57 - 1.00 mg/dL Final         Passed - K in normal range and within 180 days    Potassium  Date Value Ref Range Status  12/06/2021 3.5 3.5 - 5.2 mmol/L Final         Passed - Na in normal range and within 180 days    Sodium  Date Value Ref Range Status  12/06/2021 142 134 - 144 mmol/L Final         Passed - Last BP in normal range    BP Readings from Last 1 Encounters:  12/06/21 131/87         Passed - Valid encounter within last 6 months    Recent Outpatient Visits           2 months ago Encounter to establish care   [place], Vernia Buff, NP   10 months ago High triglycerides   [place], [place]   1 year ago Essential hypertension   [place], [place]   1 year ago Lumbar radiculopathy   [facility], [place], [place]   1 year ago Essential hypertension   [facility], [place], NP       Future Appointments             In 3 months Gildardo Pounds, NP [place]

## 2022-02-09 ENCOUNTER — Telehealth: Payer: Self-pay | Admitting: Nurse Practitioner

## 2022-02-09 NOTE — Telephone Encounter (Signed)
Referral Request - Has patient seen PCP for this complaint? yes *If NO, is insurance requiring patient see PCP for this issue before PCP can refer them? Referral for which specialty: ?/had referral for goot pain now needs one for left leg pain Preferred provider/office: ? Reason for referral: left leg pain

## 2022-02-13 ENCOUNTER — Telehealth: Payer: Self-pay | Admitting: Nurse Practitioner

## 2022-02-13 NOTE — Telephone Encounter (Signed)
Medication Refill - Medication:  hydrochlorothiazide (HYDRODIURIL) 25 MG tablet Shows at pharmacy, but pt was told its not there, request put in jan 2. Pt is all out of this med  Has the patient contacted their pharmacy? yes (Agent: If no, request that the patient contact the pharmacy for the refill. If patient does not wish to contact the pharmacy document the reason why and proceed with request.) (Agent: If yes, when and what did the pharmacy advise?)contact pcp  Preferred Pharmacy (with phone number or street name): [id number] - [place], [place]  Has the patient been seen for an appointment in the last year OR does the patient have an upcoming appointment? yes  Agent: Please be advised that RX refills may take up to 3 business days. We ask that you follow-up with your pharmacy.

## 2022-02-13 NOTE — Telephone Encounter (Signed)
[facility] called and spoke to [place], [facility] about the refill(s) hydrochlorothiazide requested. Advised it was sent on 02/08/22 #90/1 refill(s). She says it was received and filled the same day and is ready to be picked up. Patient called and advised of the above.

## 2022-04-19 ENCOUNTER — Other Ambulatory Visit: Payer: Self-pay

## 2022-05-18 ENCOUNTER — Ambulatory Visit
Admission: EM | Admit: 2022-05-18 | Discharge: 2022-05-18 | Disposition: A | Discharge status: Home / Self Care | Payer: Medicaid Other | Attending: Physician Assistant | Admitting: Physician Assistant

## 2022-05-18 ENCOUNTER — Ambulatory Visit (INDEPENDENT_AMBULATORY_CARE_PROVIDER_SITE_OTHER): Payer: Medicaid Other

## 2022-05-18 DIAGNOSIS — G629 Polyneuropathy, unspecified: Secondary | ICD-10-CM

## 2022-05-18 DIAGNOSIS — M79662 Pain in left lower leg: Secondary | ICD-10-CM | POA: Diagnosis not present

## 2022-05-18 MED ORDER — TRAMADOL HCL 50 MG PO TABS: 50.0000 mg | ORAL_TABLET | Freq: Four times a day (QID) | ORAL | 0 refills | Status: DC | PRN

## 2022-05-18 NOTE — ED Provider Notes (Signed)
[facility]    CSN: [id number] Arrival date & time: 05/18/22  1337      History   Chief Complaint Chief Complaint  Patient presents with   Leg Pain    HPI Evelyn Friesamela G Crawford is a 62 y.o. female.   She complains of pain in her left leg.  And is requesting an x-ray of her leg to make sure everything is okay.  Patient has a past medical history of radiculopathy to her left leg.  Patient had surgery at [facility] [place] for a nerve compression.  Patient reports that she was told that the pain was from a nerve in her back but it did not get better after she had surgery.  Patient also reports that she had a lipoma removed from her leg.  Patient reports having this removed did not improve her pain.  Patient has been on gabapentin and Neurontin in the past with no relief patient states she was supposed to have a nerve conduction study but she did not get it done.  Patient is requesting help with pain management   Leg Pain   Past Medical History:  Diagnosis Date   Alcohol abuse    Hypertension    Iron deficiency anemia    Thrombocytopenia    Tobacco abuse    Uterine fibroid     Patient Active Problem List   Diagnosis Date Noted   Essential hypertension 12/09/2020   High triglycerides 12/09/2020   Bleeding skin mole 12/09/2020   Lumbar radiculopathy 08/10/2020   Thrombocytopenia 11/22/2009   FIBROIDS, UTERUS 11/15/2009   ANEMIA, IRON DEFICIENCY 11/15/2009   ALCOHOL ABUSE 11/15/2009   TOBACCO ABUSE 11/15/2009    Past Surgical History:  Procedure Laterality Date   caesarean section     x3   CESAREAN SECTION     3X    OB History     Gravida  3   Para  3   Term  3   Preterm  0   AB  0   Living  3      SAB  0   IAB  0   Ectopic  0   Multiple  0   Live Births               Home Medications    Prior to Admission medications   Medication Sig Start Date End Date Taking? Authorizing Provider  atorvastatin (LIPITOR) 20 MG tablet  Take 1 tablet (20 mg total) by mouth daily. 12/07/21   Claiborne RiggFleming, Evelyn W, NP  hydrochlorothiazide (HYDRODIURIL) 25 MG tablet Take 1 tablet (25 mg total) by mouth daily. 02/08/22   Claiborne RiggFleming, Evelyn W, NP  traMADol (ULTRAM) 50 MG tablet Take 1 tablet (50 mg total) by mouth every 6 (six) hours as needed. 05/18/22 05/18/23 Yes Elson AreasSofia, Chavie Kolinski K, PA-C    Family History Family History  Problem Relation Age of Onset   Fibroids Mother    Fibroids Sister     Social History Social History   Tobacco Use   Smoking status: Every Day    Packs/day: .25    Types: Cigarettes   Smokeless tobacco: Never  Vaping Use   Vaping Use: Never used  Substance Use Topics   Alcohol use: Yes    Comment: daily 24 ounce beer every night   Drug use: No     Allergies   Patient has no known allergies.   Review of Systems Review of Systems  All other systems reviewed and are  negative.    Physical Exam Triage Vital Signs ED Triage Vitals  Enc Vitals Group     BP 05/18/22 1542 126/66     Pulse Rate 05/18/22 1542 74     Resp 05/18/22 1542 16     Temp 05/18/22 1542 98.1 F (36.7 C)     Temp Source 05/18/22 1542 Oral     SpO2 05/18/22 1542 96 %     Weight --      Height --      Head Circumference --      Peak Flow --      Pain Score 05/18/22 1544 10     Pain Loc --      Pain Edu? --      Excl. in GC? --    No data found.  Updated Vital Signs BP 126/66 (BP Location: Left Arm)   Pulse 74   Temp 98.1 F (36.7 C) (Oral)   Resp 16   LMP 09/17/2010   SpO2 96%   Visual Acuity Right Eye Distance:   Left Eye Distance:   Bilateral Distance:    Right Eye Near:   Left Eye Near:    Bilateral Near:     Physical Exam Vitals and nursing note reviewed.  Constitutional:      Appearance: She is well-developed.  HENT:     Head: Normocephalic.  Cardiovascular:     Rate and Rhythm: Normal rate.  Pulmonary:     Effort: Pulmonary effort is normal.  Abdominal:     General: There is no distension.   Musculoskeletal:        General: Normal range of motion.     Cervical back: Normal range of motion.  Skin:    General: Skin is warm.  Neurological:     General: No focal deficit present.     Mental Status: She is alert and oriented to person, place, and time.      UC Treatments / Results  Labs (all labs ordered are listed, but only abnormal results are displayed) Labs Reviewed - No data to display  EKG   Radiology DG Tibia/Fibula Left  Result Date: 05/18/2022 CLINICAL DATA:  Pain. EXAM: LEFT TIBIA AND FIBULA - 2 VIEW COMPARISON:  None Available. FINDINGS: There is no evidence of fracture or other focal bone lesions. Soft tissues are unremarkable. IMPRESSION: Negative. Electronically Signed   By: Ted Mcalpine M.D.   On: 05/18/2022 16:41    Procedures Procedures (including critical care time)  Medications Ordered in UC Medications - No data to display  Initial Impression / Assessment and Plan / UC Course  I have reviewed the triage vital signs and the nursing notes.  Pertinent labs & imaging results that were available during my care of the patient were reviewed by me and considered in my medical decision making (see chart for details).     X-ray shows no evidence of bony abnormality I discussed with patient at length her symptoms.  I feel like her symptoms are most likely caused from radiculopathy from her back or possible neuropathy.  Patient is advised she should follow-up for her nerve conduction studies and see her primary care doctor for ongoing treatment.  Patient has been on tramadol in the past from her primary care physician I will give her a short course of tramadol she is advised to schedule to see her provider for recheck Final Clinical Impressions(s) / UC Diagnoses   Final diagnoses:  Neuropathy     Discharge Instructions  Return if any problems.    ED Prescriptions     Medication Sig Dispense Auth. Provider   traMADol (ULTRAM) 50 MG  tablet Take 1 tablet (50 mg total) by mouth every 6 (six) hours as needed. 20 tablet Elson Areas, [place]     An After Visit Summary was printed and given to the patient.     I have reviewed the PDMP during this encounter.   Elson Areas, [place] 05/18/22 Paulo Fruit

## 2022-05-18 NOTE — ED Triage Notes (Signed)
Pt states she had back surgery 2 years ago and has been having pain in her left leg ever since. States the pain is getting worse. Has been taking tylenol and motrin at home with no relief.

## 2022-05-18 NOTE — Discharge Instructions (Addendum)
Return if any problems.

## 2022-06-06 ENCOUNTER — Encounter: Payer: Self-pay | Admitting: Nurse Practitioner

## 2022-06-06 ENCOUNTER — Ambulatory Visit: Payer: Medicaid Other | Attending: Nurse Practitioner | Admitting: Nurse Practitioner

## 2022-06-06 VITALS — BP 123/78 | HR 77 | Ht 67.0 in | Wt 159.2 lb

## 2022-06-06 DIAGNOSIS — I10 Essential (primary) hypertension: Secondary | ICD-10-CM | POA: Insufficient documentation

## 2022-06-06 DIAGNOSIS — Z79899 Other long term (current) drug therapy: Secondary | ICD-10-CM | POA: Insufficient documentation

## 2022-06-06 DIAGNOSIS — F17200 Nicotine dependence, unspecified, uncomplicated: Secondary | ICD-10-CM | POA: Diagnosis not present

## 2022-06-06 DIAGNOSIS — E781 Pure hyperglyceridemia: Secondary | ICD-10-CM | POA: Diagnosis not present

## 2022-06-06 DIAGNOSIS — G629 Polyneuropathy, unspecified: Secondary | ICD-10-CM | POA: Diagnosis not present

## 2022-06-06 DIAGNOSIS — Z1211 Encounter for screening for malignant neoplasm of colon: Secondary | ICD-10-CM

## 2022-06-06 DIAGNOSIS — M79605 Pain in left leg: Secondary | ICD-10-CM | POA: Insufficient documentation

## 2022-06-06 DIAGNOSIS — Z1231 Encounter for screening mammogram for malignant neoplasm of breast: Secondary | ICD-10-CM

## 2022-06-06 MED ORDER — ATORVASTATIN CALCIUM 20 MG PO TABS
20.0000 mg | ORAL_TABLET | Freq: Every day | ORAL | 1 refills | Status: DC
Start: 2022-06-06 — End: 2023-03-06

## 2022-06-06 MED ORDER — HYDROCHLOROTHIAZIDE 25 MG PO TABS
25.0000 mg | ORAL_TABLET | Freq: Every day | ORAL | 1 refills | Status: DC
Start: 2022-06-06 — End: 2022-09-06

## 2022-06-06 NOTE — Progress Notes (Signed)
Left leg has had no improvement.

## 2022-06-06 NOTE — Progress Notes (Signed)
Assessment & Plan:  Zoe was seen today for hypertension.  Diagnoses and all orders for this visit:  Neuropathy -     Ambulatory referral to Neurosurgery  Essential hypertension -     hydrochlorothiazide (HYDRODIURIL) 25 MG tablet; Take 1 tablet (25 mg total) by mouth daily.  Hypertriglyceridemia -     atorvastatin (LIPITOR) 20 MG tablet; Take 1 tablet (20 mg total) by mouth daily.  Colon cancer screening -     Ambulatory referral to Gastroenterology  Breast cancer screening by mammogram -     MM 3D SCREENING MAMMOGRAM BILATERAL BREAST; Future    Patient has been counseled on age-appropriate routine health concerns for screening and prevention. These are reviewed and up-to-date. Referrals have been placed accordingly. Immunizations are up-to-date or declined.    Subjective:   Chief Complaint  Patient presents with   Hypertension   HPI Evelyn Crawford 62 y.o. female presents to office today for follow up to HTN. She also reports persistent left leg pain.  Patient has been counseled on age-appropriate routine health concerns for screening and prevention. These are reviewed and up-to-date. Referrals have been placed accordingly. Immunizations are up-to-date or declined.     Mammogram: Overdue.  Referral placed today Colon cancer screening: Overdue.  Referral placed today Pap smear: Overdue.  Scheduled for next visit  She has a history of ETOH abuse, HTN, IDA, Thrombocytopenia, tobacco abuse, uterine fibroids and lumbar radiculopathy with numbness in left leg and left foot.   HTN Blood pressure is at goal. She is currently taking hydrochlorothiazide 25 mg daily. BP Readings from Last 3 Encounters:  06/06/22 123/78  05/18/22 126/66  12/06/21 131/87     Leg Pain She has chronic left leg pain with neuropathy. Patient has a past medical history of radiculopathy to her left leg and had surgery at [facility] in [place] for nerve compression. Patient reports that she was  told that the pain was from a nerve in her back but it did not get better after she had surgery. Patient also reports that she had a lipoma removed from her leg and despite having this removed her pain did not improve. Patient has been on gabapentin and lyrica in the past with no relief.  She is being followed by [facility] however appears to have been lost to follow-up for nerve conduction study.  Pain is described as burning, tingling, numbing and lower anterior/lateral shin and top of left foot.  She is a smoker.     Review of Systems  Constitutional:  Negative for fever, malaise/fatigue and weight loss.  HENT: Negative.  Negative for nosebleeds.   Eyes: Negative.  Negative for blurred vision, double vision and photophobia.  Respiratory: Negative.  Negative for cough and shortness of breath.   Cardiovascular: Negative.  Negative for chest pain, palpitations and leg swelling.  Gastrointestinal: Negative.  Negative for heartburn, nausea and vomiting.  Musculoskeletal:  Positive for joint pain. Negative for myalgias.  Neurological:  Positive for sensory change. Negative for dizziness, focal weakness, seizures and headaches.  Psychiatric/Behavioral: Negative.  Negative for suicidal ideas.     Past Medical History:  Diagnosis Date   Alcohol abuse    Hypertension    Iron deficiency anemia    Thrombocytopenia (HCC)    Tobacco abuse    Uterine fibroid     Past Surgical History:  Procedure Laterality Date   caesarean section     x3   CESAREAN SECTION  3X    Family History  Problem Relation Age of Onset   Fibroids Mother    Fibroids Sister     Social History Reviewed with no changes to be made today.   Outpatient Medications Prior to Visit  Medication Sig Dispense Refill   traMADol (ULTRAM) 50 MG tablet Take 1 tablet (50 mg total) by mouth every 6 (six) hours as needed. 20 tablet 0   atorvastatin (LIPITOR) 20 MG tablet Take 1 tablet (20 mg total) by mouth daily.  90 tablet 1   hydrochlorothiazide (HYDRODIURIL) 25 MG tablet Take 1 tablet (25 mg total) by mouth daily. 90 tablet 1   No facility-administered medications prior to visit.    No Known Allergies     Objective:    BP 123/78 (BP Location: Left Arm, Patient Position: Sitting, Cuff Size: Normal)   Pulse 77   Ht 5\' 7"  (1.702 m)   Wt 159 lb 3.2 oz (72.2 kg)   LMP 09/17/2010   SpO2 98%   BMI 24.93 kg/m  Wt Readings from Last 3 Encounters:  06/06/22 159 lb 3.2 oz (72.2 kg)  12/06/21 163 lb 4.8 oz (74.1 kg)  04/06/21 168 lb (76.2 kg)    Physical Exam Vitals and nursing note reviewed.  Constitutional:      Appearance: She is well-developed.  HENT:     Head: Normocephalic and atraumatic.  Cardiovascular:     Rate and Rhythm: Normal rate and regular rhythm.     Heart sounds: Normal heart sounds. No murmur heard.    No friction rub. No gallop.  Pulmonary:     Effort: Pulmonary effort is normal. No tachypnea or respiratory distress.     Breath sounds: Normal breath sounds. No decreased breath sounds, wheezing, rhonchi or rales.  Chest:     Chest wall: No tenderness.  Abdominal:     General: Bowel sounds are normal.     Palpations: Abdomen is soft.  Musculoskeletal:        General: Normal range of motion.     Cervical back: Normal range of motion.  Skin:    General: Skin is warm and dry.  Neurological:     Mental Status: She is alert and oriented to person, place, and time.     Coordination: Coordination normal.  Psychiatric:        Behavior: Behavior normal. Behavior is cooperative.        Thought Content: Thought content normal.        Judgment: Judgment normal.          Patient has been counseled extensively about nutrition and exercise as well as the importance of adherence with medications and regular follow-up. The patient was given clear instructions to go to ER or return to medical center if symptoms don't improve, worsen or new problems develop. The patient  verbalized understanding.   Follow-up: Return in about 3 months (around 09/05/2022) for PAP SMEAR.   Claiborne Rigg, FNP-BC [facility] [place], [place] [phone]   06/06/2022, 1:05 PM

## 2022-06-06 NOTE — Patient Instructions (Signed)
[facility]   [address] [place]  [address] PH# [phone] Fax # [phone]

## 2022-06-07 ENCOUNTER — Telehealth: Payer: Self-pay | Admitting: Nurse Practitioner

## 2022-06-07 NOTE — Telephone Encounter (Signed)
Copied from CRM [id number]. Topic: Referral - Status >> Jun 07, 2022 11:54 AM Macon Large wrote: Reason for CRM: Pt stated she can not make it to [facility] so she will need a referral for a location in [place] for nerve study. Pt stated she was given the phone# [phone] to [facility] to call and schedule an appt but when she called they told her that they need a referral to be sent to them.

## 2022-06-09 NOTE — Telephone Encounter (Signed)
Call to patient unanswered

## 2022-07-06 ENCOUNTER — Other Ambulatory Visit: Payer: Self-pay

## 2022-07-06 ENCOUNTER — Encounter (HOSPITAL_COMMUNITY): Payer: Self-pay | Admitting: *Deleted

## 2022-07-06 ENCOUNTER — Emergency Department (HOSPITAL_COMMUNITY)
Admission: EM | Admit: 2022-07-06 | Discharge: 2022-07-06 | Disposition: A | Discharge status: Home / Self Care | Payer: Medicaid Other | Attending: Emergency Medicine | Admitting: Emergency Medicine

## 2022-07-06 ENCOUNTER — Emergency Department (HOSPITAL_COMMUNITY): Payer: Medicaid Other

## 2022-07-06 DIAGNOSIS — R1010 Upper abdominal pain, unspecified: Secondary | ICD-10-CM

## 2022-07-06 DIAGNOSIS — Z720 Tobacco use: Secondary | ICD-10-CM | POA: Diagnosis not present

## 2022-07-06 DIAGNOSIS — I10 Essential (primary) hypertension: Secondary | ICD-10-CM | POA: Insufficient documentation

## 2022-07-06 DIAGNOSIS — K862 Cyst of pancreas: Secondary | ICD-10-CM | POA: Insufficient documentation

## 2022-07-06 DIAGNOSIS — R1013 Epigastric pain: Secondary | ICD-10-CM | POA: Diagnosis not present

## 2022-07-06 DIAGNOSIS — D696 Thrombocytopenia, unspecified: Secondary | ICD-10-CM | POA: Diagnosis not present

## 2022-07-06 DIAGNOSIS — E278 Other specified disorders of adrenal gland: Secondary | ICD-10-CM | POA: Diagnosis not present

## 2022-07-06 DIAGNOSIS — E876 Hypokalemia: Secondary | ICD-10-CM | POA: Insufficient documentation

## 2022-07-06 DIAGNOSIS — D259 Leiomyoma of uterus, unspecified: Secondary | ICD-10-CM | POA: Insufficient documentation

## 2022-07-06 DIAGNOSIS — K859 Acute pancreatitis without necrosis or infection, unspecified: Secondary | ICD-10-CM | POA: Diagnosis not present

## 2022-07-06 DIAGNOSIS — N9489 Other specified conditions associated with female genital organs and menstrual cycle: Secondary | ICD-10-CM

## 2022-07-06 DIAGNOSIS — Z79899 Other long term (current) drug therapy: Secondary | ICD-10-CM | POA: Diagnosis not present

## 2022-07-06 LAB — CBC
HCT: 39.5 % (ref 36.0–46.0)
Hemoglobin: 13.1 g/dL (ref 12.0–15.0)
MCH: 30.8 pg (ref 26.0–34.0)
MCHC: 33.2 g/dL (ref 30.0–36.0)
MCV: 92.7 fL (ref 80.0–100.0)
Platelets: 148 10*3/uL — ABNORMAL LOW (ref 150–400)
RBC: 4.26 MIL/uL (ref 3.87–5.11)
RDW: 12.2 % (ref 11.5–15.5)
WBC: 6.7 10*3/uL (ref 4.0–10.5)
nRBC: 0 % (ref 0.0–0.2)

## 2022-07-06 LAB — COMPREHENSIVE METABOLIC PANEL
ALT: 38 U/L (ref 0–44)
AST: 39 U/L (ref 15–41)
Albumin: 3.8 g/dL (ref 3.5–5.0)
Alkaline Phosphatase: 61 U/L (ref 38–126)
Anion gap: 9 (ref 5–15)
BUN: 8 mg/dL (ref 6–20)
CO2: 26 mmol/L (ref 22–32)
Calcium: 9.9 mg/dL (ref 8.9–10.3)
Chloride: 101 mmol/L (ref 98–111)
Creatinine, Ser: 0.66 mg/dL (ref 0.44–1.00)
GFR, Estimated: >60 mL/min (ref >60)
Glucose, Bld: 136 mg/dL — ABNORMAL HIGH (ref 70–99)
Potassium: 2.9 mmol/L — ABNORMAL LOW (ref 3.5–5.1)
Sodium: 136 mmol/L (ref 135–145)
Total Bilirubin: 0.8 mg/dL (ref 0.3–1.2)
Total Protein: 7.6 g/dL (ref 6.5–8.1)

## 2022-07-06 LAB — URINALYSIS, ROUTINE W REFLEX MICROSCOPIC
Bilirubin Urine: NEGATIVE
Glucose, UA: NEGATIVE mg/dL
Hgb urine dipstick: NEGATIVE
Ketones, ur: NEGATIVE mg/dL
Leukocytes,Ua: NEGATIVE
Nitrite: NEGATIVE
Protein, ur: NEGATIVE mg/dL
Specific Gravity, Urine: 1.019 (ref 1.005–1.030)
pH: 5 (ref 5.0–8.0)

## 2022-07-06 LAB — MAGNESIUM: Magnesium: 1.7 mg/dL (ref 1.7–2.4)

## 2022-07-06 LAB — LIPASE, BLOOD: Lipase: 138 U/L — ABNORMAL HIGH (ref 11–51)

## 2022-07-06 MED ORDER — FAMOTIDINE 20 MG PO TABS: 20.0000 mg | ORAL_TABLET | Freq: Two times a day (BID) | ORAL | 0 refills | Status: DC

## 2022-07-06 MED ORDER — ONDANSETRON 4 MG PO TBDP: 4.0000 mg | ORAL_TABLET | Freq: Three times a day (TID) | ORAL | 0 refills | Status: DC | PRN

## 2022-07-06 MED ORDER — ONDANSETRON HCL 4 MG/2ML IJ SOLN
4.0000 mg | Freq: Once | INTRAMUSCULAR | Status: AC
  Administered 2022-07-06: 4 mg via INTRAVENOUS
  Filled 2022-07-06: qty 2

## 2022-07-06 MED ORDER — SODIUM CHLORIDE (PF) 0.9 % IJ SOLN
INTRAMUSCULAR | Status: AC
  Filled 2022-07-06: qty 50

## 2022-07-06 MED ORDER — MORPHINE SULFATE (PF) 4 MG/ML IV SOLN
4.0000 mg | Freq: Once | INTRAVENOUS | Status: AC
  Administered 2022-07-06: 4 mg via INTRAVENOUS
  Filled 2022-07-06: qty 1

## 2022-07-06 MED ORDER — IOHEXOL 300 MG/ML  SOLN
100.0000 mL | Freq: Once | INTRAMUSCULAR | Status: AC | PRN
  Administered 2022-07-06: 100 mL via INTRAVENOUS

## 2022-07-06 MED ORDER — MAALOX MAX 400-400-40 MG/5ML PO SUSP: 10.0000 mL | Freq: Four times a day (QID) | ORAL | 0 refills | Status: DC | PRN

## 2022-07-06 MED ORDER — OXYCODONE HCL 5 MG PO TABS
5.0000 mg | ORAL_TABLET | Freq: Once | ORAL | Status: AC
  Administered 2022-07-06: 5 mg via ORAL
  Filled 2022-07-06: qty 1

## 2022-07-06 MED ORDER — OXYCODONE HCL 5 MG PO TABS: 5.0000 mg | ORAL_TABLET | Freq: Four times a day (QID) | ORAL | 0 refills | Status: DC | PRN

## 2022-07-06 MED ORDER — PANTOPRAZOLE SODIUM 40 MG IV SOLR
40.0000 mg | Freq: Once | INTRAVENOUS | Status: AC
  Administered 2022-07-06: 40 mg via INTRAVENOUS
  Filled 2022-07-06: qty 10

## 2022-07-06 MED ORDER — POTASSIUM CHLORIDE 20 MEQ PO PACK
60.0000 meq | PACK | Freq: Once | ORAL | Status: AC
  Administered 2022-07-06: 60 meq via ORAL
  Filled 2022-07-06: qty 3

## 2022-07-06 NOTE — Discharge Instructions (Addendum)
You have pancreatitis causing your abdominal pain.  You can take Tylenol for pain and if pain is still severe take the oxycodone as prescribed.  Do not take while driving a vehicle and avoid any alcohol use.  You can also take the Pepcid as prescribed and the Maalox to help with indigestion and burning pain.  Follow the eating plan listed in your discharge paperwork.  Like we discussed, avoid any alcohol.  You will need follow-up with gastroenterology listed in your discharge paperwork regarding your pancreatitis as well as pancreatic cyst noted on your pancreas.  You will need an MRI outpatient at some point.  Also, as we discussed there were multiple fibroids in your uterus and possibly a lesion on your left ovary.  I have referred you to gynecology oncology who should call you for outpatient follow-up and further imaging and workup.  If they do not call you, you can also call your gynecologist for further workup.  Come back if any severe worsening uncontrolled abdominal pain, uncontrollable nausea and vomiting, high fevers with pain, or any other symptoms concerning to you.

## 2022-07-06 NOTE — ED Provider Notes (Signed)
[place] EMERGENCY DEPARTMENT AT [facility] Provider Note   CSN: [id number] Arrival date & time: 07/06/22  4098     History  Chief Complaint  Patient presents with   Abdominal Pain    Evelyn Crawford is a 62 y.o. female.  With PMH of HTN, IDA, tobacco and alcohol use who presents with throbbing, stabbing upper abdominal pain for the past 4 days.  Patient notes drinking heavy over the weekend and she typically only drinks over the weekend as she has to work during the week.  She was drinking beer and liquor shots over the weekend and when she woke up Monday she has been complaining of constant burning stabbing epigastrium pain that radiates to her back since Monday.  It does worse with eating.  She has had no vomiting, no nausea, no fevers, no chills, no chest pain, no shortness of breath, no coughing or URI symptoms.  Denies any UTI symptoms.  Bowel movements have been liquidy and dark green in color but no melena or bright red blood per rectum.  She does not take any ibuprofen, aspirin or NSAIDs or anticoagulation.   Abdominal Pain      Home Medications Prior to Admission medications   Medication Sig Start Date End Date Taking? Authorizing Provider  alum & mag hydroxide-simeth (MAALOX MAX) 400-400-40 MG/5ML suspension Take 10 mLs by mouth every 6 (six) hours as needed for indigestion. 07/06/22  Yes Mardene Sayer, MD  famotidine (PEPCID) 20 MG tablet Take 1 tablet (20 mg total) by mouth 2 (two) times daily. 07/06/22  Yes Mardene Sayer, MD  ondansetron (ZOFRAN-ODT) 4 MG disintegrating tablet Take 1 tablet (4 mg total) by mouth every 8 (eight) hours as needed. 07/06/22  Yes Mardene Sayer, MD  oxyCODONE (ROXICODONE) 5 MG immediate release tablet Take 1 tablet (5 mg total) by mouth every 6 (six) hours as needed for up to 10 doses for severe pain. 07/06/22  Yes Mardene Sayer, MD  atorvastatin (LIPITOR) 20 MG tablet Take 1 tablet (20 mg total) by mouth daily.  06/06/22   Claiborne Rigg, NP  hydrochlorothiazide (HYDRODIURIL) 25 MG tablet Take 1 tablet (25 mg total) by mouth daily. 06/06/22   Claiborne Rigg, NP  traMADol (ULTRAM) 50 MG tablet Take 1 tablet (50 mg total) by mouth every 6 (six) hours as needed. 05/18/22 05/18/23  Elson Areas, PA-C      Allergies    Patient has no known allergies.    Review of Systems   Review of Systems  Gastrointestinal:  Positive for abdominal pain.    Physical Exam Updated Vital Signs BP (!) 123/92   Pulse 71   Temp 98.6 F (37 C) (Oral)   Resp 20   LMP 09/17/2010   SpO2 99%  Physical Exam Constitutional: Alert and oriented. Slightly uncomfortable but nontoxic, NAD Eyes: Conjunctivae are normal. ENT      Head: Normocephalic and atraumatic. Cardiovascular: S1, S2,  Normal and symmetric distal pulses are present in all extremities.Warm and well perfused. Respiratory: Normal respiratory effort. Breath sounds are normal. O2 sat 97 on RA Gastrointestinal: Soft and nondistended with epigastrium ttp and voluntary guarding as well as LUQ mild ttp. No CVAT Musculoskeletal: Normal range of motion in all extremities.      Right lower leg: No tenderness or edema.      Left lower leg: No tenderness or edema. Neurologic: Normal speech and language. Moving extremities x4. Sensation grossly intact. No gross  focal neurologic deficits are appreciated. Skin: Skin is warm, dry and intact. No rash noted. Psychiatric: Mood and affect are normal. Speech and behavior are normal.  ED Results / Procedures / Treatments   Labs (all labs ordered are listed, but only abnormal results are displayed) Labs Reviewed  LIPASE, BLOOD - Abnormal; Notable for the following components:      Result Value   Lipase 138 (*)    All other components within normal limits  COMPREHENSIVE METABOLIC PANEL - Abnormal; Notable for the following components:   Potassium 2.9 (*)    Glucose, Bld 136 (*)    All other components within normal  limits  CBC - Abnormal; Notable for the following components:   Platelets 148 (*)    All other components within normal limits  URINALYSIS, ROUTINE W REFLEX MICROSCOPIC - Abnormal; Notable for the following components:   APPearance HAZY (*)    All other components within normal limits  MAGNESIUM    EKG EKG Interpretation  Date/Time:  Thursday Jul 06 2022 10:33:08 EDT Ventricular Rate:  76 PR Interval:  169 QRS Duration: 88 QT Interval:  399 QTC Calculation: 449 R Axis:   -8 Text Interpretation: Sinus rhythm Consider left atrial enlargement Low voltage, precordial leads Abnormal R-wave progression, early transition No significant change since last tracing Confirmed by Vivien Rossetti ([id number]) on 07/06/2022 10:46:06 AM  Radiology CT ABDOMEN PELVIS W CONTRAST  Result Date: 07/06/2022 CLINICAL DATA:  Pancreatitis. EXAM: CT ABDOMEN AND PELVIS WITH CONTRAST TECHNIQUE: Multidetector CT imaging of the abdomen and pelvis was performed using the standard protocol following bolus administration of intravenous contrast. RADIATION DOSE REDUCTION: This exam was performed according to the departmental dose-optimization program which includes automated exposure control, adjustment of the mA and/or kV according to patient size and/or use of iterative reconstruction technique. CONTRAST:  OMNIPAQUE IOHEXOL 300 MG/ML  SOLN COMPARISON:  CT 01/23/2015 FINDINGS: Lower chest: There is some linear opacity lung bases likely scar or atelectasis. No pleural effusion. Breathing motion identified. Hepatobiliary: Diffuse fatty liver infiltration. Patent portal vein. Gallbladder is present. Pancreas: Preserved pancreatic parenchyma in enhancement. There is some stranding of the fat surrounding the pancreatic head and proximal duodenal. The pancreatic parenchyma is slightly edematous in the head. There is also a tiny cystic focus along the midbody of the pancreas measuring 7 mm on series 2, image 20. This has not seen  on the study of 2016. No well-defined fluid collections in and around the pancreas. Spleen: Preserved spleen.  The splenic vein is patent. Adrenals/Urinary Tract: Right adrenal gland is preserved. There is a left adrenal nodule which is unchanged from study of 2016 consistent with a benign lesion of 2.5 cm consistent with an adenoma. Hounsfield unit on renal delay of 48 and portal venous phase 105. Relative washout. No enhancing renal mass or collecting system dilatation. The ureters have normal course and caliber down to the bladder. Preserved contours of the urinary bladder. Stomach/Bowel: The stomach and small bowel are nondilated. On this non oral contrast exam the large bowel has a few colonic diverticula. Scattered mild-to-moderate colonic stool. Normal appendix extends posterior to the cecum in the right hemipelvis. Vascular/Lymphatic: Aortic atherosclerosis. No enlarged abdominal or pelvic lymph nodes. Reproductive: Multilobular uterus with some exophytic areas consistent with multiple fibroids. The exophytic left-sided fibroid is more calcified today than 2016. Again some of these are pedunculated. There is a soft tissue nodule anterior to the left lateral fibroid on series 2, image 66 measuring 2.9 cm. This  could represent an ovarian solid mass recommend further evaluation as this is new from prior. Other: Small fat containing umbilical hernia with slight rectus muscle diastasis. No free intra-abdominal air. Trace pelvic free fluid. Nonspecific Musculoskeletal: No acute or significant osseous findings. IMPRESSION: Mild edema and surrounding stranding involving the area of the pancreatic head and second portion duodenal. With history this could represent interstitial pancreatitis. No fluid collections are identified. Preserved spleen and splenic vein. Overall preserved pancreatic enhancement. Recommend follow-up. Fatty liver infiltration.  Gallbladder is nondilated. Multiple uterine fibroids some of which  are pedunculated. However there is new 2.9 cm soft tissue nodule in the left adnexa which could be ovarian. Possible solid ovarian lesion. Recommend correlation any known history or further workup such as pelvic ultrasound or female pelvic MRI with and without contrast when appropriate. 7 mm mid body pancreatic cystic lesion. Based on appearance and patient's age, recommend follow up imaging in 1 year. Electronically Signed   By: Karen Kays M.D.   On: 07/06/2022 12:12    Procedures Procedures    Medications Ordered in ED Medications  morphine (PF) 4 MG/ML injection 4 mg (4 mg Intravenous Given 07/06/22 1044)  ondansetron (ZOFRAN) injection 4 mg (4 mg Intravenous Given 07/06/22 1044)  pantoprazole (PROTONIX) injection 40 mg (40 mg Intravenous Given 07/06/22 1044)  iohexol (OMNIPAQUE) 300 MG/ML solution 100 mL (100 mLs Intravenous Contrast Given 07/06/22 1138)  potassium chloride (KLOR-CON) packet 60 mEq (60 mEq Oral Given 07/06/22 1220)  oxyCODONE (Oxy IR/ROXICODONE) immediate release tablet 5 mg (5 mg Oral Given 07/06/22 1259)    ED Course/ Medical Decision Making/ A&P Clinical Course as of 07/06/22 1854  Thu Jul 06, 2022  1236 Patient is eating and drinking and tolerating p.o. at bedside.  She still has some pain but feels like she can be controlled at home and prefers to be discharged home.  I did discuss incidental findings of pancreatic cyst requiring follow-up outpatient which I have also referred to GI for as well as referral to gynecology for outpatient follow-up and workup of ovarian lesion and fibroids.  She is in agreement with plan.  Return precautions discussed.  Discharged with pain and nausea control medications as well as GERD medications. [VB]    Clinical Course User Index [VB] Mardene Sayer, MD                             Medical Decision Making MOLLYANN BARROZO is a 62 y.o. female.  With PMH of HTN, IDA, tobacco and alcohol use who presents with throbbing, stabbing upper  abdominal pain for the past 4 days.   Based on the patient's epigastric pain, differential includes but is not limited to cholelithiasis, cholecystitis, hepatitis, GERD, PUD, pancreatitis,  among multiple other etiologies. Less likely nephrolithiasis or pyelonephritis with no CVAT and no urinary symptoms. Less likely pulmonary source such as pneumonia with no cardiopulmonary complaints, no hypoxia, and no increased work of breathing. Less likely atypical ACS with no risk factors and no cardiopulmonary complaints.  Additionally, EKG with no acute changes from prior.  Labs reviewed by me with elevated lipase 138 suspect likely mild pancreatitis and etoh related after drinking this weekend.  This is new for patient.  She also mild hypokalemia 2.9 creatinine 0.66.  Repleted potassium in ED. Normal white blood cell count 6.7.  Mild thrombocytopenia 148.  UA not consistent with UTI.  Magnesium within normal limits.  CTAP with contrast  obtained which showed evidence of interstitial pancreatitis consistent with patient's pain and presentation.  Gallbladder unremarkable.  Also noted to have incidental finding of multiple uterine fibroids and new tissue nodule on the left adnexa.  Additionally, incidental finding of 7 mm pancreatic cystic lesion.  On reassessment after pain medications, Patient is eating and drinking and tolerating p.o. at bedside.  She still has some pain but feels like she can be controlled at home and prefers to be discharged home.  I did discuss incidental findings of pancreatic cyst requiring follow-up outpatient which I have also referred to GI for as well as referral to gynecology for outpatient follow-up and workup of ovarian lesion and fibroids. Discussed pancreatitis eating plan and no alcohol. She is in agreement with plan.  Return precautions discussed.  Discharged with pain and nausea control medications as well as GERD medications.    Amount and/or Complexity of Data Reviewed Labs:  ordered. Radiology: ordered.  Risk OTC drugs. Prescription drug management.       Final Clinical Impression(s) / ED Diagnoses Final diagnoses:  Pain of upper abdomen  Acute pancreatitis, unspecified complication status, unspecified pancreatitis type  Pancreatic cyst  Uterine leiomyoma, unspecified location  Adnexal mass    Rx / DC Orders ED Discharge Orders          Ordered    oxyCODONE (ROXICODONE) 5 MG immediate release tablet  Every 6 hours PRN        07/06/22 1247    ondansetron (ZOFRAN-ODT) 4 MG disintegrating tablet  Every 8 hours PRN        07/06/22 1247    famotidine (PEPCID) 20 MG tablet  2 times daily        07/06/22 1247    alum & mag hydroxide-simeth (MAALOX MAX) 400-400-40 MG/5ML suspension  Every 6 hours PRN        07/06/22 1247    Ambulatory referral to Gastroenterology        07/06/22 1247    Ambulatory referral to Gynecologic Oncology        07/06/22 1247              Mardene Sayer, MD 07/06/22 [id number]

## 2022-07-06 NOTE — ED Triage Notes (Signed)
Pt is here for abdominal pain which has been ongoing since Monday.  No n/v or fever with this. LBM was last pm

## 2022-07-11 ENCOUNTER — Encounter: Payer: Self-pay | Admitting: Internal Medicine

## 2022-07-11 ENCOUNTER — Ambulatory Visit (INDEPENDENT_AMBULATORY_CARE_PROVIDER_SITE_OTHER): Payer: Medicaid Other | Admitting: Internal Medicine

## 2022-07-11 VITALS — BP 120/62 | HR 83 | Ht 67.0 in | Wt 155.0 lb

## 2022-07-11 DIAGNOSIS — F1010 Alcohol abuse, uncomplicated: Secondary | ICD-10-CM

## 2022-07-11 DIAGNOSIS — R935 Abnormal findings on diagnostic imaging of other abdominal regions, including retroperitoneum: Secondary | ICD-10-CM | POA: Diagnosis not present

## 2022-07-11 DIAGNOSIS — K859 Acute pancreatitis without necrosis or infection, unspecified: Secondary | ICD-10-CM | POA: Diagnosis not present

## 2022-07-11 DIAGNOSIS — R1013 Epigastric pain: Secondary | ICD-10-CM | POA: Diagnosis not present

## 2022-07-11 DIAGNOSIS — Z1211 Encounter for screening for malignant neoplasm of colon: Secondary | ICD-10-CM | POA: Diagnosis not present

## 2022-07-11 DIAGNOSIS — Z720 Tobacco use: Secondary | ICD-10-CM

## 2022-07-11 NOTE — Progress Notes (Signed)
HISTORY OF PRESENT ILLNESS:  Evelyn Crawford is a 62 y.o. female, [place], with a history of hypertension and chronic alcohol abuse who was sent today by [name] emergency room after being seen recently for acute pancreatitis.  Patient reports developing intermittent epigastric pain in early May.  This continued to varying degrees till the end of May when it was quite severe.  She presented to the emergency room on Jul 06, 2022.  At that time her lipase was slightly elevated at 138.  Normal white blood cell count.  Normal liver test.  CT scan revealed changes in the pancreas consistent with interstitial pancreatitis.  Incidental GYN findings for which she is planning to see the oncology.  She has not had alcohol in 6 days.  Her drinking is quite heavily.  Can drink 1/5 of vodka and 1 day easily.  Has done so for many years.  She still has minimal but significantly improved abdominal pain.  No nausea or vomiting.  She is a chronic smoker.  No prior GI history.  A note from her PCP in April stated that she was going to be referred to GI for colon cancer screening.  REVIEW OF SYSTEMS:  All non-GI ROS negative unless otherwise stated in the HPI.  Past Medical History:  Diagnosis Date   Alcohol abuse    Hypertension    Iron deficiency anemia    Thrombocytopenia (HCC)    Tobacco abuse    Uterine fibroid     Past Surgical History:  Procedure Laterality Date   caesarean section     x3   CESAREAN SECTION     3X    Social History [name]  reports that she has been smoking cigarettes. She has a 5.75 pack-year smoking history. She has never used smokeless tobacco. She reports current alcohol use. She reports that she does not use drugs.  family history includes Fibroids in her mother and sister.  No Known Allergies     PHYSICAL EXAMINATION: Vital signs: BP 120/62   Pulse 83   Ht 5\' 7"  (1.702 m)   Wt 155 lb (70.3 kg)   LMP 09/17/2010    BMI 24.28 kg/m   Constitutional: generally well-appearing, no acute distress.  Reeks of tobacco smoke Psychiatric: alert and oriented x3, cooperative Eyes: extraocular movements intact, anicteric but bloodshot, conjunctiva pink Mouth: oral pharynx moist, no lesions Neck: supple no lymphadenopathy Cardiovascular: heart regular rate and rhythm, no murmur Lungs: clear to auscultation bilaterally Abdomen: soft, mild epigastric tenderness, nondistended, no obvious ascites, no peritoneal signs, normal bowel sounds, no organomegaly Rectal: Omitted Extremities: no clubbing, cyanosis, or lower extremity edema bilaterally Skin: no lesions on visible extremities Neuro: No focal deficits.  Cranial nerves intact  ASSESSMENT:  1.  Acute pancreatitis.  Etiology likely alcohol. 2.  Chronic alcohol abuse 3.  Chronic tobacco abuse 4.  Colon cancer screening   PLAN:  1.  Advised to avoid all alcohol indefinitely 2.  Avoid fatty foods 3.  Schedule MRI of the pancreas in about 4 weeks.  Rule out other causes for pancreatitis.  Evaluate small cystic lesion. 4.  GI office follow-up 2 months.  At that time she is doing better, can discuss in more detail colon cancer screening.  We briefly touched on the subject today. 5.  Ongoing general medical care with PCP Total time 60 minutes was spent preparing to see the patient, reviewing the myriad of outside data including office notes, ER  evaluations, blood work, and x-rays.  Obtaining comprehensive history, performing medically appropriate physical exam, counseling and educating the patient regarding the above listed issues, ordering advanced radiology, setting up follow-up, and documenting clinical information in the health record

## 2022-07-11 NOTE — Patient Instructions (Signed)
You have been scheduled for an MRI at [facility]  on 08/08/2022. Your appointment time is 10:00am. Please arrive to admitting (at main entrance of the hospital) 30 minutes prior to your appointment time for registration purposes. Please make certain not to have anything to eat or drink 4 hours prior to your test. In addition, if you have any metal in your body, have a pacemaker or defibrillator, please be sure to let your ordering physician know. This test typically takes 45 minutes to 1 hour to complete. Should you need to reschedule, please call [phone] to do so.  Make sure to avoid alcohol and greasy foods.

## 2022-07-12 ENCOUNTER — Telehealth: Payer: Self-pay | Admitting: *Deleted

## 2022-07-12 NOTE — Telephone Encounter (Signed)
Spoke with the patient regarding the referral to GYN oncology. Patient scheduled as new patient with Dr Alvester Morin  on 6/24 at 9:45 am. Patient given an arrival time of 9:15 am.  Explained to the patient the the doctor will perform a pelvic exam at this visit. Patient given the policy that only one visitor allowed and that visitor must be over 16 yrs are allowed in the Cancer Center. Patient given the address/phone number for the clinic and that the center offers free valet service. Patient aware that masks are option.

## 2022-07-17 ENCOUNTER — Ambulatory Visit: Payer: PRIVATE HEALTH INSURANCE

## 2022-07-25 DIAGNOSIS — G609 Hereditary and idiopathic neuropathy, unspecified: Secondary | ICD-10-CM | POA: Diagnosis not present

## 2022-07-27 ENCOUNTER — Telehealth: Payer: Self-pay | Admitting: Internal Medicine

## 2022-07-27 NOTE — Telephone Encounter (Signed)
Inbound call from patient's insurance stating the the place where MRI was sent to for the patient is out of network. Needs a clerical reason as to why the patient needs MRI. Phone number is [phone]. Also stated could be sent to a imaging location that is in network. Please advise, thank you.

## 2022-07-27 NOTE — Telephone Encounter (Signed)
Spoke to someone at [employer] and told him the facility should be in network because it is [facility], just like the doctors office where she was seen.  They are going to look at it again and call me back

## 2022-07-31 ENCOUNTER — Inpatient Hospital Stay: Payer: No Typology Code available for payment source | Attending: Psychiatry

## 2022-07-31 ENCOUNTER — Other Ambulatory Visit: Payer: Self-pay

## 2022-07-31 ENCOUNTER — Inpatient Hospital Stay (HOSPITAL_BASED_OUTPATIENT_CLINIC_OR_DEPARTMENT_OTHER): Payer: No Typology Code available for payment source | Admitting: Psychiatry

## 2022-07-31 ENCOUNTER — Other Ambulatory Visit (HOSPITAL_COMMUNITY)
Admission: RE | Admit: 2022-07-31 | Discharge: 2022-07-31 | Disposition: A | Discharge status: Home / Self Care | Payer: No Typology Code available for payment source | Source: Ambulatory Visit

## 2022-07-31 ENCOUNTER — Encounter: Payer: Self-pay | Admitting: Psychiatry

## 2022-07-31 VITALS — BP 120/72 | HR 63 | Temp 98.2°F | Resp 20 | Ht 65.91 in | Wt 153.8 lb

## 2022-07-31 DIAGNOSIS — Z1211 Encounter for screening for malignant neoplasm of colon: Secondary | ICD-10-CM | POA: Diagnosis not present

## 2022-07-31 DIAGNOSIS — I10 Essential (primary) hypertension: Secondary | ICD-10-CM | POA: Diagnosis not present

## 2022-07-31 DIAGNOSIS — K858 Other acute pancreatitis without necrosis or infection: Secondary | ICD-10-CM | POA: Diagnosis not present

## 2022-07-31 DIAGNOSIS — F1010 Alcohol abuse, uncomplicated: Secondary | ICD-10-CM | POA: Diagnosis not present

## 2022-07-31 DIAGNOSIS — F17210 Nicotine dependence, cigarettes, uncomplicated: Secondary | ICD-10-CM | POA: Diagnosis not present

## 2022-07-31 DIAGNOSIS — R1909 Other intra-abdominal and pelvic swelling, mass and lump: Secondary | ICD-10-CM

## 2022-07-31 DIAGNOSIS — C541 Malignant neoplasm of endometrium: Secondary | ICD-10-CM

## 2022-07-31 DIAGNOSIS — R102 Pelvic and perineal pain: Secondary | ICD-10-CM | POA: Insufficient documentation

## 2022-07-31 DIAGNOSIS — R1900 Intra-abdominal and pelvic swelling, mass and lump, unspecified site: Secondary | ICD-10-CM | POA: Diagnosis not present

## 2022-07-31 DIAGNOSIS — Z1273 Encounter for screening for malignant neoplasm of ovary: Secondary | ICD-10-CM | POA: Insufficient documentation

## 2022-07-31 DIAGNOSIS — D696 Thrombocytopenia, unspecified: Secondary | ICD-10-CM | POA: Insufficient documentation

## 2022-07-31 DIAGNOSIS — N9412 Deep dyspareunia: Secondary | ICD-10-CM | POA: Insufficient documentation

## 2022-07-31 DIAGNOSIS — Z124 Encounter for screening for malignant neoplasm of cervix: Secondary | ICD-10-CM

## 2022-07-31 DIAGNOSIS — K862 Cyst of pancreas: Secondary | ICD-10-CM | POA: Diagnosis not present

## 2022-07-31 DIAGNOSIS — D259 Leiomyoma of uterus, unspecified: Secondary | ICD-10-CM | POA: Diagnosis not present

## 2022-07-31 LAB — CEA (ACCESS): CEA (CHCC): 3.93 ng/mL (ref 0.00–5.00)

## 2022-07-31 NOTE — Patient Instructions (Signed)
It was a pleasure to see you in clinic today. - I recommended blood work today (tumor markers) and a pelvic MRI - Return visit planned for after MRI complete to review results and make final plan  Thank you very much for allowing me to provide care for you today.  I appreciate your confidence in choosing our Gynecologic Oncology team at [facility].  If you have any questions about your visit today please call our office or send [place] a MyChart message and we will get back to you as soon as possible.

## 2022-07-31 NOTE — Progress Notes (Signed)
GYNECOLOGIC ONCOLOGY NEW PATIENT CONSULTATION  Date of Service: 07/31/2022 Referring Provider: Vivien Rossetti, MD   ASSESSMENT AND PLAN: Evelyn Crawford is a 62 y.o. woman with a solid left adnexal lesion incidentally identified at time of CT scan for upper abdominal pain, pancreatitis.  We reviewed that the exact etiology of the pelvic mass is unclear, but could include a benign, borderline, or malignant process.  We also reviewed that at this time it is unclear if this is an ovarian mass versus pedunculated fibroid from the uterus.  I recommend additional imaging at this time to further characterize the mass.  I recommend an MRI pelvis.  Also recommend tumor markers in the meantime.  CEA and CA125 ordered.  Patient to the lab today.  Treatment plan would somewhat be guided by imaging and tumor marker results.  Patient does report pelvic pain that is bothersome to her.  In particular, she has avoided intercourse over the past several years due to pain with intercourse.  Given this, hysterectomy may be reasonable regardless for definitive diagnosis and treatment for pelvic pain.  Patient is interested in this.  Reviewed that exact surgical plan and recommendations would depend on workup as above, likely based on exam could undergo a minimally invasive surgery.  Would potentially require a mini laparotomy for specimen retrieval.  Pap smear collected today for updated screening prior to possible hysterectomy.  We will have patient follow-up after MRI is completed to review results and determine final treatment plan.   A copy of this note was sent to the patient's referring provider.  Clide Cliff, MD Gynecologic Oncology   Medical Decision Making I personally spent  TOTAL 50 minutes face-to-face and non-face-to-face in the care of this patient, which includes all pre, intra, and post visit time on the date of service.  ------------  CC: solid adnexal lesion  HISTORY OF PRESENT  ILLNESS:  Evelyn Crawford is a 62 y.o. woman who is seen in consultation at the request of Vivien Rossetti, MD for evaluation of incidental finding of solid adnexal lesion.  Patient presented to the emergency department on 07/06/2022 for throbbing, stabbing upper abdominal pain x 4 days.  She describes the pain as constant and burning, stabbing epigastric pain that radiates to her back.  It is worse with eating.  She notes heavy drinking over the weekend.  She underwent a CT abdomen/pelvis she was noted to have some mild edema and stranding in the area of the pancreatic head consistent with interstitial pancreatitis.  She was additionally noted to have multiple uterine fibroids and a new 2.9 cm soft tissue nodule in the left adnexa.  She was recommended outpatient follow-up with GI regarding her pancreatic cyst and incidental findings on imaging as well as follow-up with gynecology regarding her incidental pelvic findings.  Patient was seen by gastroenterology on 07/11/2022.  She was recommended to avoid alcohol, avoid fatty foods.  They also recommended MRI of pancreas in about 4 weeks.  This is scheduled for 08/08/2022.  Today she reports that she has a known history of fibroids.  She does note pain in her pelvis that comes and goes.  She describes it as crampy but does not last long.  She does not take anything for the pain when it occurs.  She denies any postmenopausal bleeding or spotting.  She does note that she has not had sex in probably 2 years due to pain with intercourse that is predominantly with deep penetration.  She reports today she  has gone 26 days without drinking alcohol.   PAST MEDICAL HISTORY: Past Medical History:  Diagnosis Date   Alcohol abuse    Hypertension    Iron deficiency anemia    Thrombocytopenia (HCC)    Tobacco abuse    Uterine fibroid     PAST SURGICAL HISTORY: Past Surgical History:  Procedure Laterality Date   CESAREAN SECTION     3X    OB/GYN  HISTORY: OB History  Gravida Para Term Preterm AB Living  3 3 3  0 0 3  SAB IAB Ectopic Multiple Live Births  0 0 0 0 3    # Outcome Date GA Lbr Len/2nd Weight Sex Delivery Anes PTL Lv  3 Term      CS-Unspec   LIV  2 Term      CS-Unspec   LIV  1 Term      CS-Unspec   LIV      Age at menarche: 66 Age at menopause: 15 Hx of HRT: no Hx of STI: no Last pap: 01/16/2019, NILM, HPV neg History of abnormal pap smears: no  SCREENING STUDIES:  Last mammogram: Has not had one, needs to schedule (orders in) Last colonoscopy: 11/2009  MEDICATIONS:  Current Outpatient Medications:    atorvastatin (LIPITOR) 20 MG tablet, Take 1 tablet (20 mg total) by mouth daily., Disp: 90 tablet, Rfl: 1   hydrochlorothiazide (HYDRODIURIL) 25 MG tablet, Take 1 tablet (25 mg total) by mouth daily., Disp: 90 tablet, Rfl: 1  ALLERGIES: No Known Allergies  FAMILY HISTORY: Family History  Problem Relation Age of Onset   Fibroids Mother    Fibroids Sister    Colon cancer Neg Hx    Stomach cancer Neg Hx    Esophageal cancer Neg Hx    Breast cancer Neg Hx    Ovarian cancer Neg Hx    Endometrial cancer Neg Hx     SOCIAL HISTORY: Social History   Socioeconomic History   Marital status: Single    Spouse name: Not on file   Number of children: 0   Years of education: Not on file   Highest education level: Not on file  Occupational History   Occupation: Biomedical scientist  Tobacco Use   Smoking status: Every Day    Packs/day: 0.25    Years: 23.00    Additional pack years: 0.00    Total pack years: 5.75    Types: Cigarettes   Smokeless tobacco: Never  Vaping Use   Vaping Use: Never used  Substance and Sexual Activity   Alcohol use: Yes    Comment: daily 24 ounce beer every night drink liquor on weekend   Drug use: No   Sexual activity: Yes    Birth control/protection: Post-menopausal  Other Topics Concern   Not on file  Social History Narrative   Patient needs to submit further paperwork to  complete   [name]  November 29, 2009 11:51 AM      Unemployed   Previously worked at a school for 11 years, 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  Lives with grown daughter.  Pt is single, sexually active with men only.          Social Determinants of Health   Financial Resource Strain: Not on file  Food Insecurity: Not on file  Transportation Needs: Not on file  Physical Activity: Not on file  Stress: Not on file  Social Connections: Not on file  Intimate Partner Violence: Not on  file    REVIEW OF SYSTEMS: New patient intake form was reviewed.  Complete 10-system review is negative except for the following: hotflashes  PHYSICAL EXAM: BP 120/72 (BP Location: Left Arm, Patient Position: Sitting)   Pulse 63   Temp 98.2 F (36.8 C)   Resp 20   Ht 5' 5.91" (1.674 m)   Wt 153 lb 12.8 oz (69.8 kg)   LMP 09/17/2010   SpO2 100%   BMI 24.89 kg/m  Constitutional: No acute distress. Neuro/Psych: Alert, oriented.  Head and Neck: Normocephalic, atraumatic. Neck symmetric without masses. Sclera anicteric.  Respiratory: Normal work of breathing. Clear to auscultation bilaterally. Cardiovascular: Regular rate and rhythm, no murmurs, rubs, or gallops. Abdomen: Normoactive bowel sounds. Soft, non-distended, non-tender to palpation. No masses appreciated.  Well healed pfannenstiel incision. Extremities: Grossly normal range of motion. Warm, well perfused. No edema bilaterally. Skin: No rashes or lesions. Lymphatic: No cervical, supraclavicular, or inguinal adenopathy. Genitourinary: External genitalia without lesions. Urethral meatus without lesions or prolapse. On speculum exam, vagina and cervix without lesions.Cervix deviated to the right. Pap smear collected. Bimanual exam reveals normal cervix, mildly enlarged irregular uterus. Solid lesion palpated in the left adnexa, difficult to discern if separate from uterus but feels possibly contiguous with uterus.  Exam chaperoned by  Andrey Cota, RN   LABORATORY AND RADIOLOGIC DATA: Outside medical records were reviewed to synthesize the above history, along with the history and physical obtained during the visit.  Outside laboratory, and imaging reports were reviewed, with pertinent results below.  I personally reviewed the outside images.  WBC  Date Value Ref Range Status  07/06/2022 6.7 4.0 - 10.5 K/uL Final   Hemoglobin  Date Value Ref Range Status  07/06/2022 13.1 12.0 - 15.0 g/dL Final  16/11/9602 54.0 11.1 - 15.9 g/dL Final   HCT  Date Value Ref Range Status  07/06/2022 39.5 36.0 - 46.0 % Final   Hematocrit  Date Value Ref Range Status  12/06/2021 37.4 34.0 - 46.6 % Final   Platelets  Date Value Ref Range Status  07/06/2022 148 (L) 150 - 400 K/uL Final  12/06/2021 161 150 - 450 x10E3/uL Final   LDH  Date Value Ref Range Status  11/13/2009 103 94 - 250 U/L Final   Magnesium  Date Value Ref Range Status  07/06/2022 1.7 1.7 - 2.4 mg/dL Final    Comment:    Performed at [facility], [address], [place], [place] [address]   Creat  Date Value Ref Range Status  11/02/2010 0.68 0.50 - 1.10 mg/dL Final   Creatinine, Ser  Date Value Ref Range Status  07/06/2022 0.66 0.44 - 1.00 mg/dL Final   AST  Date Value Ref Range Status  07/06/2022 39 15 - 41 U/L Final   ALT  Date Value Ref Range Status  07/06/2022 38 0 - 44 U/L Final   Diagnosis  Date Value Ref Range Status  01/16/2019   Final   - Negative for intraepithelial lesion or malignancy (NILM)    CT ABDOMEN PELVIS W CONTRAST 07/06/2022  Narrative CLINICAL DATA:  Pancreatitis.  EXAM: CT ABDOMEN AND PELVIS WITH CONTRAST  TECHNIQUE: Multidetector CT imaging of the abdomen and pelvis was performed using the standard protocol following bolus administration of intravenous contrast.  RADIATION DOSE REDUCTION: This exam was performed according to the departmental dose-optimization program which includes  automated exposure control, adjustment of the mA and/or kV according to patient size and/or use of iterative reconstruction technique.  CONTRAST:  OMNIPAQUE IOHEXOL 300 MG/ML  SOLN  COMPARISON:  CT 01/23/2015  FINDINGS: Lower chest: There is some linear opacity lung bases likely scar or atelectasis. No pleural effusion. Breathing motion identified.  Hepatobiliary: Diffuse fatty liver infiltration. Patent portal vein. Gallbladder is present.  Pancreas: Preserved pancreatic parenchyma in enhancement. There is some stranding of the fat surrounding the pancreatic head and proximal duodenal. The pancreatic parenchyma is slightly edematous in the head. There is also a tiny cystic focus along the midbody of the pancreas measuring 7 mm on series 2, image 20. This has not seen on the study of 2016. No well-defined fluid collections in and around the pancreas.  Spleen: Preserved spleen.  The splenic vein is patent.  Adrenals/Urinary Tract: Right adrenal gland is preserved. There is a left adrenal nodule which is unchanged from study of 2016 consistent with a benign lesion of 2.5 cm consistent with an adenoma. Hounsfield unit on renal delay of 48 and portal venous phase 105. Relative washout. No enhancing renal mass or collecting system dilatation. The ureters have normal course and caliber down to the bladder. Preserved contours of the urinary bladder.  Stomach/Bowel: The stomach and small bowel are nondilated. On this non oral contrast exam the large bowel has a few colonic diverticula. Scattered mild-to-moderate colonic stool. Normal appendix extends posterior to the cecum in the right hemipelvis.  Vascular/Lymphatic: Aortic atherosclerosis. No enlarged abdominal or pelvic lymph nodes.  Reproductive: Multilobular uterus with some exophytic areas consistent with multiple fibroids. The exophytic left-sided fibroid is more calcified today than 2016. Again some of these  are pedunculated. There is a soft tissue nodule anterior to the left lateral fibroid on series 2, image 66 measuring 2.9 cm. This could represent an ovarian solid mass recommend further evaluation as this is new from prior.  Other: Small fat containing umbilical hernia with slight rectus muscle diastasis. No free intra-abdominal air. Trace pelvic free fluid. Nonspecific  Musculoskeletal: No acute or significant osseous findings.  IMPRESSION: Mild edema and surrounding stranding involving the area of the pancreatic head and second portion duodenal. With history this could represent interstitial pancreatitis. No fluid collections are identified. Preserved spleen and splenic vein. Overall preserved pancreatic enhancement. Recommend follow-up.  Fatty liver infiltration.  Gallbladder is nondilated.  Multiple uterine fibroids some of which are pedunculated. However there is new 2.9 cm soft tissue nodule in the left adnexa which could be ovarian. Possible solid ovarian lesion. Recommend correlation any known history or further workup such as pelvic ultrasound or female pelvic MRI with and without contrast when appropriate.  7 mm mid body pancreatic cystic lesion. Based on appearance and patient's age, recommend follow up imaging in 1 year.   Electronically Signed By: Karen Kays M.D. On: 07/06/2022 12:12

## 2022-08-02 LAB — CA 125: Cancer Antigen (CA) 125: 29 U/mL (ref 0.0–38.1)

## 2022-08-03 NOTE — Telephone Encounter (Signed)
Called [employer] again and found out that procedures have been approved

## 2022-08-04 LAB — CYTOLOGY - PAP
Comment: NEGATIVE
Comment: NEGATIVE
Comment: NEGATIVE
HPV 16: NEGATIVE
HPV 18 / 45: POSITIVE — AB
High risk HPV: POSITIVE — AB

## 2022-08-07 ENCOUNTER — Telehealth: Payer: Self-pay | Admitting: Psychiatry

## 2022-08-07 ENCOUNTER — Telehealth: Payer: Self-pay | Admitting: *Deleted

## 2022-08-07 NOTE — Addendum Note (Signed)
Addended by: Warner Mccreedy D on: 08/07/2022 12:14 PM   Modules accepted: Orders

## 2022-08-07 NOTE — Telephone Encounter (Signed)
Patient rescheduled for a MRI on 7/11 at 330 pm at [facility]. Patient given date/time/instructions and address

## 2022-08-07 NOTE — Telephone Encounter (Signed)
this is the auth # for her [employer]: [id number] and this is for her [employer] [id number]

## 2022-08-07 NOTE — Telephone Encounter (Signed)
Called pt regarding pap smear (LSIL, HPV HR 18/45+). Plan for pt to have a colpo at her follow-up visit. Pt aware and in agreement.

## 2022-08-07 NOTE — Telephone Encounter (Signed)
Inbound call from patient states her insurance will not cover MRI procedure. Please advise.  Thank you

## 2022-08-07 NOTE — Telephone Encounter (Signed)
Pateint has been made aware that we do have approval and she should be able to proceed

## 2022-08-08 ENCOUNTER — Ambulatory Visit (HOSPITAL_COMMUNITY): Payer: PRIVATE HEALTH INSURANCE

## 2022-08-08 ENCOUNTER — Ambulatory Visit (HOSPITAL_COMMUNITY)
Admission: RE | Admit: 2022-08-08 | Discharge: 2022-08-08 | Disposition: A | Discharge status: Home / Self Care | Payer: No Typology Code available for payment source | Source: Ambulatory Visit | Attending: Internal Medicine | Admitting: Internal Medicine

## 2022-08-08 ENCOUNTER — Other Ambulatory Visit: Payer: Self-pay | Admitting: Internal Medicine

## 2022-08-08 DIAGNOSIS — R1013 Epigastric pain: Secondary | ICD-10-CM | POA: Diagnosis present

## 2022-08-08 DIAGNOSIS — E278 Other specified disorders of adrenal gland: Secondary | ICD-10-CM | POA: Diagnosis not present

## 2022-08-08 DIAGNOSIS — K8689 Other specified diseases of pancreas: Secondary | ICD-10-CM | POA: Diagnosis not present

## 2022-08-08 DIAGNOSIS — D259 Leiomyoma of uterus, unspecified: Secondary | ICD-10-CM | POA: Diagnosis not present

## 2022-08-08 DIAGNOSIS — N281 Cyst of kidney, acquired: Secondary | ICD-10-CM | POA: Diagnosis not present

## 2022-08-08 MED ORDER — GADOBUTROL 1 MMOL/ML IV SOLN
7.0000 mL | Freq: Once | INTRAVENOUS | Status: AC | PRN
  Administered 2022-08-08: 7 mL via INTRAVENOUS

## 2022-08-09 ENCOUNTER — Ambulatory Visit (HOSPITAL_COMMUNITY): Payer: PRIVATE HEALTH INSURANCE

## 2022-08-11 NOTE — Telephone Encounter (Signed)
Appointment has been made for 08-17-2022 @330pm  tt

## 2022-08-14 ENCOUNTER — Ambulatory Visit: Payer: PRIVATE HEALTH INSURANCE

## 2022-08-14 ENCOUNTER — Ambulatory Visit: Payer: PRIVATE HEALTH INSURANCE | Admitting: Psychiatry

## 2022-08-14 DIAGNOSIS — H5213 Myopia, bilateral: Secondary | ICD-10-CM | POA: Diagnosis not present

## 2022-08-15 ENCOUNTER — Other Ambulatory Visit: Payer: Self-pay

## 2022-08-15 DIAGNOSIS — Z1231 Encounter for screening mammogram for malignant neoplasm of breast: Secondary | ICD-10-CM

## 2022-08-17 ENCOUNTER — Ambulatory Visit
Admission: RE | Admit: 2022-08-17 | Discharge: 2022-08-17 | Disposition: A | Discharge status: Home / Self Care | Payer: PRIVATE HEALTH INSURANCE | Source: Ambulatory Visit | Attending: Gynecologic Oncology | Admitting: Gynecologic Oncology

## 2022-08-17 DIAGNOSIS — R1909 Other intra-abdominal and pelvic swelling, mass and lump: Secondary | ICD-10-CM

## 2022-08-17 MED ORDER — GADOPICLENOL 0.5 MMOL/ML IV SOLN
7.0000 mL | Freq: Once | INTRAVENOUS | Status: AC | PRN
  Administered 2022-08-17: 7 mL via INTRAVENOUS

## 2022-08-18 ENCOUNTER — Encounter: Payer: Self-pay | Admitting: Psychiatry

## 2022-08-21 ENCOUNTER — Other Ambulatory Visit: Payer: Self-pay

## 2022-08-21 ENCOUNTER — Inpatient Hospital Stay: Payer: No Typology Code available for payment source | Attending: Psychiatry | Admitting: Psychiatry

## 2022-08-21 ENCOUNTER — Inpatient Hospital Stay (HOSPITAL_BASED_OUTPATIENT_CLINIC_OR_DEPARTMENT_OTHER): Payer: No Typology Code available for payment source | Admitting: Gynecologic Oncology

## 2022-08-21 VITALS — BP 123/80 | HR 70 | Temp 98.7°F | Resp 17 | Wt 155.6 lb

## 2022-08-21 DIAGNOSIS — R1900 Intra-abdominal and pelvic swelling, mass and lump, unspecified site: Secondary | ICD-10-CM

## 2022-08-21 DIAGNOSIS — R87612 Low grade squamous intraepithelial lesion on cytologic smear of cervix (LGSIL): Secondary | ICD-10-CM | POA: Insufficient documentation

## 2022-08-21 DIAGNOSIS — R1909 Other intra-abdominal and pelvic swelling, mass and lump: Secondary | ICD-10-CM

## 2022-08-21 DIAGNOSIS — F17210 Nicotine dependence, cigarettes, uncomplicated: Secondary | ICD-10-CM | POA: Diagnosis not present

## 2022-08-21 DIAGNOSIS — R87618 Other abnormal cytological findings on specimens from cervix uteri: Secondary | ICD-10-CM | POA: Insufficient documentation

## 2022-08-21 DIAGNOSIS — D259 Leiomyoma of uterus, unspecified: Secondary | ICD-10-CM | POA: Insufficient documentation

## 2022-08-21 DIAGNOSIS — F1011 Alcohol abuse, in remission: Secondary | ICD-10-CM | POA: Diagnosis not present

## 2022-08-21 DIAGNOSIS — Z7189 Other specified counseling: Secondary | ICD-10-CM

## 2022-08-21 DIAGNOSIS — I10 Essential (primary) hypertension: Secondary | ICD-10-CM | POA: Diagnosis not present

## 2022-08-21 DIAGNOSIS — D398 Neoplasm of uncertain behavior of other specified female genital organs: Secondary | ICD-10-CM | POA: Insufficient documentation

## 2022-08-21 DIAGNOSIS — Z79899 Other long term (current) drug therapy: Secondary | ICD-10-CM | POA: Diagnosis not present

## 2022-08-21 MED ORDER — IBUPROFEN 800 MG PO TABS
800.0000 mg | ORAL_TABLET | Freq: Three times a day (TID) | ORAL | 0 refills | Status: DC | PRN
Start: 2022-08-21 — End: 2022-09-06

## 2022-08-21 MED ORDER — OXYCODONE HCL 5 MG PO TABS: 5.0000 mg | ORAL_TABLET | ORAL | 0 refills | Status: DC | PRN

## 2022-08-21 MED ORDER — SENNOSIDES-DOCUSATE SODIUM 8.6-50 MG PO TABS
2.0000 | ORAL_TABLET | Freq: Every day | ORAL | 0 refills | Status: AC
Start: 2022-08-21 — End: ?

## 2022-08-21 NOTE — Progress Notes (Unsigned)
Gynecologic Oncology Return Clinic Visit  Date of Service: 08/21/2022 Referring Provider: Vivien Rossetti, MD   Assessment & Plan: Evelyn Crawford is a 62 y.o. woman with solid left adnexal lesion incidentally identified at time of CT scan for upper abdominal pain, pancreatitis, MRI possibly suggestive of a exophytic fibroid versus soft tissue adnexal mass.   Reviewed normal tumor markers.  Reviewed MRI results.  Discussed that other fibroids are identified in her uterus, the solid left adnexal lesion could reasonably be an exophytic fibroid.  However, this is felt to be new compared to 2016.  Given still the ambiguity regarding this lesion as well as patient's pelvic pain and bulk symptoms from her fibroid uterus, reviewed that I think it is reasonable to proceed with definitive surgical management.  Additionally, reviewed her recent Pap smear results which showed an L SIL Pap with HPV 18/45 positive.  Colposcopy performed today.  Will follow-up results.  Patient was consented for: Robotic assisted total laparoscopic hysterectomy, bilateral salpingo-oophorectomy, possible staging, possible laparotomy on 08/29/22.  In the event of malignancy or borderline tumor on frozen section if the mass is an adnexal mass, we will perform indicated staging procedures. We discussed that these procedures may include omentectomy pelvic and/or para-aortic lymphadenectomy, peritoneal biopsies. We would also remove any tissue concerning for metastatic disease which could require additional procedures including bowel surgery.  The risks of surgery were discussed in detail and she understands these to including but not limited to bleeding requiring a blood transfusion, infection, injury to adjacent organs (including but not limited to the bowels, bladder, ureters, nerves, blood vessels), thromboembolic events, wound separation, hernia, vaginal cuff separation, possible risk of lymphedema and lymphocyst if lymphadenectomy  performed, unforseen complication, and possible need for re-exploration.  If the patient experiences any of these events, she understands that her hospitalization or recovery may be prolonged and that she may need to take additional medications for a prolonged period. The patient will receive DVT and antibiotic prophylaxis as indicated. She voiced a clear understanding. She had the opportunity to ask questions and informed consent was obtained today. She wishes to proceed.  She does not require preoperative clearance. Her METs are >4.  Patient continues to be alcohol free since her prior diagnosis of pancreatitis. All preoperative instructions were reviewed. Postoperative expectations were also reviewed. Written handouts were provided to the patient.   RTC postop.  Clide Cliff, MD Gynecologic Oncology   Medical Decision Making I personally spent  TOTAL 30 minutes face-to-face and non-face-to-face in the care of this patient, which includes all pre, intra, and post visit time on the date of service.   ----------------------- Reason for Visit: Follow-up, treatment counseling   Interval History: Since her last visit, patient underwent an MRI pelvis and tumor markers.  No new concerns today.  Past Medical/Surgical History: Past Medical History:  Diagnosis Date   Alcohol abuse    Hypertension    Iron deficiency anemia    Thrombocytopenia (HCC)    Tobacco abuse    Uterine fibroid     Past Surgical History:  Procedure Laterality Date   CESAREAN SECTION     3X    Family History  Problem Relation Age of Onset   Fibroids Mother    Fibroids Sister    Colon cancer Neg Hx    Stomach cancer Neg Hx    Esophageal cancer Neg Hx    Breast cancer Neg Hx    Ovarian cancer Neg Hx    Endometrial cancer Neg  Hx     Social History   Socioeconomic History   Marital status: Single    Spouse name: Not on file   Number of children: 0   Years of education: Not on file   Highest  education level: Not on file  Occupational History   Occupation: Biomedical scientist  Tobacco Use   Smoking status: Every Day    Current packs/day: 0.25    Average packs/day: 0.3 packs/day for 23.0 years (5.8 ttl pk-yrs)    Types: Cigarettes   Smokeless tobacco: Never  Vaping Use   Vaping status: Never Used  Substance and Sexual Activity   Alcohol use: Yes    Comment: daily 24 ounce beer every night drink liquor on weekend   Drug use: No   Sexual activity: Yes    Birth control/protection: Post-menopausal  Other Topics Concern   Not on file  Social History Narrative   Patient needs to submit further paperwork to complete   [name]  November 29, 2009 11:51 AM      Unemployed   Previously worked at a school for 11 years, 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  Lives with grown daughter.  Pt is single, sexually active with men only.          Social Determinants of Health   Financial Resource Strain: Low Risk  (07/25/2022)   Received from [facility]   Overall Financial Resource Strain (CARDIA)    Difficulty of Paying Living Expenses: Not hard at all  Food Insecurity: No Food Insecurity (07/25/2022)   Received from [facility]   Hunger Vital Sign    Worried About Running Out of Food in the Last Year: Never true    Ran Out of Food in the Last Year: Never true  Transportation Needs: No Transportation Needs (07/25/2022)   Received from [facility] - Transportation    Lack of Transportation (Medical): No    Lack of Transportation (Non-Medical): No  Physical Activity: Not on file  Stress: No Stress Concern Present (08/10/2020)   Received from [facility] - Occupational Stress Questionnaire    Feeling of Stress : Not at all  Social Connections: Unknown (06/07/2021)   Received from [facility]   Social Network    Social Network: Not on file    Current Medications:  Current Outpatient Medications:    atorvastatin  (LIPITOR) 20 MG tablet, Take 1 tablet (20 mg total) by mouth daily., Disp: 90 tablet, Rfl: 1   hydrochlorothiazide (HYDRODIURIL) 25 MG tablet, Take 1 tablet (25 mg total) by mouth daily., Disp: 90 tablet, Rfl: 1   ibuprofen (ADVIL) 800 MG tablet, Take 1 tablet (800 mg total) by mouth every 8 (eight) hours as needed for moderate pain. For AFTER surgery only, Disp: 30 tablet, Rfl: 0   oxyCODONE (OXY IR/ROXICODONE) 5 MG immediate release tablet, Take 1 tablet (5 mg total) by mouth every 4 (four) hours as needed for severe pain. For AFTER surgery only, do not take and drive, Disp: 10 tablet, Rfl: 0   senna-docusate (SENOKOT-S) 8.6-50 MG tablet, Take 2 tablets by mouth at bedtime. For AFTER surgery, do not take if having diarrhea, Disp: 30 tablet, Rfl: 0   Multiple Vitamin (MULTIVITAMIN WITH MINERALS) TABS tablet, Take 2 tablets by mouth daily., Disp: , Rfl:   Review of Symptoms: Complete 10-system review is negative except as above in Interval History.  Physical Exam: BP 123/80 (BP  Location: Left Arm, Patient Position: Sitting)   Pulse 70   Temp 98.7 F (37.1 C) (Oral)   Resp 17   Wt 155 lb 9.6 oz (70.6 kg)   LMP 09/17/2010   SpO2 100%   BMI 25.19 kg/m  General: Alert, oriented, no acute distress. HEENT: Normocephalic, atraumatic. Neck symmetric without masses. Sclera anicteric.  Chest: Normal work of breathing. Clear to auscultation bilaterally.   Cardiovascular: Regular rate and rhythm, no murmurs. Abdomen: Soft, nontender.   Extremities: Grossly normal range of motion.  Warm, well perfused.  No edema bilaterally. Skin: No rashes or lesions noted. GU: Normal appearing external genitalia without erythema, excoriation, or lesions.  Speculum exam reveals normal appearing cervix, deviated to the right. Exam chaperoned by Warner Mccreedy, NP   COLPOSCOPY PROCEDURE NOTE  Procedure Details: After appropriate verbal informed consent was obtained, a timeout was performed. A sterile speculum was  placed in the vagina. Acetic acid was applied to the cervix with the findings as noted below. The cervix was then cleaned with betadine x3. A single-tooth tenaculum was placed on the anterior lip of the cervix. A biopsy was obtained at 12:00 with a Tischler biopsy forceps, and an ECC was performed in the usual fashion.  Silver nitrite was applied with hemostasis achieved. The tenaculum was removed, and tenaculum sites were noted to be hemostatic. The speculum was removed from the vagina. The patient tolerated the procedure well.   Adequate Exam: Transformation zone not visualized  Biopsy Specimen: 12 o'clock, ECC  Condition: Stable. Patient tolerated procedure well.  Complications: None  Findings: Punctate red area at 12 o'clock, no acetowhite changes otherwise   Colposcopic Impression: CIN1   Laboratory & Radiologic Studies: Lab Results  Component Value Date   CAN125 29.0 07/31/2022   CEA 3.93 07/31/2022   MR Pelvis W Wo Contrast 08/17/2022  Narrative CLINICAL DATA:  Left adnexal mass on recent CT.  EXAM: MRI PELVIS WITHOUT AND WITH CONTRAST  TECHNIQUE: Multiplanar multisequence MR imaging of the pelvis was performed both before and after administration of intravenous contrast.  CONTRAST:  7 cc Vueway  COMPARISON:  CT scan 07/06/2022  FINDINGS: Urinary Tract: Unremarkable.  Bowel: Unremarkable pelvic bowel loops.  Vascular/Lymphatic: No pathologically enlarged lymph nodes or other significant abnormality.  Reproductive:  Uterus: Measures 7.8 x 10.2 x 9.4 cm. Multiple uterine fibroids identified including submucosal, intramural, and subserosal/pedunculated lesions. Multiple submucosal fibroids vary in size from 5 mm up to about 18 mm (well demonstrated on sagittal T2 image 10 of series 3). A dominant left uterine body subserosal fibroid measures 5.3 x 5.5 x 5.2 cm. Right-sided fundal intramural fibroid measures up to 3.1 cm with a posterior fundal  subserosal exophytic 3.1 cm fibroid evident. 2.6 x 3.0 x 2.6 cm anterior exophytic right fundal fibroid extends into the anterior right adnexal space.  Left-sided adnexal lesion of concern measures 3.3 x 2.9 x 2.9 cm. This generally follows signal characteristics of multiple other uterine fibroids and as such is felt to represent an exophytic fibroid lesion. However the left gonadal vasculature tracks down to the posterior margin of this lesion and no normal left ovarian parenchyma can be identified.  Right ovary: Not discernible.  Left ovary:  Not discernible.  Other: Trace free fluid is identified in the right pelvis.  Musculoskeletal: No focal suspicious marrow enhancement within the visualized bony anatomy.  IMPRESSION: 1. 3.3 x 2.9 x 2.9 cm left adnexal lesion of concern is felt to represent an exophytic fibroid lesion. However, the  left gonadal vasculature tracks down to the posterior margin of this lesion and no normal left ovarian parenchyma can be identified. As such, left adnexal soft tissue mass cannot be excluded. Pelvic ultrasound may prove helpful to further evaluate. Follow-up recommended to ensure stability. 2. Multiple other uterine fibroids identified including submucosal, intramural, and subserosal/pedunculated lesions. Patient also apparently has an exophytic fibroid extending into the right adnexal space. Attention on follow-up recommended. 3. Trace free fluid in the right pelvis.   Electronically Signed By: Kennith Center M.D. On: 08/21/2022 09:56

## 2022-08-21 NOTE — Patient Instructions (Addendum)
Preparing for your Surgery  Plan for surgery on August 29, 2022 with Dr. Clide Cliff at [facility]. You will be scheduled for robotic assisted total laparoscopic hysterectomy (removal of the uterus and cervix), bilateral salpingo-oophorectomy (removal of both ovaries and fallopian tubes), possible staging if a precancer or cancer is identified, possible laparotomy (larger incision on your abdomen if needed).   Pre-operative Testing -You will receive a phone call from presurgical testing at [facility] to arrange for a pre-operative appointment and lab work.  -Bring your insurance card, copy of an advanced directive if applicable, medication list  -At that visit, you will be asked to sign a consent for a possible blood transfusion in case a transfusion becomes necessary during surgery.  The need for a blood transfusion is rare but having consent is a necessary part of your care.     -You should not be taking blood thinners or aspirin at least ten days prior to surgery unless instructed by your surgeon.  -Do not take supplements such as fish oil (omega 3), red yeast rice, turmeric before your surgery. You want to avoid medications with aspirin in them including headache powders such as BC or Goody's), Excedrin migraine.  Day Before Surgery at Home -You will be asked to take in a light diet the day before surgery. You will be advised you can have clear liquids up until 3 hours before your surgery.    Eat a light diet the day before surgery.  Examples including soups, broths, toast, yogurt, mashed potatoes.  AVOID GAS PRODUCING FOODS AND BEVERAGES. Things to avoid include carbonated beverages (fizzy beverages, sodas), raw fruits and raw vegetables (uncooked), or beans.   If your bowels are filled with gas, your surgeon will have difficulty visualizing your pelvic organs which increases your surgical risks.  Your role in recovery Your role is to become active as soon as  directed by your doctor, while still giving yourself time to heal.  Rest when you feel tired. You will be asked to do the following in order to speed your recovery:  - Cough and breathe deeply. This helps to clear and expand your lungs and can prevent pneumonia after surgery.  - STAY ACTIVE WHEN YOU GET HOME. Do mild physical activity. Walking or moving your legs help your circulation and body functions return to normal. Do not try to get up or walk alone the first time after surgery.   -If you develop swelling on one leg or the other, pain in the back of your leg, redness/warmth in one of your legs, please call the office or go to the Emergency Room to have a doppler to rule out a blood clot. For shortness of breath, chest pain-seek care in the Emergency Room as soon as possible. - Actively manage your pain. Managing your pain lets you move in comfort. We will ask you to rate your pain on a scale of zero to 10. It is your responsibility to tell your doctor or nurse where and how much you hurt so your pain can be treated.  Special Considerations -If you are diabetic, you may be placed on insulin after surgery to have closer control over your blood sugars to promote healing and recovery.  This does not mean that you will be discharged on insulin.  If applicable, your oral antidiabetics will be resumed when you are tolerating a solid diet.  -Your final pathology results from surgery should be available around one week after surgery and  the results will be relayed to you when available.  -Dr. Antionette Char is the surgeon that assists your GYN Oncologist with surgery.  If you end up staying the night, the next day after your surgery you will either see Dr. Pricilla Holm, Dr. Alvester Morin, or Dr. Antionette Char.  -FMLA forms can be faxed to [phone] and please allow 5-7 business days for completion.  Pain Management After Surgery -You have been prescribed your pain medication and bowel regimen  medications before surgery so that you can have these available when you are discharged from the hospital. The pain medication is for use ONLY AFTER surgery and a new prescription will not be given.   -Make sure that you have Tylenol and Ibuprofen IF YOU ARE ABLE TO TAKE THESE MEDICATIONS at home to use on a regular basis after surgery for pain control. We recommend alternating the medications every hour to six hours since they work differently and are processed in the body differently for pain relief.  -Review the attached handout on narcotic use and their risks and side effects.   Bowel Regimen -You have been prescribed Sennakot-S to take nightly to prevent constipation especially if you are taking the narcotic pain medication intermittently.  It is important to prevent constipation and drink adequate amounts of liquids. You can stop taking this medication when you are not taking pain medication and you are back on your normal bowel routine.  Risks of Surgery Risks of surgery are low but include bleeding, infection, damage to surrounding structures, re-operation, blood clots, and very rarely death.   Blood Transfusion Information (For the consent to be signed before surgery)  We will be checking your blood type before surgery so in case of emergencies, we will know what type of blood you would need.                                            WHAT IS A BLOOD TRANSFUSION?  A transfusion is the replacement of blood or some of its parts. Blood is made up of multiple cells which provide different functions. Red blood cells carry oxygen and are used for blood loss replacement. White blood cells fight against infection. Platelets control bleeding. Plasma helps clot blood. Other blood products are available for specialized needs, such as hemophilia or other clotting disorders. BEFORE THE TRANSFUSION  Who gives blood for transfusions?  You may be able to donate blood to be used at a later date on  yourself (autologous donation). Relatives can be asked to donate blood. This is generally not any safer than if you have received blood from a stranger. The same precautions are taken to ensure safety when a relative's blood is donated. Healthy volunteers who are fully evaluated to make sure their blood is safe. This is blood bank blood. Transfusion therapy is the safest it has ever been in the practice of medicine. Before blood is taken from a donor, a complete history is taken to make sure that person has no history of diseases nor engages in risky social behavior (examples are intravenous drug use or sexual activity with multiple partners). The donor's travel history is screened to minimize risk of transmitting infections, such as malaria. The donated blood is tested for signs of infectious diseases, such as HIV and hepatitis. The blood is then tested to be sure it is compatible with you in order to  minimize the chance of a transfusion reaction. If you or a relative donates blood, this is often done in anticipation of surgery and is not appropriate for emergency situations. It takes many days to process the donated blood. RISKS AND COMPLICATIONS Although transfusion therapy is very safe and saves many lives, the main dangers of transfusion include:  Getting an infectious disease. Developing a transfusion reaction. This is an allergic reaction to something in the blood you were given. Every precaution is taken to prevent this. The decision to have a blood transfusion has been considered carefully by your caregiver before blood is given. Blood is not given unless the benefits outweigh the risks.  AFTER SURGERY INSTRUCTIONS  Return to work: 4-6 weeks if applicable  Activity: 1. Be up and out of the bed during the day.  Take a nap if needed.  You may walk up steps but be careful and use the hand rail.  Stair climbing will tire you more than you think, you may need to stop part way and rest.   2. No  lifting or straining for 6 weeks over 10 pounds. No pushing, pulling, straining for 6 weeks.  3. No driving for around 1 week(s).  Do not drive if you are taking narcotic pain medicine and make sure that your reaction time has returned.   4. You can shower as soon as the next day after surgery. Shower daily.  Use your regular soap and water (not directly on the incision) and pat your incision(s) dry afterwards; don't rub.  No tub baths or submerging your body in water until cleared by your surgeon. If you have the soap that was given to you by pre-surgical testing that was used before surgery, you do not need to use it afterwards because this can irritate your incisions.   5. No sexual activity and nothing in the vagina for 12 weeks.  6. You may experience a small amount of clear drainage from your incisions, which is normal.  If the drainage persists, increases, or changes color please call the office.  7. Do not use creams, lotions, or ointments such as neosporin on your incisions after surgery until advised by your surgeon because they can cause removal of the dermabond glue on your incisions.    8. You may experience vaginal spotting after surgery or when the stitches at the top of the vagina begin to dissolve.  The spotting is normal but if you experience heavy bleeding, call our office.  9. Take Tylenol or ibuprofen first for pain if you are able to take these medications and only use narcotic pain medication for severe pain not relieved by the Tylenol or Ibuprofen.  Monitor your Tylenol intake to a max of 4,000 mg in a 24 hour period. You can alternate these medications after surgery.  Diet: 1. Low sodium Heart Healthy Diet is recommended but you are cleared to resume your normal (before surgery) diet after your procedure.  2. It is safe to use a laxative, such as Miralax or Colace, if you have difficulty moving your bowels. You have been prescribed Sennakot-S to take at bedtime every  evening after surgery to keep bowel movements regular and to prevent constipation.    Wound Care: 1. Keep clean and dry.  Shower daily.  Reasons to call the Doctor: Fever - Oral temperature greater than 100.4 degrees Fahrenheit Foul-smelling vaginal discharge Difficulty urinating Nausea and vomiting Increased pain at the site of the incision that is unrelieved with pain medicine.  Difficulty breathing with or without chest pain New calf pain especially if only on one side Sudden, continuing increased vaginal bleeding with or without clots.   Contacts: For questions or concerns you should contact:  Dr. Clide Cliff at [phone]  Warner Mccreedy, NP at [phone]  After Hours: call [phone] and have the GYN Oncologist paged/contacted (after 5 pm or on the weekends). You will speak with an after hours RN and let he or she know you have had surgery.  Messages sent via mychart are for non-urgent matters and are not responded to after hours so for urgent needs, please call the after hours number.

## 2022-08-21 NOTE — H&P (View-Only) (Signed)
Gynecologic Oncology Return Clinic Visit  Date of Service: 08/21/2022 Referring Provider: Vivien Rossetti, MD   Assessment & Plan: Evelyn Crawford is a 62 y.o. woman with solid left adnexal lesion incidentally identified at time of CT scan for upper abdominal pain, pancreatitis, MRI possibly suggestive of a exophytic fibroid versus soft tissue adnexal mass.   Reviewed normal tumor markers.  Reviewed MRI results.  Discussed that other fibroids are identified in her uterus, the solid left adnexal lesion could reasonably be an exophytic fibroid.  However, this is felt to be new compared to 2016.  Given still the ambiguity regarding this lesion as well as patient's pelvic pain and bulk symptoms from her fibroid uterus, reviewed that I think it is reasonable to proceed with definitive surgical management.  Additionally, reviewed her recent Pap smear results which showed an L SIL Pap with HPV 18/45 positive.  Colposcopy performed today.  Will follow-up results.  Patient was consented for: Robotic assisted total laparoscopic hysterectomy, bilateral salpingo-oophorectomy, possible staging, possible laparotomy on 08/29/22.  In the event of malignancy or borderline tumor on frozen section if the mass is an adnexal mass, we will perform indicated staging procedures. We discussed that these procedures may include omentectomy pelvic and/or para-aortic lymphadenectomy, peritoneal biopsies. We would also remove any tissue concerning for metastatic disease which could require additional procedures including bowel surgery.  The risks of surgery were discussed in detail and she understands these to including but not limited to bleeding requiring a blood transfusion, infection, injury to adjacent organs (including but not limited to the bowels, bladder, ureters, nerves, blood vessels), thromboembolic events, wound separation, hernia, vaginal cuff separation, possible risk of lymphedema and lymphocyst if lymphadenectomy  performed, unforseen complication, and possible need for re-exploration.  If the patient experiences any of these events, she understands that her hospitalization or recovery may be prolonged and that she may need to take additional medications for a prolonged period. The patient will receive DVT and antibiotic prophylaxis as indicated. She voiced a clear understanding. She had the opportunity to ask questions and informed consent was obtained today. She wishes to proceed.  She does not require preoperative clearance. Her METs are >4.  Patient continues to be alcohol free since her prior diagnosis of pancreatitis. All preoperative instructions were reviewed. Postoperative expectations were also reviewed. Written handouts were provided to the patient.   RTC postop.  Clide Cliff, MD Gynecologic Oncology   Medical Decision Making I personally spent  TOTAL 30 minutes face-to-face and non-face-to-face in the care of this patient, which includes all pre, intra, and post visit time on the date of service.   ----------------------- Reason for Visit: Follow-up, treatment counseling   Interval History: Since her last visit, patient underwent an MRI pelvis and tumor markers.  No new concerns today.  Past Medical/Surgical History: Past Medical History:  Diagnosis Date   Alcohol abuse    Hypertension    Iron deficiency anemia    Thrombocytopenia (HCC)    Tobacco abuse    Uterine fibroid     Past Surgical History:  Procedure Laterality Date   CESAREAN SECTION     3X    Family History  Problem Relation Age of Onset   Fibroids Mother    Fibroids Sister    Colon cancer Neg Hx    Stomach cancer Neg Hx    Esophageal cancer Neg Hx    Breast cancer Neg Hx    Ovarian cancer Neg Hx    Endometrial cancer Neg  Hx     Social History   Socioeconomic History   Marital status: Single    Spouse name: Not on file   Number of children: 0   Years of education: Not on file   Highest  education level: Not on file  Occupational History   Occupation: Biomedical scientist  Tobacco Use   Smoking status: Every Day    Current packs/day: 0.25    Average packs/day: 0.3 packs/day for 23.0 years (5.8 ttl pk-yrs)    Types: Cigarettes   Smokeless tobacco: Never  Vaping Use   Vaping status: Never Used  Substance and Sexual Activity   Alcohol use: Yes    Comment: daily 24 ounce beer every night drink liquor on weekend   Drug use: No   Sexual activity: Yes    Birth control/protection: Post-menopausal  Other Topics Concern   Not on file  Social History Narrative   Patient needs to submit further paperwork to complete   [name]  November 29, 2009 11:51 AM      Unemployed   Previously worked at a school for 11 years, 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  Lives with grown daughter.  Pt is single, sexually active with men only.          Social Determinants of Health   Financial Resource Strain: Low Risk  (07/25/2022)   Received from [facility]   Overall Financial Resource Strain (CARDIA)    Difficulty of Paying Living Expenses: Not hard at all  Food Insecurity: No Food Insecurity (07/25/2022)   Received from [facility]   Hunger Vital Sign    Worried About Running Out of Food in the Last Year: Never true    Ran Out of Food in the Last Year: Never true  Transportation Needs: No Transportation Needs (07/25/2022)   Received from [facility] - Transportation    Lack of Transportation (Medical): No    Lack of Transportation (Non-Medical): No  Physical Activity: Not on file  Stress: No Stress Concern Present (08/10/2020)   Received from [facility] - Occupational Stress Questionnaire    Feeling of Stress : Not at all  Social Connections: Unknown (06/07/2021)   Received from [facility]   Social Network    Social Network: Not on file    Current Medications:  Current Outpatient Medications:    atorvastatin  (LIPITOR) 20 MG tablet, Take 1 tablet (20 mg total) by mouth daily., Disp: 90 tablet, Rfl: 1   hydrochlorothiazide (HYDRODIURIL) 25 MG tablet, Take 1 tablet (25 mg total) by mouth daily., Disp: 90 tablet, Rfl: 1   ibuprofen (ADVIL) 800 MG tablet, Take 1 tablet (800 mg total) by mouth every 8 (eight) hours as needed for moderate pain. For AFTER surgery only, Disp: 30 tablet, Rfl: 0   oxyCODONE (OXY IR/ROXICODONE) 5 MG immediate release tablet, Take 1 tablet (5 mg total) by mouth every 4 (four) hours as needed for severe pain. For AFTER surgery only, do not take and drive, Disp: 10 tablet, Rfl: 0   senna-docusate (SENOKOT-S) 8.6-50 MG tablet, Take 2 tablets by mouth at bedtime. For AFTER surgery, do not take if having diarrhea, Disp: 30 tablet, Rfl: 0   Multiple Vitamin (MULTIVITAMIN WITH MINERALS) TABS tablet, Take 2 tablets by mouth daily., Disp: , Rfl:   Review of Symptoms: Complete 10-system review is negative except as above in Interval History.  Physical Exam: BP 123/80 (BP  Location: Left Arm, Patient Position: Sitting)   Pulse 70   Temp 98.7 F (37.1 C) (Oral)   Resp 17   Wt 155 lb 9.6 oz (70.6 kg)   LMP 09/17/2010   SpO2 100%   BMI 25.19 kg/m  General: Alert, oriented, no acute distress. HEENT: Normocephalic, atraumatic. Neck symmetric without masses. Sclera anicteric.  Chest: Normal work of breathing. Clear to auscultation bilaterally.   Cardiovascular: Regular rate and rhythm, no murmurs. Abdomen: Soft, nontender.   Extremities: Grossly normal range of motion.  Warm, well perfused.  No edema bilaterally. Skin: No rashes or lesions noted. GU: Normal appearing external genitalia without erythema, excoriation, or lesions.  Speculum exam reveals normal appearing cervix, deviated to the right. Exam chaperoned by Warner Mccreedy, NP   COLPOSCOPY PROCEDURE NOTE  Procedure Details: After appropriate verbal informed consent was obtained, a timeout was performed. A sterile speculum was  placed in the vagina. Acetic acid was applied to the cervix with the findings as noted below. The cervix was then cleaned with betadine x3. A single-tooth tenaculum was placed on the anterior lip of the cervix. A biopsy was obtained at 12:00 with a Tischler biopsy forceps, and an ECC was performed in the usual fashion.  Silver nitrite was applied with hemostasis achieved. The tenaculum was removed, and tenaculum sites were noted to be hemostatic. The speculum was removed from the vagina. The patient tolerated the procedure well.   Adequate Exam: Transformation zone not visualized  Biopsy Specimen: 12 o'clock, ECC  Condition: Stable. Patient tolerated procedure well.  Complications: None  Findings: Punctate red area at 12 o'clock, no acetowhite changes otherwise   Colposcopic Impression: CIN1   Laboratory & Radiologic Studies: Lab Results  Component Value Date   CAN125 29.0 07/31/2022   CEA 3.93 07/31/2022   MR Pelvis W Wo Contrast 08/17/2022  Narrative CLINICAL DATA:  Left adnexal mass on recent CT.  EXAM: MRI PELVIS WITHOUT AND WITH CONTRAST  TECHNIQUE: Multiplanar multisequence MR imaging of the pelvis was performed both before and after administration of intravenous contrast.  CONTRAST:  7 cc Vueway  COMPARISON:  CT scan 07/06/2022  FINDINGS: Urinary Tract: Unremarkable.  Bowel: Unremarkable pelvic bowel loops.  Vascular/Lymphatic: No pathologically enlarged lymph nodes or other significant abnormality.  Reproductive:  Uterus: Measures 7.8 x 10.2 x 9.4 cm. Multiple uterine fibroids identified including submucosal, intramural, and subserosal/pedunculated lesions. Multiple submucosal fibroids vary in size from 5 mm up to about 18 mm (well demonstrated on sagittal T2 image 10 of series 3). A dominant left uterine body subserosal fibroid measures 5.3 x 5.5 x 5.2 cm. Right-sided fundal intramural fibroid measures up to 3.1 cm with a posterior fundal  subserosal exophytic 3.1 cm fibroid evident. 2.6 x 3.0 x 2.6 cm anterior exophytic right fundal fibroid extends into the anterior right adnexal space.  Left-sided adnexal lesion of concern measures 3.3 x 2.9 x 2.9 cm. This generally follows signal characteristics of multiple other uterine fibroids and as such is felt to represent an exophytic fibroid lesion. However the left gonadal vasculature tracks down to the posterior margin of this lesion and no normal left ovarian parenchyma can be identified.  Right ovary: Not discernible.  Left ovary:  Not discernible.  Other: Trace free fluid is identified in the right pelvis.  Musculoskeletal: No focal suspicious marrow enhancement within the visualized bony anatomy.  IMPRESSION: 1. 3.3 x 2.9 x 2.9 cm left adnexal lesion of concern is felt to represent an exophytic fibroid lesion. However, the  left gonadal vasculature tracks down to the posterior margin of this lesion and no normal left ovarian parenchyma can be identified. As such, left adnexal soft tissue mass cannot be excluded. Pelvic ultrasound may prove helpful to further evaluate. Follow-up recommended to ensure stability. 2. Multiple other uterine fibroids identified including submucosal, intramural, and subserosal/pedunculated lesions. Patient also apparently has an exophytic fibroid extending into the right adnexal space. Attention on follow-up recommended. 3. Trace free fluid in the right pelvis.   Electronically Signed By: Kennith Center M.D. On: 08/21/2022 09:56

## 2022-08-22 ENCOUNTER — Ambulatory Visit: Payer: PRIVATE HEALTH INSURANCE

## 2022-08-22 NOTE — Progress Notes (Unsigned)
 Patient here for new patient consultation with Dr. Ernestina Patches and for a pre-operative appointment prior to her scheduled surgery on April 18, 2022. She is scheduled for mini laparotomy with cyst drainage, robotic assisted laparoscopic bilateral salpingo-oophorectomy, possible total hysterectomy, possible staging. The surgery was discussed in detail.  See after visit summary for additional details.    Discussed post-op pain management in detail including the aspects of the enhanced recovery pathway.  Advised her that a new prescription would be sent in for tramadol and it is only to be used for after her upcoming surgery.  We discussed the use of tylenol post-op and to monitor for a maximum of 4,000 mg in a 24 hour period.  Also prescribed sennakot to be used after surgery and to hold if having loose stools.  Discussed bowel regimen in detail.     Discussed the use of SCDs and measures to take at home to prevent DVT including frequent mobility.  Reportable signs and symptoms of DVT discussed. Post-operative instructions discussed and expectations for after surgery. Incisional care discussed as well including reportable signs and symptoms including erythema, drainage, wound separation.     10 minutes spent with the patient and preparing information.  Verbalizing understanding of material discussed. No needs or concerns voiced at the end of the visit.   Advised patient to call for any needs.  Advised that her post-operative medications had been prescribed and could be picked up at any time.    This appointment is included in the global surgical bundle as pre-operative teaching and has no charge.

## 2022-08-23 ENCOUNTER — Encounter: Payer: Self-pay | Admitting: Psychiatry

## 2022-08-23 LAB — SURGICAL PATHOLOGY

## 2022-08-24 ENCOUNTER — Ambulatory Visit: Payer: PRIVATE HEALTH INSURANCE

## 2022-08-24 ENCOUNTER — Telehealth: Payer: Self-pay

## 2022-08-24 NOTE — Patient Instructions (Signed)
SURGICAL WAITING ROOM VISITATION  Patients having surgery or a procedure may have no more than 2 support people in the waiting area - these visitors may rotate.    Children under the age of 57 must have an adult with them who is not the patient.  Due to an increase in RSV and influenza rates and associated hospitalizations, children ages 25 and under may not visit patients in [facility] hospitals.  If the patient needs to stay at the hospital during part of their recovery, the visitor guidelines for inpatient rooms apply. Pre-op nurse will coordinate an appropriate time for 1 support person to accompany patient in pre-op.  This support person may not rotate.    Please refer to the [facility] website for the visitor guidelines for Inpatients (after your surgery is over and you are in a regular room).    Your procedure is scheduled on: 08/29/22   Report to [facility] Main Entrance    Report to admitting at 11:30 AM   Call this number if you have problems the morning of surgery [phone]   Do not eat food :After Midnight.   After Midnight you may have the following liquids until 10:45 AM DAY OF SURGERY  Water Non-Citrus Juices (without pulp, NO RED-Apple, White grape, White cranberry) Black Coffee (NO MILK/CREAM OR CREAMERS, sugar ok)  Clear Tea (NO MILK/CREAM OR CREAMERS, sugar ok) regular and decaf                             Plain Jell-O (NO RED)                                           Fruit ices (not with fruit pulp, NO RED)                                     Popsicles (NO RED)                                                               Sports drinks like Gatorade (NO RED)          If you have questions, please contact your surgeon's office.   FOLLOW BOWEL PREP AND ANY ADDITIONAL PRE OP INSTRUCTIONS YOU RECEIVED FROM YOUR SURGEON'S OFFICE!!!     Oral Hygiene is also important to reduce your risk of infection.                                    Remember -  BRUSH YOUR TEETH THE MORNING OF SURGERY WITH YOUR REGULAR TOOTHPASTE  DENTURES WILL BE REMOVED PRIOR TO SURGERY PLEASE DO NOT APPLY "Poly grip" OR ADHESIVES!!!   Do NOT smoke after Midnight   Take these medicines the morning of surgery with A SIP OF WATER: Atorvastatin   DO NOT TAKE ANY ORAL DIABETIC MEDICATIONS DAY OF YOUR SURGERY  Bring CPAP mask and tubing day of surgery.  You may not have any metal on your body including hair pins, jewelry, and body piercing             Do not wear make-up, lotions, powders, perfumes, or deodorant  Do not wear nail polish including gel and S&S, artificial/acrylic nails, or any other type of covering on natural nails including finger and toenails. If you have artificial nails, gel coating, etc. that needs to be removed by a nail salon please have this removed prior to surgery or surgery may need to be canceled/ delayed if the surgeon/ anesthesia feels like they are unable to be safely monitored.   Do not shave  48 hours prior to surgery.    Do not bring valuables to the hospital. [facility] IS NOT             RESPONSIBLE   FOR VALUABLES.   Contacts, glasses, dentures or bridgework may not be worn into surgery.  DO NOT BRING YOUR HOME MEDICATIONS TO THE HOSPITAL. PHARMACY WILL DISPENSE MEDICATIONS LISTED ON YOUR MEDICATION LIST TO YOU DURING YOUR ADMISSION IN THE HOSPITAL!    Patients discharged on the day of surgery will not be allowed to drive home.  Someone NEEDS to stay with you for the first 24 hours after anesthesia.   Special Instructions: Bring a copy of your healthcare power of attorney and living will documents the day of surgery if you haven't scanned them before.              Please read over the following fact sheets you were given: IF YOU HAVE QUESTIONS ABOUT YOUR PRE-OP INSTRUCTIONS PLEASE CALL [name]    If you received a COVID test during your pre-op visit  it is requested that you wear a  mask when out in public, stay away from anyone that may not be feeling well and notify your surgeon if you develop symptoms. If you test positive for Covid or have been in contact with anyone that has tested positive in the last 10 days please notify you surgeon.    [facility] - Preparing for Surgery Before surgery, you can play an important role.  Because skin is not sterile, your skin needs to be as free of germs as possible.  You can reduce the number of germs on your skin by washing with CHG (chlorahexidine gluconate) soap before surgery.  CHG is an antiseptic cleaner which kills germs and bonds with the skin to continue killing germs even after washing. Please DO NOT use if you have an allergy to CHG or antibacterial soaps.  If your skin becomes reddened/irritated stop using the CHG and inform your nurse when you arrive at Short Stay. Do not shave (including legs and underarms) for at least 48 hours prior to the first CHG shower.  You may shave your face/neck.  Please follow these instructions carefully:  1.  Shower with CHG Soap the night before surgery and the  morning of surgery.  2.  If you choose to wash your hair, wash your hair first as usual with your normal  shampoo.  3.  After you shampoo, rinse your hair and body thoroughly to remove the shampoo.                             4.  Use CHG as you would any other liquid soap.  You can apply chg directly to the skin and wash.  Gently with a  scrungie or clean washcloth.  5.  Apply the CHG Soap to your body ONLY FROM THE NECK DOWN.   Do   not use on face/ open                           Wound or open sores. Avoid contact with eyes, ears mouth and   genitals (private parts).                       Wash face,  Genitals (private parts) with your normal soap.             6.  Wash thoroughly, paying special attention to the area where your    surgery  will be performed.  7.  Thoroughly rinse your body with warm water from the neck down.  8.  DO  NOT shower/wash with your normal soap after using and rinsing off the CHG Soap.                9.  Pat yourself dry with a clean towel.            10.  Wear clean pajamas.            11.  Place clean sheets on your bed the night of your first shower and do not  sleep with pets. Day of Surgery : Do not apply any lotions/deodorants the morning of surgery.  Please wear clean clothes to the hospital/surgery center.  FAILURE TO FOLLOW THESE INSTRUCTIONS MAY RESULT IN THE CANCELLATION OF YOUR SURGERY  PATIENT SIGNATURE_________________________________  NURSE SIGNATURE__________________________________  ________________________________________________________________________ WHAT IS A BLOOD TRANSFUSION? Blood Transfusion Information  A transfusion is the replacement of blood or some of its parts. Blood is made up of multiple cells which provide different functions. Red blood cells carry oxygen and are used for blood loss replacement. White blood cells fight against infection. Platelets control bleeding. Plasma helps clot blood. Other blood products are available for specialized needs, such as hemophilia or other clotting disorders. BEFORE THE TRANSFUSION  Who gives blood for transfusions?  Healthy volunteers who are fully evaluated to make sure their blood is safe. This is blood bank blood. Transfusion therapy is the safest it has ever been in the practice of medicine. Before blood is taken from a donor, a complete history is taken to make sure that person has no history of diseases nor engages in risky social behavior (examples are intravenous drug use or sexual activity with multiple partners). The donor's travel history is screened to minimize risk of transmitting infections, such as malaria. The donated blood is tested for signs of infectious diseases, such as HIV and hepatitis. The blood is then tested to be sure it is compatible with you in order to minimize the chance of a transfusion  reaction. If you or a relative donates blood, this is often done in anticipation of surgery and is not appropriate for emergency situations. It takes many days to process the donated blood. RISKS AND COMPLICATIONS Although transfusion therapy is very safe and saves many lives, the main dangers of transfusion include:  Getting an infectious disease. Developing a transfusion reaction. This is an allergic reaction to something in the blood you were given. Every precaution is taken to prevent this. The decision to have a blood transfusion has been considered carefully by your caregiver before blood is given. Blood is not given unless the benefits outweigh the risks. AFTER THE  TRANSFUSION Right after receiving a blood transfusion, you will usually feel much better and more energetic. This is especially true if your red blood cells have gotten low (anemic). The transfusion raises the level of the red blood cells which carry oxygen, and this usually causes an energy increase. The nurse administering the transfusion will monitor you carefully for complications. HOME CARE INSTRUCTIONS  No special instructions are needed after a transfusion. You may find your energy is better. Speak with your caregiver about any limitations on activity for underlying diseases you may have. SEEK MEDICAL CARE IF:  Your condition is not improving after your transfusion. You develop redness or irritation at the intravenous (IV) site. SEEK IMMEDIATE MEDICAL CARE IF:  Any of the following symptoms occur over the next 12 hours: Shaking chills. You have a temperature by mouth above 102 F (38.9 C), not controlled by medicine. Chest, back, or muscle pain. People around you feel you are not acting correctly or are confused. Shortness of breath or difficulty breathing. Dizziness and fainting. You get a rash or develop hives. You have a decrease in urine output. Your urine turns a dark color or changes to pink, red, or  brown. Any of the following symptoms occur over the next 10 days: You have a temperature by mouth above 102 F (38.9 C), not controlled by medicine. Shortness of breath. Weakness after normal activity. The white part of the eye turns yellow (jaundice). You have a decrease in the amount of urine or are urinating less often. Your urine turns a dark color or changes to pink, red, or brown. Document Released: 01/21/2000 Document Revised: 04/17/2011 Document Reviewed: 09/09/2007 [facility] Patient Information 2014 [place], [place].  _______________________________________________________________________

## 2022-08-24 NOTE — Progress Notes (Signed)
COVID Vaccine Completed:  Date of COVID positive in last 90 days:  PCP - Bertram Denver, NP Cardiologist -   Chest x-ray -  EKG - 07/06/22 Epic Stress Test -  ECHO -  Cardiac Cath -  Pacemaker/ICD device last checked: Spinal Cord Stimulator:  Bowel Prep - light diet day before  Sleep Study -  CPAP -   Fasting Blood Sugar -  Checks Blood Sugar _____ times a day  Last dose of GLP1 agonist-  N/A GLP1 instructions:  N/A   Last dose of SGLT-2 inhibitors-  N/A SGLT-2 instructions: N/A   Blood Thinner Instructions:  Time Aspirin Instructions: Last Dose:  Activity level:  Can go up a flight of stairs and perform activities of daily living without stopping and without symptoms of chest pain or shortness of breath.  Able to exercise without symptoms  Unable to go up a flight of stairs without symptoms of     Anesthesia review:   Patient denies shortness of breath, fever, cough and chest pain at PAT appointment  Patient verbalized understanding of instructions that were given to them at the PAT appointment. Patient was also instructed that they will need to review over the PAT instructions again at home before surgery.

## 2022-08-24 NOTE — Telephone Encounter (Signed)
Pt is aware of recent biopsy results.  She was thankful for the call

## 2022-08-24 NOTE — Telephone Encounter (Signed)
-----   Message from [place], [place] sent at 08/23/2022  5:23 PM EDT ----- Please let patient know: Biopsies only show low grade changes on the cervix. No change in surgical plan.

## 2022-08-25 ENCOUNTER — Encounter (HOSPITAL_COMMUNITY)
Admission: RE | Admit: 2022-08-25 | Discharge: 2022-08-25 | Disposition: A | Discharge status: Home / Self Care | Payer: PRIVATE HEALTH INSURANCE | Source: Ambulatory Visit | Attending: Nurse Practitioner | Admitting: Nurse Practitioner

## 2022-08-28 ENCOUNTER — Telehealth: Payer: Self-pay | Admitting: *Deleted

## 2022-08-28 ENCOUNTER — Other Ambulatory Visit: Payer: Self-pay | Admitting: Gynecologic Oncology

## 2022-08-28 ENCOUNTER — Encounter (HOSPITAL_COMMUNITY)
Admission: RE | Admit: 2022-08-28 | Discharge: 2022-08-28 | Disposition: A | Discharge status: Home / Self Care | Payer: Medicaid Other | Source: Ambulatory Visit | Attending: Psychiatry | Admitting: Psychiatry

## 2022-08-28 ENCOUNTER — Other Ambulatory Visit: Payer: Self-pay

## 2022-08-28 ENCOUNTER — Encounter (HOSPITAL_COMMUNITY): Payer: Self-pay

## 2022-08-28 DIAGNOSIS — E876 Hypokalemia: Secondary | ICD-10-CM

## 2022-08-28 DIAGNOSIS — R1900 Intra-abdominal and pelvic swelling, mass and lump, unspecified site: Secondary | ICD-10-CM | POA: Diagnosis not present

## 2022-08-28 DIAGNOSIS — Z01812 Encounter for preprocedural laboratory examination: Secondary | ICD-10-CM | POA: Diagnosis not present

## 2022-08-28 LAB — CBC
HCT: 40.8 % (ref 36.0–46.0)
Hemoglobin: 12.9 g/dL (ref 12.0–15.0)
MCH: 29.2 pg (ref 26.0–34.0)
MCHC: 31.6 g/dL (ref 30.0–36.0)
MCV: 92.3 fL (ref 80.0–100.0)
Platelets: 159 10*3/uL (ref 150–400)
RBC: 4.42 MIL/uL (ref 3.87–5.11)
RDW: 12.5 % (ref 11.5–15.5)
WBC: 8.2 10*3/uL (ref 4.0–10.5)
nRBC: 0 % (ref 0.0–0.2)

## 2022-08-28 LAB — COMPREHENSIVE METABOLIC PANEL
ALT: 22 U/L (ref 0–44)
AST: 21 U/L (ref 15–41)
Albumin: 4 g/dL (ref 3.5–5.0)
Alkaline Phosphatase: 77 U/L (ref 38–126)
Anion gap: 9 (ref 5–15)
BUN: 8 mg/dL (ref 8–23)
CO2: 26 mmol/L (ref 22–32)
Calcium: 10.1 mg/dL (ref 8.9–10.3)
Chloride: 104 mmol/L (ref 98–111)
Creatinine, Ser: 0.65 mg/dL (ref 0.44–1.00)
GFR, Estimated: >60 mL/min (ref >60)
Glucose, Bld: 90 mg/dL (ref 70–99)
Potassium: 3.1 mmol/L — ABNORMAL LOW (ref 3.5–5.1)
Sodium: 139 mmol/L (ref 135–145)
Total Bilirubin: 0.6 mg/dL (ref 0.3–1.2)
Total Protein: 7.5 g/dL (ref 6.5–8.1)

## 2022-08-28 LAB — TYPE AND SCREEN: ABO/RH(D): O NEG

## 2022-08-28 MED ORDER — POTASSIUM CHLORIDE CRYS ER 20 MEQ PO TBCR
40.0000 meq | EXTENDED_RELEASE_TABLET | Freq: Once | ORAL | 0 refills | Status: DC
Start: 2022-08-28 — End: 2022-09-10

## 2022-08-28 NOTE — Progress Notes (Addendum)
COVID Vaccine Completed:  Date of COVID positive in last 90 days:  PCP - Bertram Denver, NP Cardiologist - no  Chest x-ray -  EKG - 07/06/22 Epic Stress Test -  ECHO -  Cardiac Cath -  Pacemaker/ICD device last checked: Spinal Cord Stimulator:  Bowel Prep - light diet day before  Sleep Study -  CPAP -   Fasting Blood Sugar -  Checks Blood Sugar _____ times a day  Last dose of GLP1 agonist-  N/A GLP1 instructions:  N/A   Last dose of SGLT-2 inhibitors-  N/A SGLT-2 instructions: N/A   Blood Thinner Instructions:  Time Aspirin Instructions: Last Dose:  Activity level:  Can go up a flight of stairs and perform activities of daily living without stopping and without symptoms of chest pain or shortness of breath.c  Able to exercise without symptoms   Anesthesia review: K+ 3.1  Patient denies shortness of breath, fever, cough and chest pain at PAT appointment  Patient verbalized understanding of instructions that were given to them at the PAT appointment. Patient was also instructed that they will need to review over the PAT instructions again at home before surgery.

## 2022-08-28 NOTE — Telephone Encounter (Signed)
-----   Message from Doylene Bode sent at 08/28/2022  3:50 PM EDT ----- This patient is scheduled for surgery tomorrow. Her potassium level is 3.1. Looks like in May she was at 2.9. Is she taking potassium at home? Not on her med list. If not, will send in dose for her to take tonight

## 2022-08-28 NOTE — Progress Notes (Signed)
See RN note. K+ level reviewed with Dr. Alvester Morin. Plan for Kdur 40 meq to be taken tonight x 1.

## 2022-08-28 NOTE — Telephone Encounter (Signed)
Telephone call to check on pre-operative status.  Patient compliant with pre-operative instructions.  Reinforced nothing to eat after midnight. Clear liquids until 1030. Patient to arrive at 1130.  No questions or concerns voiced.  Instructed to call for any needs.  °

## 2022-08-28 NOTE — Telephone Encounter (Signed)
Spoke with Ms. Evelyn Crawford and relayed message from Warner Mccreedy, NP that her potassium level is 3.1. Pt denies taking any potassium supplements. Pt instructed that [facility] for a one time dose of potassium that she needs to take this evening. Pt verbalized understanding.

## 2022-08-28 NOTE — Patient Instructions (Addendum)
SURGICAL WAITING ROOM VISITATION  Patients having surgery or a procedure may have no more than 2 support people in the waiting area - these visitors may rotate.    Children under the age of 57 must have an adult with them who is not the patient.  If the patient needs to stay at the hospital during part of their recovery, the visitor guidelines for inpatient rooms apply. Pre-op nurse will coordinate an appropriate time for 1 support person to accompany patient in pre-op.  This support person may not rotate.    Please refer to the [facility] website for the visitor guidelines for Inpatients (after your surgery is over and you are in a regular room).    Your procedure is scheduled on: 08/29/22   Report to [facility] Main Entrance    Report to admitting at 11:30 AM   Call this number if you have problems the morning of surgery [phone]   Do not eat food :After Midnight.   After Midnight you may have the following liquids until 10:45 AM DAY OF SURGERY  then nothing by mouth  Water Non-Citrus Juices (without pulp, NO RED-Apple, White grape, White cranberry) Black Coffee (NO MILK/CREAM OR CREAMERS, sugar ok)  Clear Tea (NO MILK/CREAM OR CREAMERS, sugar ok) regular and decaf                             Plain Jell-O (NO RED)                                           Fruit ices (not with fruit pulp, NO RED)                                     Popsicles (NO RED)                                                               Sports drinks like Gatorade (NO RED)          If you have questions, please contact your surgeon's office.   FOLLOW BOWEL PREP AND ANY ADDITIONAL PRE OP INSTRUCTIONS YOU RECEIVED FROM YOUR SURGEON'S OFFICE!!!     Oral Hygiene is also important to reduce your risk of infection.                                    Remember - BRUSH YOUR TEETH THE MORNING OF SURGERY WITH YOUR REGULAR TOOTHPASTE  DENTURES WILL BE REMOVED PRIOR TO SURGERY PLEASE DO NOT APPLY  "Poly grip" OR ADHESIVES!!!   Do NOT smoke after Midnight   Take these medicines the morning of surgery with A SIP OF WATER: Atorvastatin   DO NOT TAKE ANY ORAL DIABETIC MEDICATIONS DAY OF YOUR SURGERY  Bring CPAP mask and tubing day of surgery.                              You  may not have any metal on your body including hair pins, jewelry, and body piercing             Do not wear make-up, lotions, powders, perfumes, or deodorant  Do not wear nail polish including gel and S&S, artificial/acrylic nails, or any other type of covering on natural nails including finger and toenails. If you have artificial nails, gel coating, etc. that needs to be removed by a nail salon please have this removed prior to surgery or surgery may need to be canceled/ delayed if the surgeon/ anesthesia feels like they are unable to be safely monitored.   Do not shave  48 hours prior to surgery.    Do not bring valuables to the hospital. [facility] IS NOT             RESPONSIBLE   FOR VALUABLES.   Contacts, glasses, dentures or bridgework may not be worn into surgery.  DO NOT BRING YOUR HOME MEDICATIONS TO THE HOSPITAL. PHARMACY WILL DISPENSE MEDICATIONS LISTED ON YOUR MEDICATION LIST TO YOU DURING YOUR ADMISSION IN THE HOSPITAL!    Patients discharged on the day of surgery will not be allowed to drive home.  Someone NEEDS to stay with you for the first 24 hours after anesthesia.   Special Instructions: Bring a copy of your healthcare power of attorney and living will documents the day of surgery if you haven't scanned them before.              Please read over the following fact sheets you were given: IF YOU HAVE QUESTIONS ABOUT YOUR PRE-OP INSTRUCTIONS PLEASE CALL [phone] Contras    . If you test positive for Covid or have been in contact with anyone that has tested positive in the last 10 days please notify you surgeon.    [facility] - Preparing for Surgery Before surgery, you can play an  important role.  Because skin is not sterile, your skin needs to be as free of germs as possible.  You can reduce the number of germs on your skin by washing with CHG (chlorahexidine gluconate) soap before surgery.  CHG is an antiseptic cleaner which kills germs and bonds with the skin to continue killing germs even after washing. Please DO NOT use if you have an allergy to CHG or antibacterial soaps.  If your skin becomes reddened/irritated stop using the CHG and inform your nurse when you arrive at Short Stay. Do not shave (including legs and underarms) for at least 48 hours prior to the first CHG shower.  You may shave your face/neck.  Please follow these instructions carefully:  1.  Shower with CHG Soap the night before surgery and the  morning of surgery.  2.  If you choose to wash your hair, wash your hair first as usual with your normal  shampoo.  3.  After you shampoo, rinse your hair and body thoroughly to remove the shampoo.                             4.  Use CHG as you would any other liquid soap.  You can apply chg directly to the skin and wash.  Gently with a scrungie or clean washcloth.  5.  Apply the CHG Soap to your body ONLY FROM THE NECK DOWN.   Do   not use on face/ open  Wound or open sores. Avoid contact with eyes, ears mouth and   genitals (private parts).                       Wash face,  Genitals (private parts) with your normal soap.             6.  Wash thoroughly, paying special attention to the area where your    surgery  will be performed.  7.  Thoroughly rinse your body with warm water from the neck down.  8.  DO NOT shower/wash with your normal soap after using and rinsing off the CHG Soap.                9.  Pat yourself dry with a clean towel.            10.  Wear clean pajamas.            11.  Place clean sheets on your bed the night of your first shower and do not  sleep with pets. Day of Surgery : Do not apply any lotions/deodorants the  morning of surgery.  Please wear clean clothes to the hospital/surgery center.  FAILURE TO FOLLOW THESE INSTRUCTIONS MAY RESULT IN THE CANCELLATION OF YOUR SURGERY  PATIENT SIGNATURE_________________________________  NURSE SIGNATURE__________________________________  ________________________________________________________________________ WHAT IS A BLOOD TRANSFUSION? Blood Transfusion Information  A transfusion is the replacement of blood or some of its parts. Blood is made up of multiple cells which provide different functions. Red blood cells carry oxygen and are used for blood loss replacement. White blood cells fight against infection. Platelets control bleeding. Plasma helps clot blood. Other blood products are available for specialized needs, such as hemophilia or other clotting disorders. BEFORE THE TRANSFUSION  Who gives blood for transfusions?  Healthy volunteers who are fully evaluated to make sure their blood is safe. This is blood bank blood. Transfusion therapy is the safest it has ever been in the practice of medicine. Before blood is taken from a donor, a complete history is taken to make sure that person has no history of diseases nor engages in risky social behavior (examples are intravenous drug use or sexual activity with multiple partners). The donor's travel history is screened to minimize risk of transmitting infections, such as malaria. The donated blood is tested for signs of infectious diseases, such as HIV and hepatitis. The blood is then tested to be sure it is compatible with you in order to minimize the chance of a transfusion reaction. If you or a relative donates blood, this is often done in anticipation of surgery and is not appropriate for emergency situations. It takes many days to process the donated blood. RISKS AND COMPLICATIONS Although transfusion therapy is very safe and saves many lives, the main dangers of transfusion include:  Getting an infectious  disease. Developing a transfusion reaction. This is an allergic reaction to something in the blood you were given. Every precaution is taken to prevent this. The decision to have a blood transfusion has been considered carefully by your caregiver before blood is given. Blood is not given unless the benefits outweigh the risks. AFTER THE TRANSFUSION Right after receiving a blood transfusion, you will usually feel much better and more energetic. This is especially true if your red blood cells have gotten low (anemic). The transfusion raises the level of the red blood cells which carry oxygen, and this usually causes an energy increase. The nurse administering the transfusion will  monitor you carefully for complications. HOME CARE INSTRUCTIONS  No special instructions are needed after a transfusion. You may find your energy is better. Speak with your caregiver about any limitations on activity for underlying diseases you may have. SEEK MEDICAL CARE IF:  Your condition is not improving after your transfusion. You develop redness or irritation at the intravenous (IV) site. SEEK IMMEDIATE MEDICAL CARE IF:  Any of the following symptoms occur over the next 12 hours: Shaking chills. You have a temperature by mouth above 102 F (38.9 C), not controlled by medicine. Chest, back, or muscle pain. People around you feel you are not acting correctly or are confused. Shortness of breath or difficulty breathing. Dizziness and fainting. You get a rash or develop hives. You have a decrease in urine output. Your urine turns a dark color or changes to pink, red, or brown. Any of the following symptoms occur over the next 10 days: You have a temperature by mouth above 102 F (38.9 C), not controlled by medicine. Shortness of breath. Weakness after normal activity. The white part of the eye turns yellow (jaundice). You have a decrease in the amount of urine or are urinating less often. Your urine turns a  dark color or changes to pink, red, or brown. Document Released: 01/21/2000 Document Revised: 04/17/2011 Document Reviewed: 09/09/2007 [facility] Patient Information 2014 [place], [place].  _______________________________________________________________________

## 2022-08-29 ENCOUNTER — Ambulatory Visit (HOSPITAL_BASED_OUTPATIENT_CLINIC_OR_DEPARTMENT_OTHER): Payer: PRIVATE HEALTH INSURANCE | Admitting: Anesthesiology

## 2022-08-29 ENCOUNTER — Encounter (HOSPITAL_COMMUNITY): Payer: Self-pay | Admitting: Psychiatry

## 2022-08-29 ENCOUNTER — Encounter (HOSPITAL_COMMUNITY)
Admission: RE | Disposition: A | Discharge status: Home / Self Care | Payer: Self-pay | Source: Home / Self Care | Attending: Psychiatry

## 2022-08-29 ENCOUNTER — Other Ambulatory Visit: Payer: Self-pay

## 2022-08-29 ENCOUNTER — Ambulatory Visit (HOSPITAL_COMMUNITY): Payer: PRIVATE HEALTH INSURANCE | Admitting: Anesthesiology

## 2022-08-29 ENCOUNTER — Ambulatory Visit (HOSPITAL_COMMUNITY)
Admission: RE | Admit: 2022-08-29 | Discharge: 2022-08-29 | Disposition: A | Discharge status: Home / Self Care | Payer: PRIVATE HEALTH INSURANCE | Attending: Psychiatry | Admitting: Psychiatry

## 2022-08-29 DIAGNOSIS — Z98891 History of uterine scar from previous surgery: Secondary | ICD-10-CM | POA: Diagnosis not present

## 2022-08-29 DIAGNOSIS — R102 Pelvic and perineal pain: Secondary | ICD-10-CM | POA: Diagnosis present

## 2022-08-29 DIAGNOSIS — D271 Benign neoplasm of left ovary: Secondary | ICD-10-CM | POA: Insufficient documentation

## 2022-08-29 DIAGNOSIS — N870 Mild cervical dysplasia: Secondary | ICD-10-CM | POA: Diagnosis not present

## 2022-08-29 DIAGNOSIS — D259 Leiomyoma of uterus, unspecified: Secondary | ICD-10-CM | POA: Diagnosis present

## 2022-08-29 DIAGNOSIS — F17210 Nicotine dependence, cigarettes, uncomplicated: Secondary | ICD-10-CM | POA: Diagnosis not present

## 2022-08-29 DIAGNOSIS — N72 Inflammatory disease of cervix uteri: Secondary | ICD-10-CM | POA: Diagnosis not present

## 2022-08-29 DIAGNOSIS — N840 Polyp of corpus uteri: Secondary | ICD-10-CM | POA: Diagnosis not present

## 2022-08-29 DIAGNOSIS — R1900 Intra-abdominal and pelvic swelling, mass and lump, unspecified site: Secondary | ICD-10-CM

## 2022-08-29 DIAGNOSIS — N8003 Adenomyosis of the uterus: Secondary | ICD-10-CM | POA: Diagnosis not present

## 2022-08-29 DIAGNOSIS — Z842 Family history of other diseases of the genitourinary system: Secondary | ICD-10-CM | POA: Insufficient documentation

## 2022-08-29 DIAGNOSIS — N736 Female pelvic peritoneal adhesions (postinfective): Secondary | ICD-10-CM

## 2022-08-29 HISTORY — PX: ROBOTIC ASSISTED TOTAL HYSTERECTOMY WITH BILATERAL SALPINGO OOPHERECTOMY: SHX6086

## 2022-08-29 HISTORY — PX: LAPAROTOMY WITH STAGING: SHX5938

## 2022-08-29 HISTORY — PX: CYSTOSCOPY: SHX5120

## 2022-08-29 LAB — TYPE AND SCREEN: Antibody Screen: NEGATIVE

## 2022-08-29 SURGERY — HYSTERECTOMY, TOTAL, ROBOT-ASSISTED, LAPAROSCOPIC, WITH BILATERAL SALPINGO-OOPHORECTOMY
Anesthesia: General

## 2022-08-29 MED ORDER — KETAMINE HCL 10 MG/ML IJ SOLN
INTRAMUSCULAR | Status: DC | PRN
  Administered 2022-08-29: 50 mg via INTRAVENOUS

## 2022-08-29 MED ORDER — PROPOFOL 10 MG/ML IV BOLUS
INTRAVENOUS | Status: AC
  Filled 2022-08-29: qty 20

## 2022-08-29 MED ORDER — PROPOFOL 10 MG/ML IV BOLUS: INTRAVENOUS | Status: DC | PRN

## 2022-08-29 MED ORDER — BUPIVACAINE LIPOSOME 1.3 % IJ SUSP
INTRAMUSCULAR | Status: DC | PRN
  Administered 2022-08-29: 20 mL

## 2022-08-29 MED ORDER — DEXAMETHASONE SODIUM PHOSPHATE 4 MG/ML IJ SOLN: 4.0000 mg | INTRAMUSCULAR | Status: DC

## 2022-08-29 MED ORDER — LIDOCAINE HCL (PF) 2 % IJ SOLN
INTRAMUSCULAR | Status: AC
  Filled 2022-08-29: qty 5

## 2022-08-29 MED ORDER — LACTATED RINGERS IR SOLN
Status: DC | PRN
  Administered 2022-08-29: 1000 mL

## 2022-08-29 MED ORDER — HEPARIN SODIUM (PORCINE) 5000 UNIT/ML IJ SOLN
5000.0000 [IU] | INTRAMUSCULAR | Status: AC
  Administered 2022-08-29: 5000 [IU] via SUBCUTANEOUS
  Filled 2022-08-29: qty 1

## 2022-08-29 MED ORDER — OXYCODONE HCL 5 MG PO TABS
ORAL_TABLET | ORAL | Status: AC
  Administered 2022-08-29: 5 mg via ORAL
  Filled 2022-08-29: qty 1

## 2022-08-29 MED ORDER — ROCURONIUM BROMIDE 10 MG/ML (PF) SYRINGE
PREFILLED_SYRINGE | INTRAVENOUS | Status: AC
  Filled 2022-08-29: qty 10

## 2022-08-29 MED ORDER — CEFAZOLIN SODIUM-DEXTROSE 2-4 GM/100ML-% IV SOLN
2.0000 g | INTRAVENOUS | Status: AC
  Administered 2022-08-29: 2 g via INTRAVENOUS
  Filled 2022-08-29: qty 100

## 2022-08-29 MED ORDER — HYDROMORPHONE HCL 1 MG/ML IJ SOLN
INTRAMUSCULAR | Status: AC
  Administered 2022-08-29: 0.5 mg via INTRAVENOUS
  Filled 2022-08-29: qty 1

## 2022-08-29 MED ORDER — BUPIVACAINE LIPOSOME 1.3 % IJ SUSP
INTRAMUSCULAR | Status: AC
  Filled 2022-08-29: qty 20

## 2022-08-29 MED ORDER — ONDANSETRON HCL 4 MG/2ML IJ SOLN
INTRAMUSCULAR | Status: DC | PRN
Start: 2022-08-29 — End: 2022-08-29
  Administered 2022-08-29: 4 mg via INTRAVENOUS

## 2022-08-29 MED ORDER — ROCURONIUM BROMIDE 10 MG/ML (PF) SYRINGE
PREFILLED_SYRINGE | INTRAVENOUS | Status: DC | PRN
  Administered 2022-08-29: 10 mg via INTRAVENOUS
  Administered 2022-08-29: 60 mg via INTRAVENOUS
  Administered 2022-08-29 (×2): 10 mg via INTRAVENOUS

## 2022-08-29 MED ORDER — MIDAZOLAM HCL 2 MG/2ML IJ SOLN
INTRAMUSCULAR | Status: DC | PRN
  Administered 2022-08-29: 2 mg via INTRAVENOUS

## 2022-08-29 MED ORDER — LACTATED RINGERS IV SOLN: INTRAVENOUS | Status: DC

## 2022-08-29 MED ORDER — STERILE WATER FOR IRRIGATION IR SOLN
Status: DC | PRN
  Administered 2022-08-29: 1000 mL

## 2022-08-29 MED ORDER — FENTANYL CITRATE (PF) 250 MCG/5ML IJ SOLN
INTRAMUSCULAR | Status: AC
  Filled 2022-08-29: qty 5

## 2022-08-29 MED ORDER — ACETAMINOPHEN 500 MG PO TABS
1000.0000 mg | ORAL_TABLET | ORAL | Status: AC
  Administered 2022-08-29: 1000 mg via ORAL
  Filled 2022-08-29: qty 2

## 2022-08-29 MED ORDER — BUPIVACAINE HCL 0.25 % IJ SOLN
INTRAMUSCULAR | Status: AC
  Filled 2022-08-29: qty 1

## 2022-08-29 MED ORDER — FENTANYL CITRATE (PF) 250 MCG/5ML IJ SOLN
INTRAMUSCULAR | Status: DC | PRN
  Administered 2022-08-29: 50 ug via INTRAVENOUS
  Administered 2022-08-29: 100 ug via INTRAVENOUS
  Administered 2022-08-29 (×2): 50 ug via INTRAVENOUS

## 2022-08-29 MED ORDER — LIDOCAINE 2% (20 MG/ML) 5 ML SYRINGE
INTRAMUSCULAR | Status: DC | PRN
  Administered 2022-08-29: 60 mg via INTRAVENOUS
  Administered 2022-08-29: 1.5 mg/kg/h via INTRAVENOUS

## 2022-08-29 MED ORDER — DEXAMETHASONE SODIUM PHOSPHATE 10 MG/ML IJ SOLN
INTRAMUSCULAR | Status: AC
  Filled 2022-08-29: qty 1

## 2022-08-29 MED ORDER — DEXAMETHASONE SODIUM PHOSPHATE 10 MG/ML IJ SOLN
INTRAMUSCULAR | Status: DC | PRN
  Administered 2022-08-29: 10 mg via INTRAVENOUS

## 2022-08-29 MED ORDER — BUPIVACAINE HCL 0.25 % IJ SOLN
INTRAMUSCULAR | Status: DC | PRN
  Administered 2022-08-29: 20 mL

## 2022-08-29 MED ORDER — 0.9 % SODIUM CHLORIDE (POUR BTL) OPTIME
TOPICAL | Status: DC | PRN
  Administered 2022-08-29: 1000 mL

## 2022-08-29 MED ORDER — LACTATED RINGERS IV SOLN: INTRAVENOUS | Status: DC | PRN

## 2022-08-29 MED ORDER — ORAL CARE MOUTH RINSE: 15.0000 mL | Freq: Once | OROMUCOSAL | Status: AC

## 2022-08-29 MED ORDER — OXYCODONE HCL 5 MG PO TABS: 5.0000 mg | ORAL_TABLET | Freq: Once | ORAL | Status: AC

## 2022-08-29 MED ORDER — SUGAMMADEX SODIUM 200 MG/2ML IV SOLN
INTRAVENOUS | Status: DC | PRN
  Administered 2022-08-29 (×2): 100 mg via INTRAVENOUS

## 2022-08-29 MED ORDER — PROPOFOL 10 MG/ML IV BOLUS
INTRAVENOUS | Status: DC | PRN
  Administered 2022-08-29: 150 mg via INTRAVENOUS

## 2022-08-29 MED ORDER — ONDANSETRON HCL 4 MG/2ML IJ SOLN
INTRAMUSCULAR | Status: AC
  Filled 2022-08-29: qty 2

## 2022-08-29 MED ORDER — POVIDONE-IODINE 10 % EX SWAB: 2.0000 | Freq: Once | CUTANEOUS | Status: DC

## 2022-08-29 MED ORDER — CHLORHEXIDINE GLUCONATE 0.12 % MT SOLN
15.0000 mL | Freq: Once | OROMUCOSAL | Status: AC
  Administered 2022-08-29: 15 mL via OROMUCOSAL

## 2022-08-29 MED ORDER — GLYCOPYRROLATE 0.2 MG/ML IJ SOLN
INTRAMUSCULAR | Status: AC
  Filled 2022-08-29: qty 1

## 2022-08-29 MED ORDER — SCOPOLAMINE 1 MG/3DAYS TD PT72
1.0000 | MEDICATED_PATCH | TRANSDERMAL | Status: DC
  Administered 2022-08-29: 1.5 mg via TRANSDERMAL
  Filled 2022-08-29: qty 1

## 2022-08-29 MED ORDER — MIDAZOLAM HCL 2 MG/2ML IJ SOLN
INTRAMUSCULAR | Status: AC
  Filled 2022-08-29: qty 2

## 2022-08-29 MED ORDER — LIDOCAINE HCL (PF) 2 % IJ SOLN
INTRAMUSCULAR | Status: AC
  Filled 2022-08-29: qty 10

## 2022-08-29 MED ORDER — GLYCOPYRROLATE 0.2 MG/ML IJ SOLN
INTRAMUSCULAR | Status: DC | PRN
  Administered 2022-08-29: .2 mg via INTRAVENOUS

## 2022-08-29 MED ORDER — KETAMINE HCL 50 MG/5ML IJ SOSY
PREFILLED_SYRINGE | INTRAMUSCULAR | Status: AC
  Filled 2022-08-29: qty 5

## 2022-08-29 MED ORDER — HYDROMORPHONE HCL 1 MG/ML IJ SOLN
INTRAMUSCULAR | Status: AC
  Filled 2022-08-29: qty 1

## 2022-08-29 MED ORDER — HYDROMORPHONE HCL 1 MG/ML IJ SOLN
0.2500 mg | INTRAMUSCULAR | Status: DC | PRN
  Administered 2022-08-29 (×2): 0.5 mg via INTRAVENOUS

## 2022-08-29 SURGICAL SUPPLY — 81 items
ADH SKN CLS APL DERMABOND .7 (GAUZE/BANDAGES/DRESSINGS) ×6
AGENT HMST KT MTR STRL THRMB (HEMOSTASIS) ×3
APL ESCP 34 STRL LF DISP (HEMOSTASIS) ×3
APPLICATOR SURGIFLO ENDO (HEMOSTASIS) IMPLANT
BAG LAPAROSCOPIC 12 15 PORT 16 (BASKET) IMPLANT
BAG RETRIEVAL 12/15 (BASKET)
BLADE SURG SZ10 CARB STEEL (BLADE) IMPLANT
COVER BACK TABLE 60X90IN (DRAPES) ×4 IMPLANT
COVER TIP SHEARS 8 DVNC (MISCELLANEOUS) ×4 IMPLANT
DERMABOND ADVANCED .7 DNX12 (GAUZE/BANDAGES/DRESSINGS) ×4 IMPLANT
DRAPE ARM DVNC X/XI (DISPOSABLE) ×16 IMPLANT
DRAPE COLUMN DVNC XI (DISPOSABLE) ×4 IMPLANT
DRAPE SHEET LG 3/4 BI-LAMINATE (DRAPES) ×4 IMPLANT
DRAPE SURG IRRIG POUCH 19X23 (DRAPES) ×4 IMPLANT
DRIVER NDL MEGA SUTCUT DVNCXI (INSTRUMENTS) ×4 IMPLANT
DRIVER NDLE MEGA SUTCUT DVNCXI (INSTRUMENTS) ×3 IMPLANT
DRSG OPSITE POSTOP 4X6 (GAUZE/BANDAGES/DRESSINGS) IMPLANT
DRSG OPSITE POSTOP 4X8 (GAUZE/BANDAGES/DRESSINGS) IMPLANT
ELECT PENCIL ROCKER SW 15FT (MISCELLANEOUS) IMPLANT
ELECT REM PT RETURN 15FT ADLT (MISCELLANEOUS) ×4 IMPLANT
FORCEPS BPLR FENES DVNC XI (FORCEP) ×4 IMPLANT
FORCEPS PROGRASP DVNC XI (FORCEP) ×4 IMPLANT
GAUZE 4X4 16PLY ~~LOC~~+RFID DBL (SPONGE) ×4 IMPLANT
GLOVE BIO SURGEON STRL SZ 6 (GLOVE) ×16 IMPLANT
GLOVE BIO SURGEON STRL SZ 6.5 (GLOVE) ×4 IMPLANT
GLOVE BIOGEL PI IND STRL 6.5 (GLOVE) ×8 IMPLANT
GOWN STRL REUS W/ TWL LRG LVL3 (GOWN DISPOSABLE) ×16 IMPLANT
GOWN STRL REUS W/TWL LRG LVL3 (GOWN DISPOSABLE) ×24
GRASPER SUT TROCAR 14GX15 (MISCELLANEOUS) IMPLANT
HOLDER FOLEY CATH W/STRAP (MISCELLANEOUS) IMPLANT
IRRIG SUCT STRYKERFLOW 2 WTIP (MISCELLANEOUS) ×3
IRRIGATION SUCT STRKRFLW 2 WTP (MISCELLANEOUS) ×4 IMPLANT
KIT PROCEDURE DVNC SI (MISCELLANEOUS) IMPLANT
KIT TURNOVER KIT A (KITS) IMPLANT
LIGASURE IMPACT 36 18CM CVD LR (INSTRUMENTS) IMPLANT
MANIPULATOR ADVINCU DEL 3.0 PL (MISCELLANEOUS) IMPLANT
MANIPULATOR ADVINCU DEL 3.5 PL (MISCELLANEOUS) IMPLANT
MANIPULATOR UTERINE 4.5 ZUMI (MISCELLANEOUS) IMPLANT
NDL HYPO 21X1.5 SAFETY (NEEDLE) ×4 IMPLANT
NDL INSUFFLATION 14GA 120MM (NEEDLE) IMPLANT
NDL SPNL 20GX3.5 QUINCKE YW (NEEDLE) IMPLANT
NEEDLE HYPO 21X1.5 SAFETY (NEEDLE) ×3 IMPLANT
NEEDLE INSUFFLATION 14GA 120MM (NEEDLE) IMPLANT
NEEDLE SPNL 20GX3.5 QUINCKE YW (NEEDLE) IMPLANT
OBTURATOR OPTICAL STND 8 DVNC (TROCAR) ×3
OBTURATOR OPTICALSTD 8 DVNC (TROCAR) ×4 IMPLANT
PACK ROBOT GYN CUSTOM WL (TRAY / TRAY PROCEDURE) ×4 IMPLANT
PAD ARMBOARD 7.5X6 YLW CONV (MISCELLANEOUS) ×4 IMPLANT
PAD POSITIONING PINK XL (MISCELLANEOUS) ×4 IMPLANT
PORT ACCESS TROCAR AIRSEAL 12 (TROCAR) IMPLANT
SCISSORS MNPLR CVD DVNC XI (INSTRUMENTS) ×4 IMPLANT
SCRUB CHG 4% DYNA-HEX 4OZ (MISCELLANEOUS) ×8 IMPLANT
SEAL UNIV 5-12 XI (MISCELLANEOUS) ×16 IMPLANT
SET TRI-LUMEN FLTR TB AIRSEAL (TUBING) ×4 IMPLANT
SPIKE FLUID TRANSFER (MISCELLANEOUS) ×4 IMPLANT
SPONGE T-LAP 18X18 ~~LOC~~+RFID (SPONGE) IMPLANT
SURGIFLO W/THROMBIN 8M KIT (HEMOSTASIS) IMPLANT
SUT MNCRL AB 4-0 PS2 18 (SUTURE) IMPLANT
SUT PDS AB 1 TP1 54 (SUTURE) IMPLANT
SUT VIC AB 0 CT1 27 (SUTURE)
SUT VIC AB 0 CT1 27XBRD ANTBC (SUTURE) IMPLANT
SUT VIC AB 2-0 CT1 27 (SUTURE) ×3
SUT VIC AB 2-0 CT1 TAPERPNT 27 (SUTURE) IMPLANT
SUT VIC AB 3-0 SH 27 (SUTURE) ×3
SUT VIC AB 3-0 SH 27X BRD (SUTURE) IMPLANT
SUT VIC AB 4-0 PS2 18 (SUTURE) ×8 IMPLANT
SUT VICRYL 0 27 CT2 27 ABS (SUTURE) IMPLANT
SUT VLOC 180 0 9IN GS21 (SUTURE) IMPLANT
SYR 10ML LL (SYRINGE) IMPLANT
SYS BAG RETRIEVAL 10MM (BASKET) ×6
SYS WOUND ALEXIS 18CM MED (MISCELLANEOUS) ×3
SYSTEM BAG RETRIEVAL 10MM (BASKET) IMPLANT
SYSTEM WOUND ALEXIS 18CM MED (MISCELLANEOUS) IMPLANT
TOWEL OR NON WOVEN STRL DISP B (DISPOSABLE) IMPLANT
TRAP SPECIMEN MUCUS 40CC (MISCELLANEOUS) IMPLANT
TRAY FOLEY MTR SLVR 16FR STAT (SET/KITS/TRAYS/PACK) ×4 IMPLANT
TROCAR PORT AIRSEAL 5X120 (TROCAR) IMPLANT
UNDERPAD 30X36 HEAVY ABSORB (UNDERPADS AND DIAPERS) ×8 IMPLANT
WATER STERILE IRR 1000ML POUR (IV SOLUTION) ×4 IMPLANT
YANKAUER SUCT BULB TIP 10FT TU (MISCELLANEOUS) IMPLANT
YANKAUER SUCT BULB TIP NO VENT (SUCTIONS) IMPLANT

## 2022-08-29 NOTE — Op Note (Signed)
GYNECOLOGIC ONCOLOGY OPERATIVE NOTE  Date of Service: 08/29/2022  Preoperative Diagnosis: Solid adnexal mass, uterine fibroids, pelvic pain  Postoperative Diagnosis: Left ovarian fibroma, uterine fibroids, adhesions  Procedures: Robotic-assisted total laparoscopic hysterectomy (>250g), bilateral salpingo-oophorectomy, lysis of adhesions, cystoscopy, minilaparotomy for specimen retrieval (Modifer 22: adhesions from prior cesarean deliveries increasing the complexity of the case and necessitating additional instrumentation for retraction and to create safe exposure. Adhesions related complexity increased the duration of the procedure by 45 minutes.)   Surgeon: Clide Cliff, MD  Assistants: Antionette Char, MD and (an MD assistant was necessary for tissue manipulation, management of robotic instrumentation, retraction and positioning due to the complexity of the case and hospital policies)  Anesthesia: General  Estimated Blood Loss: 100 mL   Fluids: 2250 ml, crystalloid  Urine Output: 150 ml, clear yellow  Findings: On entry to abdomen, normal upper abdominal survey with smooth diaphragm, liver, stomach and normal appearing omentum and bowel. Distal omentum adherent to the inferior midline abdominal wall.  Normal right fallopian tube and ovary.  Normal left fallopian tube.  Approximate 4 cm solid left ovary.  Enlarged fibroid uterus with a dominant right anterio-lateral subserosal fibroid and left uterine body subserosal fibroid.  Dense adhesions of the bladder to the uterus from prior cesarean deliveries.  Bladder backfilled to better delineate bladder wall.  A 1 cm area of superficial defect (not full thickness) in the bladder dome, repaired.  Cystoscopy after completing procedure with no evidence of injury to bladder or leak.  Bilateral ureteral orifices visualized with strong jets.  Subcentimeter pink polypoid structure noted from the midline posterior wall of the bladder.  Intraoperative  frozen section consistent with benign ovarian fibroma.   Specimens:  ID Type Source Tests Collected by Time Destination  1 : Left tube and ovary Tissue PATH Gyn tumor resection SURGICAL PATHOLOGY Clide Cliff, MD 08/29/2022 1543   2 : Right tube and ovary Tissue PATH Gyn tumor resection SURGICAL PATHOLOGY Clide Cliff, MD 08/29/2022 1558   3 : Uterus and cervix Tissue PATH Gyn benign resection SURGICAL PATHOLOGY Clide Cliff, MD 08/29/2022 1703   A : Pelvic washings Body Fluid PATH Cytology Pelvic Washing CYTOLOGY - NON PAP Clide Cliff, MD 08/29/2022 1514     Complications:  None  Indications for Procedure: Evelyn Crawford is a 62 y.o. woman with a new solid adnexal mass and pelvic pain and bulk symptoms from uterine fibroids.  Prior to the procedure, all risks, benefits, and alternatives were discussed and informed surgical consent was signed.  Procedure: Patient was taken to the operating room where general anesthesia was achieved.  She was positioned in dorsal lithotomy and prepped and draped.  A foley catheter was inserted into the bladder.  The cervix was dilated and an Advincula uterine manipulator with a colpotomy ring was inserted into the uterus.  A 10 mm incision was made in the left upper quadrant near Palmer's point.  The abdomen was entered with a 5 mm OptiView trocar under direct visualization. The abdomen was insufflated, the patient placed in steep Trendelenburg, and additional trocars were placed as follows: an 8mm trocar superior to the umbilicus, two 8 mm robotic trocars in the right abdomen, and one 8 mm robotic trocar in the left abdomen.  The left upper quadrant trocar was removed and replaced with a 12 mm airseal trocar.  All trocars were placed under direct visualization.  The bowels were moved into the upper abdomen.  The DaVinci robotic surgical system was brought to the  patient's bedside and docked.   Initially during insufflation, challenges with  ventilation that would resolve with desufflation.  Ultimately, patient safe to proceed with surgery.  Plan made to start with removal adnexa and reevaluate for hysterectomy if patient continuing to have a stable respiratory status.  Adhesions of the omentum to the anterior abdominal wall were cauterized and lysed with bipolar and monopolar scissors.  The omentum and remainder of the small bowel where mobilized to the upper abdomen. The right retroperitoneum was incised and entered.  The right ureter was identified.  The right infundibulopelvic ligament was isolated, cauterized, and transected.  The broad ligament was incised to the uterine cornu.  The utero-ovarian ligament and the proximal fallopian tube were isolated, cauterized, and transected.  The same procedure was performed on the contralateral side.  Both specimens were placed into separate Endo-Catch bags.  The robotic instruments were removed.  The supraumbilical robotic trocar was removed and the incision extended sharply to approximately 3 cm.  The Endo Catch bags were grasped and brought up through this incision.  The left adnexa was sent for frozen pathology.  An [employer] was then placed at this incision and the robotic trocar reintroduced.  The abdomen was insufflated and the robotic trocars were reintroduced.  At this time, patient continued to have a stable respiratory status, tolerating the procedure well, so decision made to proceed with hysterectomy as previously planned.  The right round ligament was transected.  The right ureter was again identified. The posterior peritoneum was opened to the KOH ring.  This was repeated on the contralateral side.  The anterior peritoneum was then opened towards the bladder.  Adhesions from prior cesarean deliveries were meticulously dissected sharply with scissors.  At this time, given the dense nature of the adhesions, the bladder was backfilled with 200 cc of water to better delineate the  bladder wall.  Additional adhesiolysis was performed.  The endopelvic fascia could then be identified and the bladder flap created.  The bladder was mobilized below the level of the ring. The right uterine artery was skeletonized, cauterized, and transected above the level of the KOH ring given the long length of the cervix. Additional cautery was used in a C-shaped fashion to allow the remainder of the broad, cardinal, and uterosacral ligaments with the uterine vessels to be transected and fall away from the KOH ring. A similar procedure was performed on the left side.  A colpotomy was made circumferentially following the contours of the KOH ring.  The uterine specimen was placed in an Endo Catch bag for later retrieval.  The vaginal cuff was closed with a running stitch of 0 Vicryl suture.  A figure-of-eight stitch of 3-0 Vicryl was placed in the serosa of the bladder at the dome to imbricate over a 1 cm superficial defect. The pelvis was irrigated.  Surgiflo was placed in the pelvis due to some oozing from extensive adhesiolysis. All operative sites were found to be hemostatic.    The bladder was then again backfilled with 200 cc of water.  A 5 mm scope was introduced into the bladder and a survey was performed with the findings as noted above.  The Foley was replaced to drain the bladder. All robotic instruments were removed and the robot was taken from the patient's bedside. The fascia at the 12 mm incision was closed with 0 Vicryl using a PMI device.   The supraumbilical trocar was removed.  The cap on the [place] device was  removed and the Endo Catch bag brought up through this incision.  The incision was extended and approximate additional 1 cm.  The uterine specimen was morcellated within the Endo Catch bag with a scalpel and removed the abdomen. The abdomen was desufflated and all ports were removed.  The fascia the midline incision was closed with a running stitch of #1 PDS.  The subcutaneous tissue  was irrigated and made hemostatic.  Exparel was injected in a standard fashion.  The subcutaneous tissues were reapproximated with a layer of 2-0 Vicryl in a running fashion in a deep dermal running stitch of 3-0 Vicryl.  The skin was then closed with 4-0 Monocryl in the subcuticular fashion followed by surgical glue. The skin at all laparoscopic incisions was closed with 4-0 Vicryl to reapproximate the subcutaneous tissue and 4-0 monocryl in a subcuticular fashion followed by surgical glue.  Patient tolerated the procedure well. Sponge, lap, and instrument counts were correct.  Patient received 2 gm of Ancef prior to skin incision for routine perioperative antibiotic prophylaxis.  She was extubated and taken to the PACU in stable condition.  Clide Cliff, MD Gynecologic Oncology

## 2022-08-29 NOTE — Anesthesia Postprocedure Evaluation (Signed)
Anesthesia Post Note  Patient: TAYAH IDROVO  Procedure(s) Performed: XI ROBOTIC ASSISTED TOTAL HYSTERECTOMY WITH BILATERAL SALPINGO OOPHORECTOMY GREATER THAN 250GRAMS (Bilateral) MINI LAPAROTOMY CYSTOSCOPY     Patient location during evaluation: PACU Anesthesia Type: General Level of consciousness: awake and alert Pain management: pain level controlled Vital Signs Assessment: post-procedure vital signs reviewed and stable Respiratory status: spontaneous breathing, nonlabored ventilation and respiratory function stable Cardiovascular status: blood pressure returned to baseline and stable Postop Assessment: no apparent nausea or vomiting Anesthetic complications: no  No notable events documented.  Last Vitals:  Vitals:   08/29/22 1930 08/29/22 1945  BP: 112/74 120/86  Pulse: 64 72  Resp: 12 14  Temp:    SpO2: 93% 93%    Last Pain:  Vitals:   08/29/22 1902  TempSrc:   PainSc: 8                  Zaiyah Sottile,W. EDMOND

## 2022-08-29 NOTE — Anesthesia Preprocedure Evaluation (Addendum)
Anesthesia Evaluation  Patient identified by MRN, date of birth, ID band Patient awake    Reviewed: Allergy & Precautions, H&P , NPO status , Patient's Chart, lab work & pertinent test results  Airway Mallampati: III  TM Distance: >3 FB Neck ROM: Full    Dental no notable dental hx. (+) Poor Dentition, Dental Advisory Given   Pulmonary Current Smoker and Patient abstained from smoking.   Pulmonary exam normal breath sounds clear to auscultation       Cardiovascular hypertension, Pt. on medications  Rhythm:Regular Rate:Normal     Neuro/Psych negative neurological ROS  negative psych ROS   GI/Hepatic negative GI ROS, Neg liver ROS,,,  Endo/Other  negative endocrine ROS    Renal/GU negative Renal ROS  negative genitourinary   Musculoskeletal   Abdominal   Peds  Hematology  (+) Blood dyscrasia, anemia   Anesthesia Other Findings   Reproductive/Obstetrics negative OB ROS                             Anesthesia Physical Anesthesia Plan  ASA: 2  Anesthesia Plan: General   Post-op Pain Management: Tylenol PO (pre-op)*   Induction: Intravenous  PONV Risk Score and Plan: 3 and Ondansetron, Dexamethasone and Midazolam  Airway Management Planned: Oral ETT  Additional Equipment:   Intra-op Plan:   Post-operative Plan: Extubation in OR  Informed Consent: I have reviewed the patients History and Physical, chart, labs and discussed the procedure including the risks, benefits and alternatives for the proposed anesthesia with the patient or authorized representative who has indicated his/her understanding and acceptance.     Dental advisory given  Plan Discussed with: CRNA  Anesthesia Plan Comments:        Anesthesia Quick Evaluation

## 2022-08-29 NOTE — Transfer of Care (Signed)
Immediate Anesthesia Transfer of Care Note  Patient: Evelyn Crawford  Procedure(s) Performed: XI ROBOTIC ASSISTED TOTAL HYSTERECTOMY WITH BILATERAL SALPINGO OOPHORECTOMY GREATER THAN 250GRAMS (Bilateral) MINI LAPAROTOMY CYSTOSCOPY  Patient Location: PACU  Anesthesia Type:General  Level of Consciousness: drowsy and patient cooperative  Airway & Oxygen Therapy: Patient Spontanous Breathing and Patient connected to face mask oxygen  Post-op Assessment: Report given to RN and Post -op Vital signs reviewed and stable  Post vital signs: Reviewed and stable  Last Vitals:  Vitals Value Taken Time  BP 149/73 08/29/22 1837  Temp    Pulse 75 08/29/22 1841  Resp 11 08/29/22 1840  SpO2 100 % 08/29/22 1841  Vitals shown include unfiled device data.  Last Pain:  Vitals:   08/29/22 1200  TempSrc:   PainSc: 0-No pain         Complications: No notable events documented.

## 2022-08-29 NOTE — Discharge Instructions (Addendum)
AFTER SURGERY INSTRUCTIONS   Return to work: 4-6 weeks if applicable   Activity: 1. Be up and out of the bed during the day.  Take a nap if needed.  You may walk up steps but be careful and use the hand rail.  Stair climbing will tire you more than you think, you may need to stop part way and rest.    2. No lifting or straining for 6 weeks over 10 pounds. No pushing, pulling, straining for 6 weeks.   3. No driving for around 1 week(s).  Do not drive if you are taking narcotic pain medicine and make sure that your reaction time has returned.    4. You can shower as soon as the next day after surgery. Shower daily.  Use your regular soap and water (not directly on the incision) and pat your incision(s) dry afterwards; don't rub.  No tub baths or submerging your body in water until cleared by your surgeon. If you have the soap that was given to you by pre-surgical testing that was used before surgery, you do not need to use it afterwards because this can irritate your incisions.    5. No sexual activity and nothing in the vagina for 12 weeks.   6. You may experience a small amount of clear drainage from your incisions, which is normal.  If the drainage persists, increases, or changes color please call the office.   7. Do not use creams, lotions, or ointments such as neosporin on your incisions after surgery until advised by your surgeon because they can cause removal of the dermabond glue on your incisions.     8. You may experience vaginal spotting after surgery or when the stitches at the top of the vagina begin to dissolve.  The spotting is normal but if you experience heavy bleeding, call our office.   9. Take Tylenol or ibuprofen first for pain if you are able to take these medications and only use narcotic pain medication for severe pain not relieved by the Tylenol or Ibuprofen.  Monitor your Tylenol intake to a max of 4,000 mg in a 24 hour period. You can alternate these medications after  surgery.   Diet: 1. Low sodium Heart Healthy Diet is recommended but you are cleared to resume your normal (before surgery) diet after your procedure.   2. It is safe to use a laxative, such as Miralax or Colace, if you have difficulty moving your bowels. You have been prescribed Sennakot-S to take at bedtime every evening after surgery to keep bowel movements regular and to prevent constipation.     Wound Care: 1. Keep clean and dry.  Shower daily.   Reasons to call the Doctor: Fever - Oral temperature greater than 100.4 degrees Fahrenheit Foul-smelling vaginal discharge Difficulty urinating Nausea and vomiting Increased pain at the site of the incision that is unrelieved with pain medicine. Difficulty breathing with or without chest pain New calf pain especially if only on one side Sudden, continuing increased vaginal bleeding with or without clots.   Contacts: For questions or concerns you should contact:   Dr. Clide Cliff at [phone]   Warner Mccreedy, NP at [phone]   After Hours: call [phone] and have the GYN Oncologist paged/contacted (after 5 pm or on the weekends). You will speak with an after hours RN and let he or she know you have had surgery.   Messages sent via mychart are for non-urgent matters and are not responded to after  hours so for urgent needs, please call the after hours number.

## 2022-08-29 NOTE — Interval H&P Note (Signed)
History and Physical Interval Note:  08/29/2022 2:00 PM  Evelyn Crawford  has presented today for surgery, with the diagnosis of PELVIC MASS AND FIBROIDS.  The various methods of treatment have been discussed with the patient and family. After consideration of risks, benefits and other options for treatment, the patient has consented to  Procedure(s): XI ROBOTIC ASSISTED TOTAL HYSTERECTOMY WITH BILATERAL SALPINGO OOPHORECTOMY (Bilateral) POSSIBLE MINI LAPAROTOMY WITH POSSIBLE STAGING (N/A) as a surgical intervention.  The patient's history has been reviewed, patient examined, no change in status, stable for surgery.  I have reviewed the patient's chart and labs.  Questions were answered to the patient's satisfaction.     [name]

## 2022-08-29 NOTE — Anesthesia Procedure Notes (Signed)
Procedure Name: Intubation Date/Time: 08/29/2022 2:33 PM  Performed by: Florene Route, CRNAPre-anesthesia Checklist: Patient identified, Emergency Drugs available, Suction available and Patient being monitored Patient Re-evaluated:Patient Re-evaluated prior to induction Oxygen Delivery Method: Circle system utilized Preoxygenation: Pre-oxygenation with 100% oxygen Induction Type: IV induction Ventilation: Mask ventilation without difficulty and Oral airway inserted - appropriate to patient size Laryngoscope Size: Hyacinth Meeker and 2 Grade View: Grade I Tube type: Oral Tube size: 7.5 mm Number of attempts: 1 Airway Equipment and Method: Stylet and Oral airway Placement Confirmation: ETT inserted through vocal cords under direct vision, positive ETCO2 and breath sounds checked- equal and bilateral Secured at: 22 cm Tube secured with: Tape Dental Injury: Teeth and Oropharynx as per pre-operative assessment

## 2022-08-30 ENCOUNTER — Encounter (HOSPITAL_COMMUNITY): Payer: Self-pay | Admitting: Psychiatry

## 2022-08-30 ENCOUNTER — Telehealth: Payer: Self-pay | Admitting: *Deleted

## 2022-08-30 NOTE — Addendum Note (Signed)
Addendum  created 08/30/22 0833 by Florene Route, CRNA   Intraprocedure Staff edited

## 2022-08-30 NOTE — Telephone Encounter (Signed)
Spoke with Evelyn Crawford this morning. She states she is eating, drinking and urinating well. She has not had a BM yet but is passing gas. She is taking senokot as prescribed and encouraged her to drink plenty of water. She denies fever or chills. Incisions are dry and intact. She rates her pain 8/10. Her pain is controlled with tylenol and she took oxycodone yesterday only and pain level goes down to 3/10.    Instructed to call office with any fever, chills, purulent drainage, uncontrolled pain or any other questions or concerns. Patient verbalizes understanding.   Pt aware of post op appointments as well as the office number [phone] and after hours number [phone] to call if she has any questions or concerns

## 2022-09-01 ENCOUNTER — Telehealth: Payer: Self-pay | Admitting: *Deleted

## 2022-09-01 NOTE — Telephone Encounter (Signed)
Spoke with Ms. Evelyn Crawford who called the office to ask when she could remove the patch-dressing from the middle of her stomach.  Advised patient she could remove the dressing 6 days post op, and she could get the dressing wet and remove it in the shower, but continue to keep the area clean and dry and call the office with any signs of infection such as redness, swelling, drainage and or fever/chills. Pt verbalized understanding.

## 2022-09-06 ENCOUNTER — Ambulatory Visit: Payer: Medicaid Other | Attending: Nurse Practitioner | Admitting: Nurse Practitioner

## 2022-09-06 ENCOUNTER — Encounter: Payer: Self-pay | Admitting: Nurse Practitioner

## 2022-09-06 VITALS — BP 94/64 | HR 71 | Ht 66.0 in | Wt 154.0 lb

## 2022-09-06 DIAGNOSIS — Z23 Encounter for immunization: Secondary | ICD-10-CM | POA: Diagnosis not present

## 2022-09-06 DIAGNOSIS — D259 Leiomyoma of uterus, unspecified: Secondary | ICD-10-CM | POA: Diagnosis not present

## 2022-09-06 DIAGNOSIS — I10 Essential (primary) hypertension: Secondary | ICD-10-CM

## 2022-09-06 DIAGNOSIS — E876 Hypokalemia: Secondary | ICD-10-CM | POA: Diagnosis not present

## 2022-09-06 DIAGNOSIS — R1900 Intra-abdominal and pelvic swelling, mass and lump, unspecified site: Secondary | ICD-10-CM | POA: Diagnosis not present

## 2022-09-06 MED ORDER — IBUPROFEN 800 MG PO TABS
800.0000 mg | ORAL_TABLET | Freq: Three times a day (TID) | ORAL | 1 refills | Status: DC | PRN
Start: 2022-09-06 — End: 2023-12-28

## 2022-09-06 NOTE — Progress Notes (Signed)
Assessment & Plan:  Arnett was seen today for medical management of chronic issues.  Diagnoses and all orders for this visit:  Primary hypertension Well controlled. Not currently taking any antihypertensives.  Pelvic mass in female -     ibuprofen (ADVIL) 800 MG tablet; Take 1 tablet (800 mg total) by mouth every 8 (eight) hours as needed for moderate pain. For AFTER surgery only  Uterine leiomyoma, unspecified location -     ibuprofen (ADVIL) 800 MG tablet; Take 1 tablet (800 mg total) by mouth every 8 (eight) hours as needed for moderate pain. For AFTER surgery only  Hypokalemia -     Potassium  Need for Tdap vaccination -     Tdap vaccine greater than or equal to 7yo IM    Patient has been counseled on age-appropriate routine health concerns for screening and prevention. These are reviewed and up-to-date. Referrals have been placed accordingly. Immunizations are up-to-date or declined.    Subjective:   Chief Complaint  Patient presents with   Medical Management of Chronic Issues   HPI Evelyn Crawford 62 y.o. female presents to office today for follow up to HTN   She is POD9 She is S/P hysterectomy  with B/L salpingo oophorectomy for pelvic mass. Requesting pain medication> I have recommended she follow up with surgery for anything stronger than prescription ibuprofen which I am prescribing for her today.    Blood pressure is low normal. She is not currently taking any antihypertensives.  BP Readings from Last 3 Encounters:  09/13/22 120/70  09/11/22 129/82  09/06/22 94/64       Review of Systems  Constitutional:  Negative for fever, malaise/fatigue and weight loss.  HENT: Negative.  Negative for nosebleeds.   Eyes: Negative.  Negative for blurred vision, double vision and photophobia.  Respiratory: Negative.  Negative for cough and shortness of breath.   Cardiovascular: Negative.  Negative for chest pain, palpitations and leg swelling.  Gastrointestinal:   Positive for abdominal pain and constipation. Negative for blood in stool, diarrhea, heartburn, melena, nausea and vomiting.  Musculoskeletal: Negative.  Negative for myalgias.  Neurological: Negative.  Negative for dizziness, focal weakness, seizures and headaches.  Psychiatric/Behavioral: Negative.  Negative for suicidal ideas.     Past Medical History:  Diagnosis Date   Alcohol abuse    Hypertension    Iron deficiency anemia    Thrombocytopenia (HCC)    Tobacco abuse    Uterine fibroid     Past Surgical History:  Procedure Laterality Date   BACK SURGERY     CESAREAN SECTION     3X   CYSTOSCOPY  08/29/2022   Procedure: CYSTOSCOPY;  Surgeon: Clide Cliff, MD;  Location: [facility];  Service: Gynecology;;   LAPAROTOMY WITH STAGING N/A 08/29/2022   Procedure: MINI LAPAROTOMY;  Surgeon: Clide Cliff, MD;  Location: [facility];  Service: Gynecology;  Laterality: N/A;   ROBOTIC ASSISTED TOTAL HYSTERECTOMY WITH BILATERAL SALPINGO OOPHERECTOMY Bilateral 08/29/2022   Procedure: XI ROBOTIC ASSISTED TOTAL HYSTERECTOMY WITH BILATERAL SALPINGO OOPHORECTOMY GREATER THAN 250GRAMS;  Surgeon: Clide Cliff, MD;  Location: [facility];  Service: Gynecology;  Laterality: Bilateral;    Family History  Problem Relation Age of Onset   Fibroids Mother    Heart attack Mother    Brain cancer Father    Fibroids Sister    Colon cancer Neg Hx    Stomach cancer Neg Hx    Esophageal cancer Neg Hx    Breast cancer Neg Hx  Ovarian cancer Neg Hx    Endometrial cancer Neg Hx     Social History Reviewed with no changes to be made today.   Outpatient Medications Prior to Visit  Medication Sig Dispense Refill   atorvastatin (LIPITOR) 20 MG tablet Take 1 tablet (20 mg total) by mouth daily. 90 tablet 1   Multiple Vitamin (MULTIVITAMIN WITH MINERALS) TABS tablet Take 2 tablets by mouth daily.     senna-docusate (SENOKOT-S) 8.6-50 MG tablet Take 2 tablets by mouth at bedtime. For AFTER surgery, do not take  if having diarrhea 30 tablet 0   hydrochlorothiazide (HYDRODIURIL) 25 MG tablet Take 1 tablet (25 mg total) by mouth daily. 90 tablet 1   ibuprofen (ADVIL) 800 MG tablet Take 1 tablet (800 mg total) by mouth every 8 (eight) hours as needed for moderate pain. For AFTER surgery only (Patient not taking: Reported on 09/06/2022) 30 tablet 0   oxyCODONE (OXY IR/ROXICODONE) 5 MG immediate release tablet Take 1 tablet (5 mg total) by mouth every 4 (four) hours as needed for severe pain. For AFTER surgery only, do not take and drive (Patient not taking: Reported on 09/06/2022) 10 tablet 0   potassium chloride SA (KLOR-CON M) 20 MEQ tablet Take 2 tablets (40 mEq total) by mouth once for 1 dose. Take tonight before surgery tomorrow 2 tablet 0   No facility-administered medications prior to visit.    Allergies  Allergen Reactions   Ibuprofen Nausea Only       Objective:    BP 94/64 (BP Location: Left Arm, Patient Position: Sitting, Cuff Size: Normal)   Pulse 71   Ht 5\' 6"  (1.676 m)   Wt 154 lb (69.9 kg)   LMP 09/17/2010   SpO2 100%   BMI 24.86 kg/m  Wt Readings from Last 3 Encounters:  09/13/22 158 lb (71.7 kg)  09/11/22 156 lb 8 oz (71 kg)  09/06/22 154 lb (69.9 kg)    Physical Exam Vitals and nursing note reviewed.  Constitutional:      Appearance: She is well-developed.  HENT:     Head: Normocephalic and atraumatic.  Cardiovascular:     Rate and Rhythm: Normal rate and regular rhythm.     Heart sounds: Normal heart sounds. No murmur heard.    No friction rub. No gallop.  Pulmonary:     Effort: Pulmonary effort is normal. No tachypnea or respiratory distress.     Breath sounds: Normal breath sounds. No decreased breath sounds, wheezing, rhonchi or rales.  Chest:     Chest wall: No tenderness.  Abdominal:     General: Bowel sounds are normal.     Palpations: Abdomen is soft.  Musculoskeletal:        General: Normal range of motion.     Cervical back: Normal range of motion.   Skin:    General: Skin is warm and dry.  Neurological:     Mental Status: She is alert and oriented to person, place, and time.     Coordination: Coordination normal.  Psychiatric:        Behavior: Behavior normal. Behavior is cooperative.        Thought Content: Thought content normal.        Judgment: Judgment normal.          Patient has been counseled extensively about nutrition and exercise as well as the importance of adherence with medications and regular follow-up. The patient was given clear instructions to go to ER or return to medical  center if symptoms don't improve, worsen or new problems develop. The patient verbalized understanding.   Follow-up: Return in about 9 days (around 09/15/2022) for virtual 1110 or 130 BP CHECK may DB.   Claiborne Rigg, FNP-BC [facility] [place], [place] [phone]   09/21/2022, 10:37 PM

## 2022-09-10 ENCOUNTER — Other Ambulatory Visit: Payer: Self-pay | Admitting: Nurse Practitioner

## 2022-09-10 DIAGNOSIS — E876 Hypokalemia: Secondary | ICD-10-CM

## 2022-09-10 MED ORDER — POTASSIUM CHLORIDE CRYS ER 20 MEQ PO TBCR
20.0000 meq | EXTENDED_RELEASE_TABLET | Freq: Every day | ORAL | 1 refills | Status: DC
Start: 2022-09-10 — End: 2023-03-08

## 2022-09-11 ENCOUNTER — Encounter: Payer: Self-pay | Admitting: Psychiatry

## 2022-09-11 ENCOUNTER — Other Ambulatory Visit: Payer: Self-pay

## 2022-09-11 ENCOUNTER — Inpatient Hospital Stay: Payer: PRIVATE HEALTH INSURANCE | Attending: Psychiatry | Admitting: Psychiatry

## 2022-09-11 ENCOUNTER — Telehealth: Payer: Self-pay | Admitting: *Deleted

## 2022-09-11 VITALS — BP 129/82 | HR 80 | Temp 98.1°F | Resp 18 | Ht 66.0 in | Wt 156.5 lb

## 2022-09-11 DIAGNOSIS — R1900 Intra-abdominal and pelvic swelling, mass and lump, unspecified site: Secondary | ICD-10-CM

## 2022-09-11 DIAGNOSIS — D271 Benign neoplasm of left ovary: Secondary | ICD-10-CM

## 2022-09-11 DIAGNOSIS — D414 Neoplasm of uncertain behavior of bladder: Secondary | ICD-10-CM

## 2022-09-11 DIAGNOSIS — Z90722 Acquired absence of ovaries, bilateral: Secondary | ICD-10-CM

## 2022-09-11 DIAGNOSIS — D259 Leiomyoma of uterus, unspecified: Secondary | ICD-10-CM

## 2022-09-11 DIAGNOSIS — Z90710 Acquired absence of both cervix and uterus: Secondary | ICD-10-CM

## 2022-09-11 NOTE — Patient Instructions (Addendum)
It was a pleasure to see you in clinic today. - Nothing in the vagina until 8 weeks after surgery. - No lifting more than 10lbs until 6 weeks after surgery. Resume full activity after 6 weeks.  - Okay to resume routine ob/gyn care - Referral to urology  Thank you very much for allowing me to provide care for you today.  I appreciate your confidence in choosing our Gynecologic Oncology team at [facility].  If you have any questions about your visit today please call our office or send [place] a MyChart message and we will get back to you as soon as possible.

## 2022-09-11 NOTE — Telephone Encounter (Signed)
Patient returned call for results and notified:  Potassium low normal. I recommend you continue potassium for now. Will resend script to pharmacy.

## 2022-09-11 NOTE — Progress Notes (Signed)
Gynecologic Oncology Return Clinic Visit  Date of Service: 09/11/2022 Referring Provider: Vivien Rossetti, MD   Assessment & Plan: Evelyn Crawford is a 62 y.o. woman with a left ovarian mass and pelvic pain who s/p RA-TLH, BSO, lysis of adhesions, cystoscopy, minilap for specimen retrieval on 08/29/22 with final pathology showing benign Evelyn Crawford tumor and uterine fibroids.  Postop: - Pt recovering well from surgery and healing appropriately postoperatively - Intraoperative findings and pathology results reviewed. - Ongoing postoperative expectations and precautions reviewed. Continue with no lifting >10lbs through 6 weeks postoperatively - Pt works as a Biomedical scientist. Okay to return to work when school starts (8/26) with no lifting until 6 weeks postop. - No history of high grade cervical dysplasia. Can d/c pap smear screening. - Okay to return to routine care.  Bladder polyp: -Reviewed with patient that a cystoscopy was performed intraoperatively given dense adhesions to the bladder that required lysis during surgery. No injury noted.  - A polypoid structure was noted in the bladder.  A picture was obtained and was supposed to be saved to the chart but does not appear to have occurred unfortunately. - Recommend referral to urology for workup as needed.   RTC PRN.  Clide Cliff, MD Gynecologic Oncology   Medical Decision Making I personally spent  TOTAL 20 minutes face-to-face and non-face-to-face in the care of this patient, which includes all pre, intra, and post visit time on the date of service.   ----------------------- Reason for Visit: Postop   Interval History: Pt reports that she is recovering well from surgery. She is using tylenol PRN for pain. She is eating and drinking well. She is voiding without issue and having regular bowel movements. Some brown discharge on occasion.   Past Medical/Surgical History: Past Medical History:  Diagnosis Date   Alcohol abuse     Hypertension    Iron deficiency anemia    Thrombocytopenia (HCC)    Tobacco abuse    Uterine fibroid     Past Surgical History:  Procedure Laterality Date   BACK SURGERY     CESAREAN SECTION     3X   CYSTOSCOPY  08/29/2022   Procedure: CYSTOSCOPY;  Surgeon: Clide Cliff, MD;  Location: [facility];  Service: Gynecology;;   LAPAROTOMY WITH STAGING N/A 08/29/2022   Procedure: MINI LAPAROTOMY;  Surgeon: Clide Cliff, MD;  Location: [facility];  Service: Gynecology;  Laterality: N/A;   ROBOTIC ASSISTED TOTAL HYSTERECTOMY WITH BILATERAL SALPINGO OOPHERECTOMY Bilateral 08/29/2022   Procedure: XI ROBOTIC ASSISTED TOTAL HYSTERECTOMY WITH BILATERAL SALPINGO OOPHORECTOMY GREATER THAN 250GRAMS;  Surgeon: Clide Cliff, MD;  Location: [facility];  Service: Gynecology;  Laterality: Bilateral;    Family History  Problem Relation Age of Onset   Fibroids Mother    Fibroids Sister    Colon cancer Neg Hx    Stomach cancer Neg Hx    Esophageal cancer Neg Hx    Breast cancer Neg Hx    Ovarian cancer Neg Hx    Endometrial cancer Neg Hx     Social History   Socioeconomic History   Marital status: Single    Spouse name: Not on file   Number of children: 0   Years of education: Not on file   Highest education level: Not on file  Occupational History   Occupation: Biomedical scientist  Tobacco Use   Smoking status: Every Day    Current packs/day: 0.25    Average packs/day: 0.3 packs/day for 23.0 years (5.8 ttl pk-yrs)  Types: Cigarettes   Smokeless tobacco: Never  Vaping Use   Vaping status: Never Used  Substance and Sexual Activity   Alcohol use: Not Currently    Comment: daily 24 ounce beer every night drink liquor on weekend   Drug use: No   Sexual activity: Yes    Birth control/protection: Post-menopausal  Other Topics Concern   Not on file  Social History Narrative   Patient needs to submit further paperwork to complete   Evelyn Crawford  November 29, 2009 11:51 AM      Unemployed    Previously worked at a school for 11 years, 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  Lives with grown daughter.  Pt is single, sexually active with men only.          Social Determinants of Health   Financial Resource Strain: Low Risk  (07/25/2022)   Received from [facility], [facility]   Overall Financial Resource Strain (CARDIA)    Difficulty of Paying Living Expenses: Not hard at all  Food Insecurity: No Food Insecurity (07/25/2022)   Received from [facility], [facility]   Hunger Vital Sign    Worried About Running Out of Food in the Last Year: Never true    Ran Out of Food in the Last Year: Never true  Transportation Needs: No Transportation Needs (07/25/2022)   Received from [facility], [facility]   PRAPARE - Transportation    Lack of Transportation (Medical): No    Lack of Transportation (Non-Medical): No  Physical Activity: Not on file  Stress: No Stress Concern Present (08/10/2020)   Received from [facility], [facility] of Occupational Health - Occupational Stress Questionnaire    Feeling of Stress : Not at all  Social Connections: Unknown (06/07/2021)   Received from [facility], [facility]   Social Network    Social Network: Not on file    Current Medications:  Current Outpatient Medications:    atorvastatin (LIPITOR) 20 MG tablet, Take 1 tablet (20 mg total) by mouth daily., Disp: 90 tablet, Rfl: 1   ibuprofen (ADVIL) 800 MG tablet, Take 1 tablet (800 mg total) by mouth every 8 (eight) hours as needed for moderate pain. For AFTER surgery only, Disp: 60 tablet, Rfl: 1   Multiple Vitamin (MULTIVITAMIN WITH MINERALS) TABS tablet, Take 2 tablets by mouth daily., Disp: , Rfl:    potassium chloride SA (KLOR-CON M) 20 MEQ tablet, Take 1 tablet (20 mEq total) by mouth daily., Disp: 90 tablet, Rfl: 1   senna-docusate (SENOKOT-S) 8.6-50 MG tablet, Take 2 tablets by mouth at bedtime. For AFTER surgery, do not take if having diarrhea,  Disp: 30 tablet, Rfl: 0  Review of Symptoms: Complete 10-system review is negative except as above in Interval History.  Physical Exam: BP 129/82 (BP Location: Left Arm, Patient Position: Sitting)   Pulse 80   Temp 98.1 F (36.7 C)   Resp 18   Ht 5\' 6"  (1.676 m)   Wt 156 lb 8 oz (71 kg)   LMP 09/17/2010   SpO2 100%   BMI 25.26 kg/m  General: Alert, oriented, no acute distress. HEENT: Normocephalic, atraumatic. Neck symmetric without masses. Sclera anicteric.  Chest: Normal work of breathing. Clear to auscultation bilaterally.   Cardiovascular: Regular rate and rhythm, no murmurs. Abdomen: Soft, nontender.  Normoactive bowel sounds.  No masses appreciated.  Well-healing incisions with glue. Extremities: Grossly normal range of motion.  Warm, well perfused.  No edema bilaterally. Skin: No rashes or lesions noted. GU: Normal appearing external genitalia without erythema, excoriation, or lesions.  Speculum exam reveals well healing vaginal cuff.  Bimanual exam reveals intact vaginal cuff. Exam chaperoned by Warner Mccreedy, NP   Laboratory & Radiologic Studies: Surgical pathology (08/29/22): FINAL MICROSCOPIC DIAGNOSIS:   A. 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  Benign fallopian tube.  Negative for borderline change or malignancy.  See comment.   B. RIGHT FALLOPIAN TUBE AND OVARY, RESECTION:  Unremarkable ovary and fallopian tube.  Negative for endometriosis or malignancy.   C. UTERUS AND CERVIX, RESECTION:  Cervix     Low-grade squamous intraepithelial lesion, CIN-1      Erosive cervicitis      Margins negative  Endometrium     Inactive      Benign endometrial polyp      Negative for hyperplasia or carcinoma  Myometrium     Leiomyomata      Focal adenomyosis      Negative for malignancy   COMMENT:  A. The left ovarian mass has features of a benign Brenner tumor with  abundant spindle cell, fibroma like stroma.   Cytology  (08/29/22): FINAL MICROSCOPIC DIAGNOSIS:  - No malignant cells identified

## 2022-09-13 ENCOUNTER — Telehealth: Payer: Self-pay

## 2022-09-13 ENCOUNTER — Encounter: Payer: Self-pay | Admitting: Internal Medicine

## 2022-09-13 ENCOUNTER — Ambulatory Visit: Payer: PRIVATE HEALTH INSURANCE | Admitting: Internal Medicine

## 2022-09-13 VITALS — BP 120/70 | HR 93 | Ht 67.0 in | Wt 158.0 lb

## 2022-09-13 DIAGNOSIS — F1010 Alcohol abuse, uncomplicated: Secondary | ICD-10-CM | POA: Diagnosis not present

## 2022-09-13 DIAGNOSIS — F17210 Nicotine dependence, cigarettes, uncomplicated: Secondary | ICD-10-CM | POA: Diagnosis not present

## 2022-09-13 DIAGNOSIS — K862 Cyst of pancreas: Secondary | ICD-10-CM

## 2022-09-13 DIAGNOSIS — R935 Abnormal findings on diagnostic imaging of other abdominal regions, including retroperitoneum: Secondary | ICD-10-CM

## 2022-09-13 DIAGNOSIS — R1013 Epigastric pain: Secondary | ICD-10-CM

## 2022-09-13 DIAGNOSIS — K859 Acute pancreatitis without necrosis or infection, unspecified: Secondary | ICD-10-CM

## 2022-09-13 NOTE — Patient Instructions (Signed)
Please follow up in one year

## 2022-09-13 NOTE — Progress Notes (Signed)
HISTORY OF PRESENT ILLNESS:  Evelyn Crawford is a 62 y.o. female, [place] [employer], with a history of hypertension and chronic alcohol abuse who was evaluated in this office July 11, 2022 regarding an episode of acute pancreatitis.  See that dictation for details.  She was advised to avoid all alcohol indefinitely, avoid fatty foods, and scheduled for MRI MRCP.  MRI/MRCP was performed August 08, 2022.  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  Follow-up MRI in 1 year recommended.  She presents today for follow-up.  She recently underwent robotic assisted total hysterectomy with bilateral salpingo-oophorectomy August 29, 2022.  Some abdominal soreness.  Otherwise, she tells me that she has been feeling well.  Unfortunately, she tells me that she drank alcohol over the weekend.  She asked me if she can have alcohol periodically.  She continues to smoke.  She last had colonoscopy elsewhere in 2011.  We discussed colon cancer screening strategies today.  She wants to wait.    REVIEW OF SYSTEMS:  All non-GI ROS negative unless otherwise stated in the HPI.  Past Medical History:  Diagnosis Date   Alcohol abuse    Hypertension    Iron deficiency anemia    Thrombocytopenia (HCC)    Tobacco abuse    Uterine fibroid     Past Surgical History:  Procedure Laterality Date   BACK SURGERY     CESAREAN SECTION     3X   CYSTOSCOPY  08/29/2022   Procedure: CYSTOSCOPY;  Surgeon: Clide Cliff, MD;  Location: [facility];  Service: Gynecology;;   LAPAROTOMY WITH STAGING N/A 08/29/2022   Procedure: MINI LAPAROTOMY;  Surgeon: Clide Cliff, MD;  Location: [facility];  Service: Gynecology;  Laterality: N/A;   ROBOTIC ASSISTED TOTAL HYSTERECTOMY WITH BILATERAL SALPINGO OOPHERECTOMY Bilateral 08/29/2022   Procedure: XI ROBOTIC ASSISTED TOTAL HYSTERECTOMY WITH BILATERAL  SALPINGO OOPHORECTOMY GREATER THAN 250GRAMS;  Surgeon: Clide Cliff, MD;  Location: [facility];  Service: Gynecology;  Laterality: Bilateral;    Social History ZAMARIYA BEEGHLY  reports that she has been smoking cigarettes. She has a 5.8 pack-year smoking history. She has never used smokeless tobacco. She reports that she does not currently use alcohol. She reports that she does not use drugs.  family history includes Brain cancer in her father; Fibroids in her mother and sister; Heart attack in her mother.  Allergies  Allergen Reactions   Ibuprofen Nausea Only       PHYSICAL EXAMINATION: Vital signs: BP 120/70   Pulse 93   Ht 5\' 7"  (1.702 m)   Wt 158 lb (71.7 kg)   LMP 09/17/2010   BMI 24.75 kg/m   Constitutional: generally well-appearing, no acute distress Psychiatric: alert and oriented x3, cooperative Eyes: extraocular movements intact, anicteric, conjunctiva pink Mouth: oral pharynx moist, no lesions Neck: supple no lymphadenopathy Cardiovascular: heart regular rate and rhythm, no murmur Lungs: clear to auscultation bilaterally Abdomen: soft, nontender, nondistended, no obvious ascites, no peritoneal signs, normal bowel sounds, no organomegaly.  Surgical incisions well-healing Rectal: Omitted Extremities: no clubbing, cyanosis, or lower extremity edema bilaterally Skin: no lesions on visible extremities Neuro: No focal deficits.  Cranial nerves intact  ASSESSMENT:  1.  Acute alcoholic pancreatitis.  Resolved. 2.  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  Chronic alcohol abuse 4.  Chronic tobacco abuse 5.  Colon cancer screening.  Negative colonoscopy 2011   PLAN:  1.  Avoid all alcohol indefinitely 2.  Stop smoking 3.  Repeat MRI/MRCP in 1 year from previous exam.  She is aware 4.  She will consider colon cancer screening strategies 5.  Otherwise, follow-up in 1 year.  Contact the office in the interim for any questions or  problems. Total time of 30 minutes was spent preparing to see the patient, obtaining interval history, performing medically appropriate physical exam, counseling educating the patient regarding above listed issues, ordering radiology study, establishing follow-up parameters, and documenting clinical information in the health record

## 2022-09-13 NOTE — Telephone Encounter (Signed)
Faxed over referral to [facility].  Faxed to [phone].

## 2022-09-21 ENCOUNTER — Encounter: Payer: Self-pay | Admitting: Nurse Practitioner

## 2022-11-11 ENCOUNTER — Emergency Department (HOSPITAL_COMMUNITY): Payer: No Typology Code available for payment source

## 2022-11-11 ENCOUNTER — Emergency Department (HOSPITAL_COMMUNITY)
Admission: EM | Admit: 2022-11-11 | Discharge: 2022-11-11 | Disposition: A | Discharge status: Home / Self Care | Payer: No Typology Code available for payment source | Attending: Emergency Medicine | Admitting: Emergency Medicine

## 2022-11-11 ENCOUNTER — Other Ambulatory Visit: Payer: Self-pay

## 2022-11-11 ENCOUNTER — Encounter (HOSPITAL_COMMUNITY): Payer: Self-pay

## 2022-11-11 DIAGNOSIS — Z79899 Other long term (current) drug therapy: Secondary | ICD-10-CM | POA: Diagnosis not present

## 2022-11-11 DIAGNOSIS — R110 Nausea: Secondary | ICD-10-CM | POA: Insufficient documentation

## 2022-11-11 DIAGNOSIS — R1013 Epigastric pain: Secondary | ICD-10-CM | POA: Insufficient documentation

## 2022-11-11 DIAGNOSIS — I10 Essential (primary) hypertension: Secondary | ICD-10-CM | POA: Insufficient documentation

## 2022-11-11 DIAGNOSIS — R1012 Left upper quadrant pain: Secondary | ICD-10-CM | POA: Insufficient documentation

## 2022-11-11 LAB — LACTIC ACID, PLASMA: Lactic Acid, Venous: 0.9 mmol/L (ref 0.5–1.9)

## 2022-11-11 LAB — URINALYSIS, ROUTINE W REFLEX MICROSCOPIC
Bilirubin Urine: NEGATIVE
Glucose, UA: NEGATIVE mg/dL
Hgb urine dipstick: NEGATIVE
Ketones, ur: 5 mg/dL — AB
Nitrite: NEGATIVE
Protein, ur: NEGATIVE mg/dL
Specific Gravity, Urine: 1.019 (ref 1.005–1.030)
pH: 6 (ref 5.0–8.0)

## 2022-11-11 LAB — COMPREHENSIVE METABOLIC PANEL
ALT: 20 U/L (ref 0–44)
AST: 27 U/L (ref 15–41)
Albumin: 4.4 g/dL (ref 3.5–5.0)
Alkaline Phosphatase: 79 U/L (ref 38–126)
Anion gap: 9 (ref 5–15)
BUN: 10 mg/dL (ref 8–23)
CO2: 24 mmol/L (ref 22–32)
Calcium: 10.1 mg/dL (ref 8.9–10.3)
Chloride: 105 mmol/L (ref 98–111)
Creatinine, Ser: 0.65 mg/dL (ref 0.44–1.00)
GFR, Estimated: >60 mL/min (ref >60)
Glucose, Bld: 98 mg/dL (ref 70–99)
Potassium: 3.9 mmol/L (ref 3.5–5.1)
Sodium: 138 mmol/L (ref 135–145)
Total Bilirubin: 0.7 mg/dL (ref 0.3–1.2)
Total Protein: 8.2 g/dL — ABNORMAL HIGH (ref 6.5–8.1)

## 2022-11-11 LAB — CBC
HCT: 42.1 % (ref 36.0–46.0)
Hemoglobin: 13.4 g/dL (ref 12.0–15.0)
MCH: 30.6 pg (ref 26.0–34.0)
MCHC: 31.8 g/dL (ref 30.0–36.0)
MCV: 96.1 fL (ref 80.0–100.0)
Platelets: 174 10*3/uL (ref 150–400)
RBC: 4.38 MIL/uL (ref 3.87–5.11)
RDW: 13.3 % (ref 11.5–15.5)
WBC: 7.8 10*3/uL (ref 4.0–10.5)
nRBC: 0 % (ref 0.0–0.2)

## 2022-11-11 LAB — LIPASE, BLOOD: Lipase: 26 U/L (ref 11–51)

## 2022-11-11 MED ORDER — HYDROMORPHONE HCL 1 MG/ML IJ SOLN
1.0000 mg | Freq: Once | INTRAMUSCULAR | Status: AC
  Administered 2022-11-11: 1 mg via INTRAVENOUS
  Filled 2022-11-11: qty 1

## 2022-11-11 MED ORDER — OMEPRAZOLE 20 MG PO CPDR: 20.0000 mg | DELAYED_RELEASE_CAPSULE | Freq: Every day | ORAL | 0 refills | Status: DC

## 2022-11-11 MED ORDER — ONDANSETRON HCL 4 MG/2ML IJ SOLN
4.0000 mg | Freq: Once | INTRAMUSCULAR | Status: AC
  Administered 2022-11-11: 4 mg via INTRAVENOUS
  Filled 2022-11-11: qty 2

## 2022-11-11 MED ORDER — IOHEXOL 300 MG/ML  SOLN
100.0000 mL | Freq: Once | INTRAMUSCULAR | Status: AC | PRN
  Administered 2022-11-11: 100 mL via INTRAVENOUS

## 2022-11-11 MED ORDER — SODIUM CHLORIDE 0.9 % IV BOLUS
1000.0000 mL | Freq: Once | INTRAVENOUS | Status: AC
  Administered 2022-11-11: 1000 mL via INTRAVENOUS

## 2022-11-11 MED ORDER — SUCRALFATE 1 G PO TABS: 1.0000 g | ORAL_TABLET | Freq: Three times a day (TID) | ORAL | 0 refills | Status: DC

## 2022-11-11 MED ORDER — ONDANSETRON 4 MG PO TBDP: 4.0000 mg | ORAL_TABLET | Freq: Three times a day (TID) | ORAL | 0 refills | Status: DC | PRN

## 2022-11-11 NOTE — ED Provider Notes (Signed)
[place] EMERGENCY DEPARTMENT AT [facility] Provider Note   CSN: [id number] Arrival date & time: 11/11/22  1620     History  Chief Complaint  Patient presents with   Abdominal Pain    Evelyn Crawford is a 62 y.o. female.  The history is provided by the patient and medical records. No language interpreter was used.  Abdominal Pain Pain location:  Epigastric and LUQ Pain quality: aching and cramping   Pain radiates to:  Does not radiate Pain severity:  Severe Onset quality:  Gradual Duration:  2 days (2) Timing:  Constant Progression:  Unchanged Chronicity:  Recurrent Context: alcohol use   Relieved by:  Nothing Worsened by:  Nothing Ineffective treatments:  None tried Associated symptoms: nausea   Associated symptoms: no chest pain, no chills, no constipation, no cough, no diarrhea, no dysuria, no fatigue, no fever and no vomiting        Home Medications Prior to Admission medications   Medication Sig Start Date End Date Taking? Authorizing Provider  atorvastatin (LIPITOR) 20 MG tablet Take 1 tablet (20 mg total) by mouth daily. 06/06/22   Claiborne Rigg, NP  ibuprofen (ADVIL) 800 MG tablet Take 1 tablet (800 mg total) by mouth every 8 (eight) hours as needed for moderate pain. For AFTER surgery only 09/06/22   Claiborne Rigg, NP  Multiple Vitamin (MULTIVITAMIN WITH MINERALS) TABS tablet Take 2 tablets by mouth daily.    [provider]  potassium chloride SA (KLOR-CON M) 20 MEQ tablet Take 1 tablet (20 mEq total) by mouth daily. 09/10/22   Claiborne Rigg, NP  senna-docusate (SENOKOT-S) 8.6-50 MG tablet Take 2 tablets by mouth at bedtime. For AFTER surgery, do not take if having diarrhea 08/21/22   Warner Mccreedy D, NP      Allergies    Ibuprofen    Review of Systems   Review of Systems  Constitutional:  Negative for chills, fatigue and fever.  HENT:  Negative for congestion.   Respiratory:  Negative for cough and chest tightness.    Cardiovascular:  Negative for chest pain.  Gastrointestinal:  Positive for abdominal pain and nausea. Negative for constipation, diarrhea and vomiting.  Genitourinary:  Negative for dysuria and flank pain.  Musculoskeletal:  Negative for back pain and neck pain.  Skin:  Negative for rash.  Neurological:  Negative for light-headedness and headaches.  Psychiatric/Behavioral:  Negative for agitation and confusion.   All other systems reviewed and are negative.   Physical Exam Updated Vital Signs BP (!) 161/89 (BP Location: Left Arm)   Pulse 68   Temp 98.4 F (36.9 C) (Oral)   Resp 16   Ht 5\' 6"  (1.676 m)   Wt 69.9 kg   LMP 09/17/2010   SpO2 100%   BMI 24.86 kg/m  Physical Exam Vitals and nursing note reviewed.  Constitutional:      General: She is not in acute distress.    Appearance: She is well-developed. She is not ill-appearing, toxic-appearing or diaphoretic.  HENT:     Head: Normocephalic and atraumatic.  Eyes:     Conjunctiva/sclera: Conjunctivae normal.  Cardiovascular:     Rate and Rhythm: Normal rate and regular rhythm.     Heart sounds: No murmur heard. Pulmonary:     Effort: Pulmonary effort is normal. No respiratory distress.     Breath sounds: Normal breath sounds. No wheezing, rhonchi or rales.  Chest:     Chest wall: No tenderness.  Abdominal:  General: Abdomen is flat. Bowel sounds are normal.     Palpations: Abdomen is soft.     Tenderness: There is abdominal tenderness in the epigastric area and left upper quadrant. There is no right CVA tenderness, left CVA tenderness, guarding or rebound.    Musculoskeletal:        General: No swelling.     Cervical back: Neck supple.  Skin:    General: Skin is warm and dry.     Capillary Refill: Capillary refill takes less than 2 seconds.  Neurological:     Mental Status: She is alert.  Psychiatric:        Mood and Affect: Mood normal.     ED Results / Procedures / Treatments   Labs (all labs  ordered are listed, but only abnormal results are displayed) Labs Reviewed  COMPREHENSIVE METABOLIC PANEL - Abnormal; Notable for the following components:      Result Value   Total Protein 8.2 (*)    All other components within normal limits  URINALYSIS, ROUTINE W REFLEX MICROSCOPIC - Abnormal; Notable for the following components:   Ketones, ur 5 (*)    Leukocytes,Ua SMALL (*)    Bacteria, UA RARE (*)    All other components within normal limits  LIPASE, BLOOD  CBC  LACTIC ACID, PLASMA    EKG None  Radiology CT ABDOMEN PELVIS W CONTRAST  Result Date: 11/11/2022 CLINICAL DATA:  Epigastric pain History of pancreatitis with abnormalities, recurrent right upper quadrant epigastric abdominal pain with nausea. Rule out complication and pancreatitis. EXAM: CT ABDOMEN AND PELVIS WITH CONTRAST TECHNIQUE: Multidetector CT imaging of the abdomen and pelvis was performed using the standard protocol following bolus administration of intravenous contrast. RADIATION DOSE REDUCTION: This exam was performed according to the departmental dose-optimization program which includes automated exposure control, adjustment of the mA and/or kV according to patient size and/or use of iterative reconstruction technique. CONTRAST:  OMNIPAQUE IOHEXOL 300 MG/ML  SOLN COMPARISON:  MRI abdomen 08/08/2022, CT abdomen pelvis 07/06/2022 FINDINGS: Lower chest: No acute abnormality. Hepatobiliary: No focal liver abnormality. No gallstones, gallbladder wall thickening, or pericholecystic fluid. No biliary dilatation. Pancreas: Stable 0.7 cm hypodensity within the proximal pancreatic body. Normal pancreatic contour. No surrounding inflammatory changes. No main pancreatic ductal dilatation. Spleen: Normal in size without focal abnormality. Adrenals/Urinary Tract: There is a stable 2.7 x 2.6 cm left adrenal gland nodule within density of 119 Hounsfield units-consistent with previously evaluated left adrenal gland adenoma - no  further follow-up indicated. Bilateral kidneys enhance symmetrically. No hydronephrosis. No hydroureter. The urinary bladder is unremarkable. On delayed imaging, there is no urothelial wall thickening and there are no filling defects in the opacified portions of the bilateral collecting systems or ureters. Stomach/Bowel: Stomach is within normal limits. No evidence of bowel wall thickening or dilatation. Colonic diverticulosis. Appendix appears normal. Vascular/Lymphatic: No abdominal aorta or iliac aneurysm. Mild atherosclerotic plaque of the aorta and its branches. No abdominal, pelvic, or inguinal lymphadenopathy. Reproductive: Status post hysterectomy. No adnexal masses. Other: No intraperitoneal free fluid. No intraperitoneal free gas. No organized fluid collection. Musculoskeletal: No abdominal wall hernia or abnormality. No suspicious lytic or blastic osseous lesions. No acute displaced fracture. IMPRESSION: 1. No CT evidence of acute pancreatitis. 2. Stable 0.7 cm hypodensity within the proximal pancreatic body consistent with known IPMN. Finding better evaluated on MR abdomen 08/08/2022. Recommend follow-up MRI abdomen pancreatic protocol 1 year from 08/08/2022. 3. Colonic diverticulosis with no acute diverticulitis. 4. Status post hysterectomy. 5.  Aortic Atherosclerosis (ICD10-I70.0). Electronically Signed   By: Tish Frederickson M.D.   On: 11/11/2022 19:25    Procedures Procedures    Medications Ordered in ED Medications  HYDROmorphone (DILAUDID) injection 1 mg (1 mg Intravenous Given 11/11/22 1805)  ondansetron (ZOFRAN) injection 4 mg (4 mg Intravenous Given 11/11/22 1803)  sodium chloride 0.9 % bolus 1,000 mL (0 mLs Intravenous Stopped 11/11/22 2122)  iohexol (OMNIPAQUE) 300 MG/ML solution 100 mL (100 mLs Intravenous Contrast Given 11/11/22 1824)    ED Course/ Medical Decision Making/ A&P                                 Medical Decision Making Amount and/or Complexity of Data  Reviewed Labs: ordered. Radiology: ordered.  Risk Prescription drug management.    Evelyn Crawford is a 62 y.o. female with a past medical history significant for fibroids with previous hysterectomy/bilateral salpingo-oophorectomy, hypertension, high triglycerides, alcohol abuse, and previous pancreatitis who presents with nausea and upper abdominal discomfort.  According to patient, she had alcohol yesterday and had "a lot of liquor" to drink.  She thinks this set off a pancreatitis episode and says she is now quitting drinking alcohol for good.  She reports no trauma.  She denies any constipation diarrhea or urinary changes.  Denies fevers, chills, congestion, or cough.  No chest pain or shortness of breath.  No back or flank pain.  Pain is primarily in her epigastric and left upper quadrant similar to when she had pancreatitis in the past.  She has had nausea but has not gotten anything up.  On exam, lungs clear.  Chest nontender.  Abdomen is tender in the left upper quadrant and epigastric area.  Good pulses in extremities.  Patient resting but has dry mucous membranes.  Clinically I do suspect recurrent pancreatitis likely due to alcohol abuse last night.  Patient also could have a alcohol related gastritis.  Will get screening labs but due to her history of abnormal CT scan related to pancreatitis, will get a CT scan.  Will give her fluids and some medications.  Will check labs.  Anticipate reassessment after workup to determine disposition.  Patient's workup returned overall reassuring.  CT scan did not show concerning findings at this time.  Urinalysis showed no nitrites and she had no urinary symptoms so doubt UTI.  Metabolic panel reassuring.  No lactic acidosis and her lipase was normal.  CBC reassuring.  Suspect symptoms related to her recent alcohol use and patient agrees.  She will avoid alcohol.  Given likely gastritis we will give prescription for Carafate and Prilosec and some  nausea medicine.  She understood return precautions and follow-up instructions.  Patient discharged in good condition.         Final Clinical Impression(s) / ED Diagnoses Final diagnoses:  Epigastric pain  Nausea    Rx / DC Orders ED Discharge Orders          Ordered    omeprazole (PRILOSEC) 20 MG capsule  Daily        11/11/22 2216    sucralfate (CARAFATE) 1 g tablet  3 times daily with meals & bedtime        11/11/22 2216    ondansetron (ZOFRAN-ODT) 4 MG disintegrating tablet  Every 8 hours PRN        11/11/22 2216           Clinical Impression: 1. Epigastric pain  2. Nausea     Disposition: Discharge  Condition: Good  I have discussed the results, Dx and Tx plan with the pt(& family if present). He/she/they expressed understanding and agree(s) with the plan. Discharge instructions discussed at great length. Strict return precautions discussed and pt &/or family have verbalized understanding of the instructions. No further questions at time of discharge.    New Prescriptions   OMEPRAZOLE (PRILOSEC) 20 MG CAPSULE    Take 1 capsule (20 mg total) by mouth daily.   ONDANSETRON (ZOFRAN-ODT) 4 MG DISINTEGRATING TABLET    Take 1 tablet (4 mg total) by mouth every 8 (eight) hours as needed for nausea or vomiting.   SUCRALFATE (CARAFATE) 1 G TABLET    Take 1 tablet (1 g total) by mouth 4 (four) times daily -  with meals and at bedtime.    Follow Up: Claiborne Rigg, NP [address] [place] [address] [place] [address] [phone]     Gastroenterology, Deboraha Sprang [address] [place] [address] [place] [address] [phone]        Roseline Ebarb, Canary Brim, MD 11/11/22 2232

## 2022-11-11 NOTE — ED Triage Notes (Signed)
Pt complaining of abd pain, and bilateral flank pain. Pt endorses nausea. Pt believes it could be an issue with her pancreas, was seen recently for similar complaints and was told it was an issue with her pancreas.

## 2022-11-11 NOTE — Discharge Instructions (Signed)
Your history, exam, workup today are likely suggestive of an alcohol related gastritis given the symptoms worsening after drinking alcohol.  Your workup otherwise was reassuring and there is no evidence of acute diverticulitis, pancreatitis, or other surgical problem at this time.  We feel you are safe for discharge home given your improvement and please use the nausea medicine and stomach acid medicines to help with symptoms.  Please avoid alcohol greasy and spicy foods.  Please rest and stay hydrated and follow-up with your primary doctor and the GI doctor.  If any symptoms change or worsen acutely, please return to the nearest emergency department.

## 2022-12-11 ENCOUNTER — Inpatient Hospital Stay: Payer: No Typology Code available for payment source | Admitting: Nurse Practitioner

## 2022-12-27 ENCOUNTER — Encounter: Payer: Self-pay | Admitting: Physician Assistant

## 2022-12-27 ENCOUNTER — Ambulatory Visit: Payer: No Typology Code available for payment source | Attending: Physician Assistant | Admitting: Physician Assistant

## 2022-12-27 VITALS — BP 140/78 | HR 82 | Wt 158.6 lb

## 2022-12-27 DIAGNOSIS — I10 Essential (primary) hypertension: Secondary | ICD-10-CM | POA: Diagnosis not present

## 2022-12-27 DIAGNOSIS — Z139 Encounter for screening, unspecified: Secondary | ICD-10-CM

## 2022-12-27 DIAGNOSIS — E876 Hypokalemia: Secondary | ICD-10-CM | POA: Diagnosis not present

## 2022-12-27 DIAGNOSIS — R1013 Epigastric pain: Secondary | ICD-10-CM | POA: Diagnosis not present

## 2022-12-27 DIAGNOSIS — Z09 Encounter for follow-up examination after completed treatment for conditions other than malignant neoplasm: Secondary | ICD-10-CM

## 2022-12-27 MED ORDER — OMEPRAZOLE 20 MG PO CPDR
20.0000 mg | DELAYED_RELEASE_CAPSULE | Freq: Every day | ORAL | 0 refills | Status: DC
Start: 2022-12-27 — End: 2023-04-19

## 2022-12-27 NOTE — Progress Notes (Signed)
Patient ID: Evelyn Crawford, female   DOB: November 23, 1960, 63 y.o.   MRN: [id number]     Evelyn Crawford, is a 62 y.o. female  VHQ:[id number]  UXL:[id number]  DOB - [birth year]  Chief Complaint  Patient presents with   Hospitalization Follow-up   Medication Refill       Subjective:   Evelyn Crawford is a 62 y.o. female here today for a follow up visit After ED visit 11/11/2022 for LUQ and epigastric pain.  Still having episodes but better on omeprazole.  No N/V/D/hematemesis/melena/hematochezia.  Last colonoscopy reported in 2011 as normal.    She was on hydrochlorothiazide for htn but was taken off of it about 6 months ago bc Bps were normal/low on it and she had hypokalemia.  She is still taking potassium tablets for now.  She has not been checking blood pressures out of office but she does have a cuff.  Denies muscle cramping.    Drinks alcohol on the weekends but denies abuse   From ED visit.   Evelyn Crawford is a 62 y.o. female with a past medical history significant for fibroids with previous hysterectomy/bilateral salpingo-oophorectomy, hypertension, high triglycerides, alcohol abuse, and previous pancreatitis who presents with nausea and upper abdominal discomfort.  According to patient, she had alcohol yesterday and had "a lot of liquor" to drink.  She thinks this set off a pancreatitis episode and says she is now quitting drinking alcohol for good.  She reports no trauma.  She denies any constipation diarrhea or urinary changes.  Denies fevers, chills, congestion, or cough.  No chest pain or shortness of breath.  No back or flank pain.  Pain is primarily in her epigastric and left upper quadrant similar to when she had pancreatitis in the past.  She has had nausea but has not gotten anything up.   On exam, lungs clear.  Chest nontender.  Abdomen is tender in the left upper quadrant and epigastric area.  Good pulses in extremities.  Patient resting but has dry mucous membranes.    Clinically I do suspect recurrent pancreatitis likely due to alcohol abuse last night.  Patient also could have a alcohol related gastritis.  Will get screening labs but due to her history of abnormal CT scan related to pancreatitis, will get a CT scan.  Will give her fluids and some medications.  Will check labs.   Anticipate reassessment after workup to determine disposition.   Patient's workup returned overall reassuring.  CT scan did not show concerning findings at this time.  Urinalysis showed no nitrites and she had no urinary symptoms so doubt UTI.  Metabolic panel reassuring.  No lactic acidosis and her lipase was normal.  CBC reassuring. No problems updated.  ALLERGIES: Allergies  Allergen Reactions   Ibuprofen Nausea Only    PAST MEDICAL HISTORY: Past Medical History:  Diagnosis Date   Alcohol abuse    Hypertension    Iron deficiency anemia    Thrombocytopenia (HCC)    Tobacco abuse    Uterine fibroid     MEDICATIONS AT HOME: Prior to Admission medications   Medication Sig Start Date End Date Taking? Authorizing Provider  atorvastatin (LIPITOR) 20 MG tablet Take 1 tablet (20 mg total) by mouth daily. 06/06/22  Yes Claiborne Rigg, NP  ibuprofen (ADVIL) 800 MG tablet Take 1 tablet (800 mg total) by mouth every 8 (eight) hours as needed for moderate pain. For AFTER surgery only 09/06/22  Yes Claiborne Rigg, NP  Multiple  Vitamin (MULTIVITAMIN WITH MINERALS) TABS tablet Take 2 tablets by mouth daily.   Yes [provider]  ondansetron (ZOFRAN-ODT) 4 MG disintegrating tablet Take 1 tablet (4 mg total) by mouth every 8 (eight) hours as needed for nausea or vomiting. 11/11/22  Yes Tegeler, Canary Brim, MD  potassium chloride SA (KLOR-CON M) 20 MEQ tablet Take 1 tablet (20 mEq total) by mouth daily. 09/10/22  Yes Claiborne Rigg, NP  senna-docusate (SENOKOT-S) 8.6-50 MG tablet Take 2 tablets by mouth at bedtime. For AFTER surgery, do not take if having diarrhea 08/21/22   Yes Cross, Melissa D, NP  omeprazole (PRILOSEC) 20 MG capsule Take 1 capsule (20 mg total) by mouth daily. 12/27/22   Tamsyn Owusu, Marzella Schlein, PA-C    ROS: Neg HEENT Neg resp Neg cardiac Neg GU Neg MS Neg psych Neg neuro  Objective:   Vitals:   12/27/22 1455 12/27/22 1521  BP: (!) 167/94 (!) 140/78  Pulse: 82   SpO2: 95%   Weight: 158 lb 9.6 oz (71.9 kg)    Exam General appearance : Awake, alert, not in any distress. Speech Clear. Not toxic looking HEENT: Atraumatic and Normocephalic Neck: Supple, no JVD. No cervical lymphadenopathy.  Chest: Good air entry bilaterally, CTAB.  No rales/rhonchi/wheezing CVS: S1 S2 regular, no murmurs.  Abdomen: Bowel sounds present, Non tender and not distended with no gaurding, rigidity or rebound. Extremities: B/L Lower Ext shows no edema, both legs are warm to touch Neurology: Awake alert, and oriented X 3, CN II-XII intact, Non focal Skin: No Rash  Data Review Lab Results  Component Value Date   HGBA1C 5.5 06/17/2020    Assessment & Plan   1. Epigastric pain Improved but overdue colonoscopy - Ambulatory referral to Gastroenterology - omeprazole (PRILOSEC) 20 MG capsule; Take 1 capsule (20 mg total) by mouth daily.  Dispense: 90 capsule; Refill: 0  2. Hypokalemia Still on potassium Continue taking potassium pills for now, but Stop potassium 2 weeks before your next appointment. BMP at follow up. If potassium is normal at those labs after 2 weeks without potassium, she may stop potassium bc it was probably related to HCTZ   3. Hypertension, unspecified type Sit still and quiet for 5 mins before checking your blood pressure.  Check blood pressures daily and record.  BMP at follow up Consider amlodipine and not restarting HCTZ  4. Encounter for examination following treatment at hospital    Return in about 6 weeks (around 02/07/2023) for [facility] for blood pressure.  The patient was given clear instructions to go to ER or return to  medical center if symptoms don't improve, worsen or new problems develop. The patient verbalized understanding. The patient was told to call to get lab results if they haven't heard anything in the next week.      Georgian Co, PA-C [facility] [place], [place] [phone]   12/27/2022, 3:33 PM

## 2022-12-27 NOTE — Patient Instructions (Signed)
Continue taking potassium pills for now.     Stop potassium 2 weeks before your next appointment.  Sit still and quiet for 5 mins before checking your blood pressure.  Check blood pressures daily and record.

## 2023-01-01 NOTE — Addendum Note (Signed)
Addended by: Dalene Carrow I on: 01/01/2023 05:28 PM   Modules accepted: Orders

## 2023-01-08 ENCOUNTER — Other Ambulatory Visit: Payer: Self-pay | Admitting: Licensed Clinical Social Worker

## 2023-01-08 NOTE — Patient Outreach (Signed)
Care Coordination  01/08/2023  Evelyn Crawford October 13, 1960 [id number]  Patient apologized for missing scheduled initial [facility] LCSW appointment call from this morning but was able to successfully rescheduled this session for 01/19/23 at 1pm. Patient denied any current crises or urgent needs.   [name], BSW, MSW, LCSW Licensed Clinical Social Worker [employer]   [email or web address] Direct Dial: [phone]

## 2023-01-08 NOTE — Patient Outreach (Signed)
  Medicaid Managed Care   Unsuccessful Attempt Note   01/08/2023 Name: Evelyn Crawford MRN: [id number] DOB: [birth year]  Referred by: Claiborne Rigg, NP Reason for referral : No chief complaint on file.   An unsuccessful telephone outreach was attempted today. The patient was referred to the case management team for assistance with care management and care coordination.    Follow Up Plan: A HIPAA compliant phone message was left for the patient providing contact information and requesting a return call. and The Managed Medicaid care management team will reach out to the patient again over the next 30 days.   [name], BSW, MSW, LCSW Licensed Clinical Social Worker [employer]   [facility] [email or web address] Direct Dial: [phone]

## 2023-01-19 ENCOUNTER — Other Ambulatory Visit: Payer: Self-pay | Admitting: Licensed Clinical Social Worker

## 2023-01-19 NOTE — Patient Outreach (Addendum)
  Medicaid Managed Care Social Work Note  01/19/2023 Name:  Evelyn Crawford MRN:  [id number] DOB:  27-May-1960  Evelyn Crawford is an 62 y.o. year old female who is a primary patient of Evelyn Rigg, NP.  The Medicaid Managed Care Coordination team was consulted for assistance with:  Mental Health Counseling and Resources  Evelyn Crawford was given information about Medicaid Managed Care Coordination team services today. Evelyn Crawford Patient did not agree to services.  Patient provided Medicaid Managed Care team contact information and advised to reach out if in need of care coordination or care management services.  Primary care provider advised of patient declination of services.  Engaged with patient  for by telephone forinitial visit in response to referral for case management and/or care coordination services.  Patient reports being very appreciative of phone call but reports not being interested in gaining any resource education or connection to behavioral health or substance abuse services. She will keep this [facility] LCSW's direct contact information in the case that she changes her mind. [facility] LCSW will sign off at this time.  Assessments/Interventions:  Review of past medical history, allergies, medications, health status, including review of consultants reports, laboratory and other test data, was performed as part of comprehensive evaluation and provision of chronic care management services.  SDOH: (Social Drivers of Health) assessments and interventions performed: SDOH Interventions    Flowsheet Row Office Visit from 12/27/2022 in [facility] Dept Of [place]. [name] Office Visit from 06/06/2022 in [facility] - A Dept Of Eligha [facility]. [facility]  SDOH Interventions    Food Insecurity Interventions AMB Referral --  Housing Interventions Intervention Not Indicated --  Transportation Interventions Patient Declined --   Utilities Interventions Intervention Not Indicated --  Depression Interventions/Treatment  -- Patient refuses Treatment  Physical Activity Interventions Other (Comments) --  Stress Interventions Intervention Not Indicated --  Social Connections Interventions Intervention Not Indicated --  Health Literacy Interventions Intervention Not Indicated --      Care Plan                 Allergies  Allergen Reactions   Ibuprofen Nausea Only    Medications Reviewed Today   Medications were not reviewed in this encounter     Patient Active Problem List   Diagnosis Date Noted   Essential hypertension 12/09/2020   High triglycerides 12/09/2020   Bleeding skin mole 12/09/2020   Lumbar radiculopathy 08/10/2020   Thrombocytopenia (HCC) 11/22/2009   FIBROIDS, UTERUS 11/15/2009   ANEMIA, IRON DEFICIENCY 11/15/2009   ALCOHOL ABUSE 11/15/2009   TOBACCO ABUSE 11/15/2009    Conditions to be addressed/monitored per PCP order:   Alcohol Use  There are no care plans that you recently modified to display for this patient.   Follow up:  Patient requests no follow-up at this time.  Plan: The Managed Medicaid care management team is available to follow up with the patient after provider conversation with the patient regarding recommendation for care management engagement and subsequent re-referral to the care management team.   Evelyn Crawford, BSW, MSW, LCSW Licensed Clinical Social Worker Evelyn   [facility] [email or web address] Direct Dial: [phone]

## 2023-01-19 NOTE — Patient Instructions (Signed)
Visit Information  Evelyn Crawford was given information about Medicaid Managed Care team care coordination services as a part of their Healthy [facility] Medicaid benefit. [name] did not consent to engagement with the [facility] Managed Care team.   If you are experiencing a medical emergency, please call 911 or report to your local emergency department or urgent care.   If you have a non-emergency medical problem during routine business hours, please contact your provider's office and ask to speak with a nurse.   For questions related to your Healthy [facility] health plan, please call: [phone] or visit the homepage here: [email or web address]  If you would like to schedule transportation through your Healthy [facility] plan, please call the following number at least 2 days in advance of your appointment: [phone]  For information about your ride after you set it up, call Ride Assist at [phone]. Use this number to activate a Will Call pickup, or if your transportation is late for a scheduled pickup. Use this number, too, if you need to make a change or cancel a previously scheduled reservation.  If you need transportation services right away, call [phone]. The after-hours call center is staffed 24 hours to handle ride assistance and urgent reservation requests (including discharges) 365 days a year. Urgent trips include sick visits, hospital discharge requests and life-sustaining treatment.  Call the [facility] Line at [phone], at any time, 24 hours a day, 7 days a week. If you are in danger or need immediate medical attention call 911.  If you would like help to quit smoking, call 1-800-QUIT-NOW (281-341-1584) OR Espaol: 1-855-Djelo-Ya (8-756-433-2951) o para ms informacin haga clic aqu or Text READY to 884-166 to register via text  [name], BSW, MSW, LCSW Licensed Clinical Social Worker [employer]    [name] Direct Dial: [phone]

## 2023-01-28 ENCOUNTER — Encounter (HOSPITAL_COMMUNITY): Payer: Self-pay

## 2023-01-28 ENCOUNTER — Emergency Department (HOSPITAL_COMMUNITY)
Admission: EM | Admit: 2023-01-28 | Discharge: 2023-01-28 | Disposition: A | Discharge status: Home / Self Care | Payer: No Typology Code available for payment source | Attending: Emergency Medicine | Admitting: Emergency Medicine

## 2023-01-28 DIAGNOSIS — I10 Essential (primary) hypertension: Secondary | ICD-10-CM | POA: Diagnosis not present

## 2023-01-28 DIAGNOSIS — R1013 Epigastric pain: Secondary | ICD-10-CM | POA: Diagnosis present

## 2023-01-28 DIAGNOSIS — K2920 Alcoholic gastritis without bleeding: Secondary | ICD-10-CM | POA: Insufficient documentation

## 2023-01-28 DIAGNOSIS — Z79899 Other long term (current) drug therapy: Secondary | ICD-10-CM | POA: Diagnosis not present

## 2023-01-28 LAB — LIPASE, BLOOD: Lipase: 23 U/L (ref 11–51)

## 2023-01-28 LAB — CBC
HCT: 38.1 % (ref 36.0–46.0)
Hemoglobin: 12.8 g/dL (ref 12.0–15.0)
MCH: 31.8 pg (ref 26.0–34.0)
MCHC: 33.6 g/dL (ref 30.0–36.0)
MCV: 94.8 fL (ref 80.0–100.0)
Platelets: 179 10*3/uL (ref 150–400)
RBC: 4.02 MIL/uL (ref 3.87–5.11)
RDW: 12.5 % (ref 11.5–15.5)
WBC: 7.9 10*3/uL (ref 4.0–10.5)
nRBC: 0 % (ref 0.0–0.2)

## 2023-01-28 LAB — URINALYSIS, ROUTINE W REFLEX MICROSCOPIC
Bilirubin Urine: NEGATIVE
Glucose, UA: NEGATIVE mg/dL
Hgb urine dipstick: NEGATIVE
Ketones, ur: 5 mg/dL — AB
Leukocytes,Ua: NEGATIVE
Nitrite: NEGATIVE
Protein, ur: NEGATIVE mg/dL
Specific Gravity, Urine: 1.021 (ref 1.005–1.030)
pH: 5 (ref 5.0–8.0)

## 2023-01-28 LAB — COMPREHENSIVE METABOLIC PANEL
ALT: 18 U/L (ref 0–44)
AST: 23 U/L (ref 15–41)
Albumin: 4.2 g/dL (ref 3.5–5.0)
Alkaline Phosphatase: 84 U/L (ref 38–126)
Anion gap: 9 (ref 5–15)
BUN: 14 mg/dL (ref 8–23)
CO2: 24 mmol/L (ref 22–32)
Calcium: 9.9 mg/dL (ref 8.9–10.3)
Chloride: 109 mmol/L (ref 98–111)
Creatinine, Ser: 0.71 mg/dL (ref 0.44–1.00)
GFR, Estimated: >60 mL/min (ref >60)
Glucose, Bld: 100 mg/dL — ABNORMAL HIGH (ref 70–99)
Potassium: 3.8 mmol/L (ref 3.5–5.1)
Sodium: 142 mmol/L (ref 135–145)
Total Bilirubin: 0.5 mg/dL (ref <1.2)
Total Protein: 7.6 g/dL (ref 6.5–8.1)

## 2023-01-28 MED ORDER — ONDANSETRON HCL 4 MG/2ML IJ SOLN
4.0000 mg | Freq: Once | INTRAMUSCULAR | Status: AC
  Administered 2023-01-28: 4 mg via INTRAVENOUS
  Filled 2023-01-28: qty 2

## 2023-01-28 MED ORDER — PANTOPRAZOLE SODIUM 40 MG IV SOLR
40.0000 mg | Freq: Once | INTRAVENOUS | Status: AC
  Administered 2023-01-28: 40 mg via INTRAVENOUS
  Filled 2023-01-28: qty 10

## 2023-01-28 MED ORDER — HYDROMORPHONE HCL 1 MG/ML IJ SOLN
1.0000 mg | Freq: Once | INTRAMUSCULAR | Status: AC
Start: 2023-01-28 — End: 2023-01-28
  Administered 2023-01-28: 1 mg via INTRAVENOUS
  Filled 2023-01-28: qty 1

## 2023-01-28 MED ORDER — HYDROCODONE-ACETAMINOPHEN 5-325 MG PO TABS: 1.0000 | ORAL_TABLET | Freq: Four times a day (QID) | ORAL | 0 refills | Status: DC | PRN

## 2023-01-28 MED ORDER — SODIUM CHLORIDE 0.9 % IV BOLUS
1000.0000 mL | Freq: Once | INTRAVENOUS | Status: AC
  Administered 2023-01-28: 1000 mL via INTRAVENOUS

## 2023-01-28 MED ORDER — ONDANSETRON 4 MG PO TBDP: 4.0000 mg | ORAL_TABLET | Freq: Three times a day (TID) | ORAL | 0 refills | Status: DC | PRN

## 2023-01-28 NOTE — ED Triage Notes (Signed)
Pt arrives via POV. Pt reports abdominal pain and nausea. Pt reports hx of pancreatitis.

## 2023-01-28 NOTE — ED Provider Notes (Signed)
[place] EMERGENCY DEPARTMENT AT [facility] Provider Note   CSN: [id number] Arrival date & time: 01/28/23  1236     History  No chief complaint on file.   Evelyn Crawford is a 62 y.o. female.  Patient with history of HLD, HTN, alcohol abuse, alcoholic pancreatitis presents with pain in the epigastric and LUQ abdomen that started today. The pain is sharp, constant, without modifying factors. She admits to drinking alcohol for the last week or so, nightly. No vomiting but reports nausea. No fever. No constipation or diarrhea. No urinary symptoms.   The history is provided by the patient. No language interpreter was used.       Home Medications Prior to Admission medications   Medication Sig Start Date End Date Taking? Authorizing Provider  HYDROcodone-acetaminophen (NORCO) 5-325 MG tablet Take 1 tablet by mouth every 6 (six) hours as needed for moderate pain (pain score 4-6). 01/28/23  Yes Ayden Apodaca, Melvenia Beam, PA-C  atorvastatin (LIPITOR) 20 MG tablet Take 1 tablet (20 mg total) by mouth daily. 06/06/22   Claiborne Rigg, NP  ibuprofen (ADVIL) 800 MG tablet Take 1 tablet (800 mg total) by mouth every 8 (eight) hours as needed for moderate pain. For AFTER surgery only 09/06/22   Claiborne Rigg, NP  Multiple Vitamin (MULTIVITAMIN WITH MINERALS) TABS tablet Take 2 tablets by mouth daily.    [provider]  omeprazole (PRILOSEC) 20 MG capsule Take 1 capsule (20 mg total) by mouth daily. 12/27/22   Anders Simmonds, PA-C  ondansetron (ZOFRAN-ODT) 4 MG disintegrating tablet Take 1 tablet (4 mg total) by mouth every 8 (eight) hours as needed for nausea or vomiting. 01/28/23   Elpidio Anis, PA-C  potassium chloride SA (KLOR-CON M) 20 MEQ tablet Take 1 tablet (20 mEq total) by mouth daily. 09/10/22   Claiborne Rigg, NP  senna-docusate (SENOKOT-S) 8.6-50 MG tablet Take 2 tablets by mouth at bedtime. For AFTER surgery, do not take if having diarrhea 08/21/22   Warner Mccreedy  D, NP      Allergies    Ibuprofen    Review of Systems   Review of Systems  Physical Exam Updated Vital Signs BP (!) 149/73   Pulse (!) 56   Temp 98.1 F (36.7 C)   Resp 15   Ht 5\' 6"  (1.676 m)   Wt 70.8 kg   LMP 09/17/2010   SpO2 96%   BMI 25.18 kg/m  Physical Exam Vitals and nursing note reviewed.  Constitutional:      Comments: Uncomfortable appearing.   Cardiovascular:     Rate and Rhythm: Normal rate and regular rhythm.  Pulmonary:     Effort: Pulmonary effort is normal.  Abdominal:     General: There is no distension.     Palpations: Abdomen is soft.     Tenderness: There is abdominal tenderness (Epigastric and LUQ abdomen). There is no guarding.  Skin:    General: Skin is warm and dry.  Neurological:     Mental Status: She is oriented to person, place, and time.     ED Results / Procedures / Treatments   Labs (all labs ordered are listed, but only abnormal results are displayed) Labs Reviewed  COMPREHENSIVE METABOLIC PANEL - Abnormal; Notable for the following components:      Result Value   Glucose, Bld 100 (*)    All other components within normal limits  URINALYSIS, ROUTINE W REFLEX MICROSCOPIC - Abnormal; Notable for the following components:  Ketones, ur 5 (*)    All other components within normal limits  LIPASE, BLOOD  CBC    EKG None  Radiology No results found.  Procedures Procedures    Medications Ordered in ED Medications  sodium chloride 0.9 % bolus 1,000 mL (1,000 mLs Intravenous New Bag/Given 01/28/23 1538)  HYDROmorphone (DILAUDID) injection 1 mg (1 mg Intravenous Given 01/28/23 1538)  ondansetron (ZOFRAN) injection 4 mg (4 mg Intravenous Given 01/28/23 1538)  pantoprazole (PROTONIX) injection 40 mg (40 mg Intravenous Given 01/28/23 1538)    ED Course/ Medical Decision Making/ A&P                                 Medical Decision Making This patient presents to the ED for concern of abdominal pain, this involves an  extensive number of treatment options, and is a complaint that carries with it a high risk of complications and morbidity.  The differential diagnosis includes colitis, obstruction, viral/bacterial infection, recurrent pancreatitis, gastritis, ulcer, perforation   Co morbidities that complicate the patient evaluation  Alcohol abuse with previous pancreatitis   Additional history obtained:  Additional history and/or information obtained from chart review, notable for EMS GI office follow up of pancreatic cyst: "MRI/MRCP was performed August 08, 2022.  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    Lab Tests:  I Ordered, and personally interpreted labs.  The pertinent results include:  Normal lipase, no WBC count. She has ketones (5) in urine.    Imaging Studies ordered:  I ordered imaging studies including No imaging is felt indicated today I independently visualized and interpreted imaging which showed n/a I agree with the radiologist interpretation    Medicines ordered and prescription drug management:  I ordered medication including dilaudid and zofrn  for pain and nausea Reevaluation of the patient after these medicines showed that the patient resolved I have reviewed the patients home medicines and have made adjustments as needed   Test Considered:  Repeat CT scan - felt not indicated today. Abnormal CT scan findings from May of this year have been addressed in the outpatient setting (GI, and GYN follow up). Normal lipase, VSS.    Critical Interventions:  Pain and nausea relief.  PO challenge   Consultations Obtained:  I requested consultation with the ED attending, Pickering,  and discussed lab and imaging findings as well as pertinent plan - they recommend: agree with no imaging today. Treat  symptomatically.    Problem List / ED Course:  Recurrent abdominal pain after alcohol use over the last week No evidence acute pancreatitis today - consider gastritis likely Will treat symptomatically Has received IV fluids, pain and nausea medication - feels much better    Reevaluation:  After the interventions noted above, I reevaluated the patient and found that they have :resolved   Social Determinants of Health:  Alcohol abuse, continuous   Disposition:  After consideration of the diagnostic results and the patients response to treatment, I feel that the patient would benefit from discharge home.   Amount and/or Complexity of Data Reviewed Labs: ordered.  Risk Prescription drug management.           Final Clinical Impression(s) / ED Diagnoses Final diagnoses:  Acute alcoholic gastritis without hemorrhage    Rx / DC  Orders ED Discharge Orders          Ordered    ondansetron (ZOFRAN-ODT) 4 MG disintegrating tablet  Every 8 hours PRN        01/28/23 1632    HYDROcodone-acetaminophen (NORCO) 5-325 MG tablet  Every 6 hours PRN        01/28/23 1632              Elpidio Anis, PA-C 01/28/23 1634    Benjiman Core, MD 01/28/23 2322

## 2023-01-28 NOTE — Discharge Instructions (Signed)
Continue taking Prilosec (prescribed 12/27/22). You can take this twice daily for the next week then return to once daily dosing after that.   You can take over-the-counter preparations for gastritis as well like Maalox or Mylanta. Try to abstain from alcohol use for now.

## 2023-02-08 ENCOUNTER — Ambulatory Visit: Payer: No Typology Code available for payment source | Attending: Nurse Practitioner | Admitting: Pharmacist

## 2023-02-08 ENCOUNTER — Other Ambulatory Visit: Payer: Self-pay

## 2023-02-08 ENCOUNTER — Encounter: Payer: Self-pay | Admitting: Pharmacist

## 2023-02-08 VITALS — BP 155/87 | HR 58

## 2023-02-08 DIAGNOSIS — I10 Essential (primary) hypertension: Secondary | ICD-10-CM

## 2023-02-08 MED ORDER — BLOOD PRESSURE CUFF MISC
0 refills | Status: DC
  Filled 2023-02-08: qty 1, fill #0

## 2023-02-08 MED ORDER — VALSARTAN 80 MG PO TABS: 80.0000 mg | ORAL_TABLET | Freq: Every day | ORAL | 0 refills | Status: DC

## 2023-02-08 NOTE — Progress Notes (Signed)
 S:     No chief complaint on file.  64 y.o. female with PMHx of HTN, tobacco use, alcohol use disorder, uterine fibroids, and iron deficiency anemia who presents for hypertension evaluation, education, and management.   Patient was last seen by Primary Care Provider, Jon Moores, PA-C, on 12/27/2022 for ED follow-up visit. She was seen in the ED on 11/11/2022 for LUQ/epigastric pain. She reported to [facility] that her pain had improved with Omeprazole . Moores also noted that she had been taken off hydrochlorothiazide  for hypertension ~6 months ago due to low to normal blood pressure readings on it. During that visit, her BP was 167/94, then 140/78 ~30 minutes later. She was advised to stop potassium for hypokalemia in two weeks pending normal BMP and was referred to pharmacy for a 6-week BP check.   Of note, she was recently seen in the ED again on 01/28/2023 for epigastric/LUQ pain related to alcoholic pancreatitis. At that time she admitted to drinking nightly for a week. She was treated symptomatically and discharged later that same night.   Today, patient arrives in good spirits and presents without assistance. Denies dizziness, headache, blurred vision, swelling. She states she has stopped taking potassium. She is no longer measuring her BP at home because her home monitor is not working.  She reports continued epigastric/LUQ pain. Norco and Omperazole ahs helped with this. She also admitted to pain in her left leg due to a pinched nerve. This pain prevents her from sleeping. Muscle relaxers have not helped with this in the past. She was interested in an ibuprofen  refill today, however due to hypertensive office readings we are unable to fulfill this request.    Family/Social history:  Family History -Mother: MI, fibroids  -Father: Brain cancer   Social History Alcohol Use: 1 pint of liquor per day Tobacco Use: Smoker; 5 cigarettes/day   Current antihypertensives include:   -None  Antihypertensives tried in the past include:  -hydrochlorothiazide  25 mg every day   Patient reported dietary habits: Eats 2 meals/day Avoiding greasy foods and avoiding salt because she is scared  ASCVD risk factors include:  -Smoker   O:  Vitals:   02/08/23 1614 02/08/23 1625  BP: (!) 158/91 (!) 155/87  Pulse: 62 (!) 58    Last 3 Office BP readings: BP Readings from Last 3 Encounters:  01/28/23 (!) 149/73  12/27/22 (!) 140/78  11/11/22 (!) 167/88    BMET    Component Value Date/Time   NA 142 01/28/2023 1356   NA 142 12/06/2021 1638   K 3.8 01/28/2023 1356   CL 109 01/28/2023 1356   CO2 24 01/28/2023 1356   GLUCOSE 100 (H) 01/28/2023 1356   BUN 14 01/28/2023 1356   BUN 12 12/06/2021 1638   CREATININE 0.71 01/28/2023 1356   CREATININE 0.68 11/02/2010 1346   CALCIUM  9.9 01/28/2023 1356   GFRNONAA >60 01/28/2023 1356   GFRAA 109 01/16/2019 1101    Renal function: Estimated Creatinine Clearance: 69.1 mL/min (by C-G formula based on SCr of 0.71 mg/dL).  Clinical ASCVD: No  The 10-year ASCVD risk score (Arnett DK, et al., 2019) is: 16.8%   Values used to calculate the score:     Age: 81 years     Sex: Female     Is Non-Hispanic African American: Yes     Diabetic: No     Tobacco smoker: Yes     Systolic Blood Pressure: 149 mmHg     Is BP treated: No  HDL Cholesterol: 43 mg/dL     Total Cholesterol: 181 mg/dL  Patient is participating in a Managed Medicaid Plan:  Yes    A/P: Hypertension diagnosed currently uncontrolled currently. BP goal < 130/80 mmHg. Will avoid amlodipine at this time for risk of LE edema and the ongoing LE pain she already has. Will avoid thiazides given alcohol use and potential for electrolyte abnormalities.  -Started Valsartan  80 mg once daily.  -Prescription for new home BP monitor sent to [facility] -Patient educated on purpose, proper use, and potential adverse effects of Valsartan  (angioedema, hyperkalemia).   -She will return for f/u BMP in 1 month -Counseled on lifestyle modifications for blood pressure control including reduced dietary sodium, increased exercise, adequate sleep. -Encouraged patient to check BP at home and bring log of readings to next visit. Counseled on proper use of home BP cuff.  -She was instructed to follow up with Dr. Abran with GI. She was also encouraged to stop drinking to help with her abdominal pain. I advised that Dr. Nancyann office likely has resources to help her with this issue. She was agreeable to this plan.  Follow-up: In 1 month with Haze Servant, NP for new start Valsartan /BP check and BMP  Tolu Teresha Hanks, PharmD [facility] PGY-1

## 2023-03-06 ENCOUNTER — Other Ambulatory Visit: Payer: Self-pay | Admitting: Nurse Practitioner

## 2023-03-06 DIAGNOSIS — E781 Pure hyperglyceridemia: Secondary | ICD-10-CM

## 2023-03-06 NOTE — Telephone Encounter (Signed)
Requested medication (s) are due for refill today: yes  Requested medication (s) are on the active medication list: yes  Last refill:  06/06/22 #90 1 refills  Future visit scheduled: no   Notes to clinic:   protocol failed last labs 12/06/21. Do you want to continue refills?     Requested Prescriptions  Pending Prescriptions Disp Refills   atorvastatin (LIPITOR) 20 MG tablet [Pharmacy Med Name: ATORVASTATIN 20 MG TABLET] 90 tablet 1    Sig: TAKE 1 TABLET BY MOUTH EVERY DAY     Cardiovascular:  Antilipid - Statins Failed - 03/06/2023  1:53 PM      Failed - Lipid Panel in normal range within the last 12 months    Cholesterol, Total  Date Value Ref Range Status  12/06/2021 181 100 - 199 mg/dL Final   LDL Chol Calc (NIH)  Date Value Ref Range Status  12/06/2021 78 0 - 99 mg/dL Final   HDL  Date Value Ref Range Status  12/06/2021 43 >39 mg/dL Final   Triglycerides  Date Value Ref Range Status  12/06/2021 371 (H) 0 - 149 mg/dL Final         Passed - Patient is not pregnant      Passed - Valid encounter within last 12 months    Recent Outpatient Visits           3 weeks ago Essential hypertension   [place] [facility] [place] [facility] [place]. [facility] Drucilla Chalet, RPH-CPP   2 months ago Epigastric pain   [place] [facility] [place] [facility] Dept Of [place]. [facility], Marylene Land M, [place]   6 months ago Primary hypertension   [place] [facility] [place] - A Dept Of [place]. [facility] Claiborne Rigg, NP   9 months ago Neuropathy   [place] [facility] [place] - A Dept Of [place]. [facility] Claiborne Rigg, NP   1 year ago Encounter to establish care    [facility] [place] - A Dept Of [place]. [facility] Claiborne Rigg, [place]

## 2023-03-08 ENCOUNTER — Other Ambulatory Visit: Payer: Self-pay | Admitting: Nurse Practitioner

## 2023-03-08 DIAGNOSIS — E876 Hypokalemia: Secondary | ICD-10-CM

## 2023-03-08 NOTE — Telephone Encounter (Signed)
Requested by interface surescripts.  Requested Prescriptions  Pending Prescriptions Disp Refills   KLOR-CON M20 20 MEQ tablet [Pharmacy Med Name: KLOR-CON M20 TABLET] 90 tablet 1    Sig: TAKE 1 TABLET BY MOUTH EVERY DAY     Endocrinology:  Minerals - Potassium Supplementation Passed - 03/08/2023 11:58 AM      Passed - K in normal range and within 360 days    Potassium  Date Value Ref Range Status  01/28/2023 3.8 3.5 - 5.1 mmol/L Final         Passed - Cr in normal range and within 360 days    Creat  Date Value Ref Range Status  11/02/2010 0.68 0.50 - 1.10 mg/dL Final   Creatinine, Ser  Date Value Ref Range Status  01/28/2023 0.71 0.44 - 1.00 mg/dL Final         Passed - Valid encounter within last 12 months    Recent Outpatient Visits           4 weeks ago Essential hypertension   [place] [facility] [place] [facility] Dept Of [place]. [facility] Drucilla Chalet, RPH-CPP   2 months ago Epigastric pain   [place] [facility] [place] [facility] Dept Of [place]. [facility], Marylene Land M, [place]   6 months ago Primary hypertension   [place] [facility] [place] - A Dept Of [place]. [facility] Claiborne Rigg, NP   9 months ago Neuropathy   [place] [facility] [place] - A Dept Of [place]. [facility] Claiborne Rigg, NP   1 year ago Encounter to establish care   [place] [facility] [place] - A Dept Of [place]. [facility] Claiborne Rigg, [place]

## 2023-04-18 ENCOUNTER — Other Ambulatory Visit: Payer: Self-pay | Admitting: Physician Assistant

## 2023-04-18 ENCOUNTER — Other Ambulatory Visit: Payer: Self-pay | Admitting: Family Medicine

## 2023-04-18 DIAGNOSIS — I10 Essential (primary) hypertension: Secondary | ICD-10-CM

## 2023-04-18 DIAGNOSIS — R1013 Epigastric pain: Secondary | ICD-10-CM

## 2023-04-19 NOTE — Telephone Encounter (Signed)
 Patient will need an office visit for additional refills. Requested Prescriptions  Pending Prescriptions Disp Refills   valsartan (DIOVAN) 80 MG tablet [Pharmacy Med Name: VALSARTAN 80 MG TABLET] 90 tablet 0    Sig: TAKE 1 TABLET BY MOUTH EVERY DAY     Cardiovascular:  Angiotensin Receptor Blockers Failed - 04/19/2023 11:39 AM      Failed - Last BP in normal range    BP Readings from Last 1 Encounters:  02/08/23 (!) 155/87         Passed - Cr in normal range and within 180 days    Creat  Date Value Ref Range Status  11/02/2010 0.68 0.50 - 1.10 mg/dL Final   Creatinine, Ser  Date Value Ref Range Status  01/28/2023 0.71 0.44 - 1.00 mg/dL Final         Passed - K in normal range and within 180 days    Potassium  Date Value Ref Range Status  01/28/2023 3.8 3.5 - 5.1 mmol/L Final         Passed - Patient is not pregnant      Passed - Valid encounter within last 6 months    Recent Outpatient Visits           2 months ago Essential hypertension   [place] [facility] [place] [facility] Dept Of [place]. [facility] Drucilla Chalet, RPH-CPP   3 months ago Epigastric pain   [place] [facility] [place] - A Dept Of [place]. [facility], [place], [place]   7 months ago Primary hypertension   [place] [facility] [place] - A Dept Of [place]. [facility] Claiborne Rigg, NP   10 months ago Neuropathy   [place] [facility] [place] - A Dept Of [place]. [facility] Claiborne Rigg, NP   1 year ago Encounter to establish care   [place] [facility] [place] - A Dept Of . [facility] Claiborne Rigg, [place]

## 2023-04-19 NOTE — Telephone Encounter (Signed)
 Requested Prescriptions  Pending Prescriptions Disp Refills   omeprazole (PRILOSEC) 20 MG capsule [Pharmacy Med Name: OMEPRAZOLE DR 20 MG CAPSULE] 30 capsule 2    Sig: TAKE 1 CAPSULE BY MOUTH EVERY DAY     Gastroenterology: Proton Pump Inhibitors Passed - 04/19/2023 11:43 AM      Passed - Valid encounter within last 12 months    Recent Outpatient Visits           2 months ago Essential hypertension   [place] [facility] [place] [facility] Dept Of [place]. [facility] Drucilla Chalet, RPH-CPP   3 months ago Epigastric pain   [place] [facility] [place] [facility] Dept Of [place]. [facility], [place], [place]   7 months ago Primary hypertension   [place] [facility] [place] - A Dept Of [place]. [facility] Claiborne Rigg, NP   10 months ago Neuropathy   [place] [facility] [place] - A Dept Of [place]. [facility] Claiborne Rigg, NP   1 year ago Encounter to establish care   [place] [facility] [place] - A Dept Of . [facility] Claiborne Rigg, [place]

## 2023-06-14 ENCOUNTER — Other Ambulatory Visit: Payer: Self-pay | Admitting: Nurse Practitioner

## 2023-06-14 DIAGNOSIS — R1013 Epigastric pain: Secondary | ICD-10-CM

## 2023-06-26 ENCOUNTER — Other Ambulatory Visit: Payer: Self-pay | Admitting: Family Medicine

## 2023-06-26 ENCOUNTER — Other Ambulatory Visit: Payer: Self-pay | Admitting: Nurse Practitioner

## 2023-06-26 DIAGNOSIS — I10 Essential (primary) hypertension: Secondary | ICD-10-CM

## 2023-06-26 DIAGNOSIS — R1013 Epigastric pain: Secondary | ICD-10-CM

## 2023-08-22 ENCOUNTER — Other Ambulatory Visit: Payer: Self-pay

## 2023-08-22 ENCOUNTER — Emergency Department (HOSPITAL_COMMUNITY)

## 2023-08-22 ENCOUNTER — Encounter (HOSPITAL_COMMUNITY): Payer: Self-pay | Admitting: Emergency Medicine

## 2023-08-22 ENCOUNTER — Emergency Department (HOSPITAL_COMMUNITY)
Admission: EM | Admit: 2023-08-22 | Discharge: 2023-08-22 | Disposition: A | Discharge status: Home / Self Care | Attending: Emergency Medicine | Admitting: Emergency Medicine

## 2023-08-22 DIAGNOSIS — D3502 Benign neoplasm of left adrenal gland: Secondary | ICD-10-CM | POA: Diagnosis not present

## 2023-08-22 DIAGNOSIS — R109 Unspecified abdominal pain: Secondary | ICD-10-CM | POA: Diagnosis not present

## 2023-08-22 DIAGNOSIS — E876 Hypokalemia: Secondary | ICD-10-CM | POA: Diagnosis not present

## 2023-08-22 DIAGNOSIS — F17200 Nicotine dependence, unspecified, uncomplicated: Secondary | ICD-10-CM | POA: Insufficient documentation

## 2023-08-22 DIAGNOSIS — R1033 Periumbilical pain: Secondary | ICD-10-CM | POA: Diagnosis not present

## 2023-08-22 DIAGNOSIS — R1013 Epigastric pain: Secondary | ICD-10-CM | POA: Insufficient documentation

## 2023-08-22 DIAGNOSIS — I10 Essential (primary) hypertension: Secondary | ICD-10-CM | POA: Insufficient documentation

## 2023-08-22 DIAGNOSIS — K429 Umbilical hernia without obstruction or gangrene: Secondary | ICD-10-CM | POA: Diagnosis not present

## 2023-08-22 HISTORY — DX: Acute pancreatitis without necrosis or infection, unspecified: K85.90

## 2023-08-22 LAB — URINALYSIS, ROUTINE W REFLEX MICROSCOPIC
Bilirubin Urine: NEGATIVE
Glucose, UA: NEGATIVE mg/dL
Hgb urine dipstick: NEGATIVE
Ketones, ur: NEGATIVE mg/dL
Leukocytes,Ua: NEGATIVE
Nitrite: NEGATIVE
Protein, ur: NEGATIVE mg/dL
Specific Gravity, Urine: 1.024 (ref 1.005–1.030)
pH: 5 (ref 5.0–8.0)

## 2023-08-22 LAB — COMPREHENSIVE METABOLIC PANEL WITH GFR
ALT: 17 U/L (ref 0–44)
AST: 23 U/L (ref 15–41)
Albumin: 4 g/dL (ref 3.5–5.0)
Alkaline Phosphatase: 71 U/L (ref 38–126)
Anion gap: 10 (ref 5–15)
BUN: 12 mg/dL (ref 8–23)
CO2: 26 mmol/L (ref 22–32)
Calcium: 10.2 mg/dL (ref 8.9–10.3)
Chloride: 109 mmol/L (ref 98–111)
Creatinine, Ser: 0.59 mg/dL (ref 0.44–1.00)
GFR, Estimated: >60 mL/min (ref >60)
Glucose, Bld: 97 mg/dL (ref 70–99)
Potassium: 3 mmol/L — ABNORMAL LOW (ref 3.5–5.1)
Sodium: 145 mmol/L (ref 135–145)
Total Bilirubin: 0.6 mg/dL (ref 0.0–1.2)
Total Protein: 7.6 g/dL (ref 6.5–8.1)

## 2023-08-22 LAB — CBC
HCT: 39.5 % (ref 36.0–46.0)
Hemoglobin: 12.7 g/dL (ref 12.0–15.0)
MCH: 30.8 pg (ref 26.0–34.0)
MCHC: 32.2 g/dL (ref 30.0–36.0)
MCV: 95.9 fL (ref 80.0–100.0)
Platelets: 153 K/uL (ref 150–400)
RBC: 4.12 MIL/uL (ref 3.87–5.11)
RDW: 13 % (ref 11.5–15.5)
WBC: 6.4 K/uL (ref 4.0–10.5)
nRBC: 0 % (ref 0.0–0.2)

## 2023-08-22 LAB — MAGNESIUM: Magnesium: 1.8 mg/dL (ref 1.7–2.4)

## 2023-08-22 LAB — LIPASE, BLOOD: Lipase: 31 U/L (ref 11–51)

## 2023-08-22 MED ORDER — PANTOPRAZOLE SODIUM 20 MG PO TBEC: 20.0000 mg | DELAYED_RELEASE_TABLET | Freq: Every day | ORAL | 0 refills | Status: DC

## 2023-08-22 MED ORDER — ONDANSETRON HCL 4 MG/2ML IJ SOLN
4.0000 mg | Freq: Once | INTRAMUSCULAR | Status: AC
  Administered 2023-08-22: 4 mg via INTRAVENOUS
  Filled 2023-08-22: qty 2

## 2023-08-22 MED ORDER — MORPHINE SULFATE (PF) 4 MG/ML IV SOLN
4.0000 mg | Freq: Once | INTRAVENOUS | Status: AC
  Administered 2023-08-22: 4 mg via INTRAVENOUS
  Filled 2023-08-22: qty 1

## 2023-08-22 MED ORDER — ONDANSETRON 4 MG PO TBDP
4.0000 mg | ORAL_TABLET | Freq: Three times a day (TID) | ORAL | 0 refills | Status: DC | PRN
Start: 2023-08-22 — End: 2023-10-27

## 2023-08-22 MED ORDER — KETOROLAC TROMETHAMINE 15 MG/ML IJ SOLN
15.0000 mg | Freq: Once | INTRAMUSCULAR | Status: AC
  Administered 2023-08-22: 15 mg via INTRAVENOUS
  Filled 2023-08-22: qty 1

## 2023-08-22 MED ORDER — POTASSIUM CHLORIDE CRYS ER 20 MEQ PO TBCR
40.0000 meq | EXTENDED_RELEASE_TABLET | Freq: Once | ORAL | Status: AC
Start: 2023-08-22 — End: 2023-08-22
  Administered 2023-08-22: 40 meq via ORAL
  Filled 2023-08-22: qty 2

## 2023-08-22 MED ORDER — HYDROCODONE-ACETAMINOPHEN 5-325 MG PO TABS: 1.0000 | ORAL_TABLET | Freq: Four times a day (QID) | ORAL | 0 refills | Status: DC | PRN

## 2023-08-22 MED ORDER — IOHEXOL 300 MG/ML  SOLN
100.0000 mL | Freq: Once | INTRAMUSCULAR | Status: AC | PRN
  Administered 2023-08-22: 100 mL via INTRAVENOUS

## 2023-08-22 MED ORDER — ALUM & MAG HYDROXIDE-SIMETH 200-200-20 MG/5ML PO SUSP
30.0000 mL | Freq: Once | ORAL | Status: AC
  Administered 2023-08-22: 30 mL via ORAL
  Filled 2023-08-22: qty 30

## 2023-08-22 NOTE — ED Triage Notes (Signed)
 Patient comes in with abdominal pain, states hx of pancrititis. She was drinking yesterday and now stomach pain.

## 2023-08-22 NOTE — Discharge Instructions (Addendum)
 I would recommend Tylenol , ibuprofen  for pain control.  I have sent Norco to your pharmacy which you should take for breakthrough pain. Take Zofran  as needed for nausea or vomiting. Follow-up with your GI doctor and your primary care as discussed.  Return to ER with new or worsening symptoms.

## 2023-08-22 NOTE — ED Provider Notes (Signed)
 [place] EMERGENCY DEPARTMENT AT [facility] Provider Note   CSN: [id number] Arrival date & time: 08/22/23  1137     Patient presents with: Abdominal Pain   Evelyn Crawford is a 63 y.o. female.  With past medical history of alcohol abuse, chronic pancreatitis, tobacco abuse, hypertension presents to emergency room with complaint of abdominal pain.  Patient reports that this started this morning and it is in the epigastric area.  She reports this feels like her typical pancreatitis symptoms and that she was drinking yesterday.  She denies any nausea or vomiting or diarrhea.  She is tolerating oral intake.  Denies any fever.    Abdominal Pain      Prior to Admission medications   Medication Sig Start Date End Date Taking? Authorizing Provider  atorvastatin  (LIPITOR) 20 MG tablet TAKE 1 TABLET BY MOUTH EVERY DAY 03/06/23   Newlin, Enobong, MD  Blood Pressure Monitoring (BLOOD PRESSURE CUFF) MISC Use to check blood pressure once daily. 02/08/23   Newlin, Enobong, MD  HYDROcodone -acetaminophen  (NORCO) 5-325 MG tablet Take 1 tablet by mouth every 6 (six) hours as needed for moderate pain (pain score 4-6). 01/28/23   Odell Balls, PA-C  ibuprofen  (ADVIL ) 800 MG tablet Take 1 tablet (800 mg total) by mouth every 8 (eight) hours as needed for moderate pain. For AFTER surgery only 09/06/22   Theotis Haze ORN, NP  KLOR-CON  M20 20 MEQ tablet TAKE 1 TABLET BY MOUTH EVERY DAY 03/08/23   Fleming, Zelda W, NP  Multiple Vitamin (MULTIVITAMIN WITH MINERALS) TABS tablet Take 2 tablets by mouth daily.    [provider]  omeprazole  (PRILOSEC) 20 MG capsule Take 1 capsule (20 mg total) by mouth daily. Must have office visit for refills 06/14/23   Delbert Clam, MD  ondansetron  (ZOFRAN -ODT) 4 MG disintegrating tablet Take 1 tablet (4 mg total) by mouth every 8 (eight) hours as needed for nausea or vomiting. 01/28/23   Odell Balls, PA-C  senna-docusate (SENOKOT-S) 8.6-50 MG tablet Take 2  tablets by mouth at bedtime. For AFTER surgery, do not take if having diarrhea 08/21/22   Micheline Setter D, NP  valsartan  (DIOVAN ) 80 MG tablet TAKE 1 TABLET BY MOUTH EVERY DAY 04/19/23   Fleming, Zelda W, NP    Allergies: Ibuprofen     Review of Systems  Gastrointestinal:  Positive for abdominal pain.    Updated Vital Signs BP (!) 164/97   Pulse 85   Temp 98.4 F (36.9 C) (Oral)   Resp 16   Ht 5' 6 (1.676 m)   Wt 69.9 kg   LMP 09/17/2010   SpO2 95%   BMI 24.86 kg/m   Physical Exam Vitals and nursing note reviewed.  Constitutional:      General: She is not in acute distress.    Appearance: She is not toxic-appearing.  HENT:     Head: Normocephalic and atraumatic.  Eyes:     General: No scleral icterus.    Conjunctiva/sclera: Conjunctivae normal.  Cardiovascular:     Rate and Rhythm: Normal rate and regular rhythm.     Pulses: Normal pulses.     Heart sounds: Normal heart sounds.  Pulmonary:     Effort: Pulmonary effort is normal. No respiratory distress.     Breath sounds: Normal breath sounds.  Abdominal:     General: Abdomen is flat. Bowel sounds are normal.     Palpations: Abdomen is soft.     Tenderness: There is no abdominal tenderness.  Comments: Tenderness to palpation in epigastric and periumbilical region of abdomen  Musculoskeletal:     Right lower leg: No edema.     Left lower leg: No edema.  Skin:    General: Skin is warm and dry.     Findings: No lesion.  Neurological:     General: No focal deficit present.     Mental Status: She is alert and oriented to person, place, and time. Mental status is at baseline.     (all labs ordered are listed, but only abnormal results are displayed) Labs Reviewed  COMPREHENSIVE METABOLIC PANEL WITH GFR - Abnormal; Notable for the following components:      Result Value   Potassium 3.0 (*)    All other components within normal limits  LIPASE, BLOOD  CBC  URINALYSIS, ROUTINE W REFLEX MICROSCOPIC  MAGNESIUM     EKG: None  Radiology: CT ABDOMEN PELVIS W CONTRAST Result Date: 08/22/2023 CLINICAL DATA:  Abdominal pain, acute, nonlocalized. EXAM: CT ABDOMEN AND PELVIS WITH CONTRAST TECHNIQUE: Multidetector CT imaging of the abdomen and pelvis was performed using the standard protocol following bolus administration of intravenous contrast. RADIATION DOSE REDUCTION: This exam was performed according to the departmental dose-optimization program which includes automated exposure control, adjustment of the mA and/or kV according to patient size and/or use of iterative reconstruction technique. CONTRAST:  OMNIPAQUE  IOHEXOL  300 MG/ML  SOLN COMPARISON:  CT scan abdomen and pelvis from 11/11/2022. FINDINGS: Lower chest: The lung bases are clear. No pleural effusion. The heart is normal in size. No pericardial effusion. Hepatobiliary: The liver is normal in size. Non-cirrhotic configuration. No suspicious mass. These is mild diffuse hepatic steatosis. No intrahepatic or extrahepatic bile duct dilation. No calcified gallstones. Normal gallbladder wall thickness. No pericholecystic inflammatory changes. Pancreas: Previously described hypoattenuating focus in the proximal pancreatic body is not well evaluated on this exam but appears grossly unchanged (series 8, image 53). Pancreas is otherwise unremarkable. Spleen: Within normal limits. No focal lesion. Adrenals/Urinary Tract: There is a stable 2.4 x 2.6 cm left adrenal adenoma. Unremarkable right adrenal gland. No suspicious renal mass. There is excreted contrast in the bilateral renal collecting systems, which limits evaluation for renal calculi. No hydroureteronephrosis on either side. Urinary bladder is under distended, precluding optimal assessment. However, no large mass or stones identified. No perivesical fat stranding. Stomach/Bowel: No disproportionate dilation of the small or large bowel loops. No evidence of abnormal bowel wall thickening or inflammatory  changes. The appendix is unremarkable. Vascular/Lymphatic: No ascites or pneumoperitoneum. No abdominal or pelvic lymphadenopathy, by size criteria. No aneurysmal dilation of the major abdominal arteries. There are mild peripheral atherosclerotic vascular calcifications of the aorta and its major branches. Reproductive: The uterus is surgically absent. No large adnexal mass. Other: There is a tiny fat containing umbilical hernia. The soft tissues and abdominal wall are otherwise unremarkable. Musculoskeletal: No suspicious osseous lesions. There are mild multilevel degenerative changes in the visualized spine. IMPRESSION: 1. No acute inflammatory process identified within the abdomen or pelvis. 2. Multiple chronic observations, as described above. Aortic Atherosclerosis (ICD10-I70.0). Electronically Signed   By: Ree Molt M.D.   On: 08/22/2023 15:57     Procedures   Medications Ordered in the ED  alum & mag hydroxide-simeth (MAALOX/MYLANTA) 200-200-20 MG/5ML suspension 30 mL (30 mLs Oral Given 08/22/23 1530)  morphine  (PF) 4 MG/ML injection 4 mg (4 mg Intravenous Given 08/22/23 1530)  ondansetron  (ZOFRAN ) injection 4 mg (4 mg Intravenous Given 08/22/23 1529)  potassium chloride  SA (  KLOR-CON  M) CR tablet 40 mEq (40 mEq Oral Given 08/22/23 1530)  iohexol  (OMNIPAQUE ) 300 MG/ML solution 100 mL (100 mLs Intravenous Contrast Given 08/22/23 1537)                                    Medical Decision Making Amount and/or Complexity of Data Reviewed Labs: ordered. Radiology: ordered.  Risk OTC drugs. Prescription drug management.   This patient presents to the ED for concern of abdominal pain, this involves an extensive number of treatment options, and is a complaint that carries with it a high risk of complications and morbidity.  The differential diagnosis includes pancreatitis, appendicitis, cholecystitis, obstruction   Co morbidities that complicate the patient evaluation  Pancreatitis,  alcohol abuse   Additional history obtained:  Additional history obtained from ED visit 01/28/2023    Lab Tests:  I personally interpreted labs.  The pertinent results include:   Labs are significant for: Potassium of 3 which was supplemented orally.  No UTI.  No leukocytosis noted   Imaging Studies ordered:  I ordered imaging studies including CT scan I independently visualized and interpreted imaging which showed no acute findings I agree with the radiologist interpretation   Cardiac Monitoring: / EKG:  The patient was maintained on a cardiac monitor.    Problem List / ED Course / Critical interventions / Medication management  Patient reports to emergency room with complaint of abdominal pain.  Started this morning.  She is tolerating oral intake and is well-appearing.  She does have some tenderness to palpation of her epigastric and periumbilical area on exam.  She has no leukocytosis.  No symptoms consistent with UTI.  She has history of pancreatitis and reports this presentation feels quite similar. CT scan shows no acute abnormality.  Lab work overall reassuring.  When reassessed she is tolerating oral intake and reports that her pain is significantly improved.  Will send her home with GI follow-up and pain medication. I have reviewed the patients home medicines and have made adjustments as needed   Plan F/u w/ PCP in 2-3d to ensure resolution of sx.  Patient was given return precautions. Patient stable for discharge at this time.  Patient educated on sx/dx and verbalized understanding of plan. Return to ER w/ new or worsening sx.       Final diagnoses:  Epigastric pain    ED Discharge Orders          Ordered    ondansetron  (ZOFRAN -ODT) 4 MG disintegrating tablet  Every 8 hours PRN        08/22/23 1759    HYDROcodone -acetaminophen  (NORCO/VICODIN) 5-325 MG tablet  Every 6 hours PRN        08/22/23 1759    pantoprazole  (PROTONIX ) 20 MG tablet  Daily         08/22/23 1804               Jonesha Tsuchiya, Warren SAILOR, PA-C 08/22/23 1804    Laurice Maude BROCKS, MD 08/23/23 1939

## 2023-08-23 ENCOUNTER — Other Ambulatory Visit: Payer: Self-pay | Admitting: Family Medicine

## 2023-08-23 DIAGNOSIS — R1013 Epigastric pain: Secondary | ICD-10-CM

## 2023-09-23 ENCOUNTER — Other Ambulatory Visit: Payer: Self-pay | Admitting: Family Medicine

## 2023-09-23 DIAGNOSIS — E781 Pure hyperglyceridemia: Secondary | ICD-10-CM

## 2023-09-25 ENCOUNTER — Other Ambulatory Visit: Payer: Self-pay | Admitting: Nurse Practitioner

## 2023-09-25 ENCOUNTER — Telehealth: Payer: Self-pay | Admitting: Nurse Practitioner

## 2023-09-25 DIAGNOSIS — I10 Essential (primary) hypertension: Secondary | ICD-10-CM

## 2023-09-25 NOTE — Telephone Encounter (Signed)
 Pt confirmed appt 8/19

## 2023-09-26 ENCOUNTER — Ambulatory Visit: Attending: Nurse Practitioner | Admitting: Nurse Practitioner

## 2023-09-26 ENCOUNTER — Encounter: Payer: Self-pay | Admitting: Nurse Practitioner

## 2023-09-26 VITALS — BP 177/95 | HR 67 | Resp 18 | Ht 66.0 in | Wt 157.0 lb

## 2023-09-26 DIAGNOSIS — Z1211 Encounter for screening for malignant neoplasm of colon: Secondary | ICD-10-CM | POA: Diagnosis not present

## 2023-09-26 DIAGNOSIS — R1010 Upper abdominal pain, unspecified: Secondary | ICD-10-CM

## 2023-09-26 DIAGNOSIS — I10 Essential (primary) hypertension: Secondary | ICD-10-CM | POA: Diagnosis not present

## 2023-09-26 DIAGNOSIS — G629 Polyneuropathy, unspecified: Secondary | ICD-10-CM

## 2023-09-26 DIAGNOSIS — Z09 Encounter for follow-up examination after completed treatment for conditions other than malignant neoplasm: Secondary | ICD-10-CM

## 2023-09-26 MED ORDER — BLOOD PRESSURE CUFF MISC: 0 refills | Status: AC

## 2023-09-26 MED ORDER — PREGABALIN 75 MG PO CAPS: 75.0000 mg | ORAL_CAPSULE | Freq: Two times a day (BID) | ORAL | 1 refills | Status: AC

## 2023-09-26 MED ORDER — VALSARTAN 160 MG PO TABS: 160.0000 mg | ORAL_TABLET | Freq: Every day | ORAL | 1 refills | Status: DC

## 2023-09-26 NOTE — Progress Notes (Signed)
 Assessment & Plan:  Evelyn Crawford was seen today for hospitalization follow-up.  Diagnoses and all orders for this visit:  Primary hypertension -     Blood Pressure Monitoring (BLOOD PRESSURE CUFF) MISC; Use to check blood pressure once daily. -     CMP14+EGFR -     valsartan  (DIOVAN ) 160 MG tablet; Take 1 tablet (160 mg total) by mouth daily. Hypertension uncontrolled, contributing to headaches. Blood pressure management essential. - Increase valsartan  dosage from 80 mg to 160 mg daily  Colon cancer screening -     Ambulatory referral to Gastroenterology  Pain of upper abdomen -     Ambulatory referral to Gastroenterology  Peripheral polyneuropathy -     pregabalin  (LYRICA ) 75 MG capsule; Take 1 capsule (75 mg total) by mouth 2 (two) times daily. Chronic right lower extremity neuropathic pain Chronic neuropathic pain likely due to pinched nerve and peripheral neuropathy. Previous treatments ineffective. Alcohol may contribute. Neurology consultation needed. - Contact neurology for plan post nerve conduction study results - Increase Lyrica  dosage. - Send Lyrica  prescription to pharmacy.  Encounter for examination following treatment at hospital  Gastroesophageal reflux disease  GERD managed with omeprazole  or pantoprazole . Endoscopy recommended to rule out pathology as upper abdominal/epigastric pain persistent. - Refer to gastroenterology for endoscopy and colonoscopy. - Continue current acid reflux medication.  Depression Reports of depression, isolation, and lack of motivation. Previous behavioral health involvement noted.  General Health Maintenance Colonoscopy overdue since 2011. Screening necessary. - Refer to gastroenterology for colonoscopy.  Patient has been counseled on age-appropriate routine health concerns for screening and prevention. These are reviewed and up-to-date. Referrals have been placed accordingly. Immunizations are up-to-date or declined.    Subjective:    Chief Complaint  Patient presents with   Hospitalization Follow-up    Evelyn Crawford 63 y.o. female presents to office today for HFU with past medical history of alcohol abuse, chronic pancreatitis, tobacco abuse, hypertension    Evelyn Crawford presented to the hospital on August 22, 2023 with complaints of abdominal pain in the epigastric area.  She due to her history of pancreatitis and current alcohol use CT was ordered which showed no acute findings.  Potassium level was noted to be 3 and she was supplemented orally and discharged home with instructions to follow-up with GI.  She recalls a hospital visit where she was advised to have an endoscopy due to epigastric pain, and she reports being told that nothing was found. She has not had a colonoscopy in over ten years.   She describes persistent pain in her feet and legs, attributing it to a pinched nerve. Despite undergoing surgery for this condition, the pain continues. She has a history of a laminectomy in 2022 and has been evaluated for peripheral neuropathy and radiculopathy in her lower spine. She reports that nerve conduction studies were performed and that she was told she had a pinched nerve. She was previously on gabapentin  and Lyrica  (pregabalin ) for nerve pain, but she reports that these medications did not help.She experiences stinging, burning, and numbness in her right toe, which worsens at night and is exacerbated by cold touch. This pain has been present for years, particularly since a surgery on her toe.   HTN Blood pressure is currently elevated.  She endorses adherence with taking valsartan  80 mg daily. She has a history of alcohol use and reports that she doies not drink alcohol at the same time as taking her medications. She also reports difficulty sleeping due  to her leg pain, which she believes contributes to her elevated blood pressure. BP Readings from Last 3 Encounters:  09/26/23 (!) 177/95  08/22/23 (!) 154/80   02/08/23 (!) 155/87    Review of Systems  Constitutional:  Negative for fever, malaise/fatigue and weight loss.  HENT: Negative.  Negative for nosebleeds.   Eyes: Negative.  Negative for blurred vision, double vision and photophobia.  Respiratory: Negative.  Negative for cough and shortness of breath.   Cardiovascular: Negative.  Negative for chest pain, palpitations and leg swelling.  Gastrointestinal:  Positive for abdominal pain and heartburn. Negative for blood in stool, constipation, diarrhea, melena, nausea and vomiting.  Musculoskeletal: Negative.  Negative for myalgias.  Neurological:  Positive for tingling and sensory change. Negative for dizziness, focal weakness, seizures and headaches.  Psychiatric/Behavioral:  Positive for depression and substance abuse (etoh). Negative for suicidal ideas.     Past Medical History:  Diagnosis Date   Alcohol abuse    Hypertension    Iron deficiency anemia    Pancreatitis    Thrombocytopenia (HCC)    Tobacco abuse    Uterine fibroid     Past Surgical History:  Procedure Laterality Date   BACK SURGERY     CESAREAN SECTION     3X   CYSTOSCOPY  08/29/2022   Procedure: CYSTOSCOPY;  Surgeon: Evelyn Mays, MD;  Location: [facility];  Service: Gynecology;;   LAPAROTOMY WITH STAGING N/A 08/29/2022   Procedure: MINI LAPAROTOMY;  Surgeon: Evelyn Mays, MD;  Location: [facility];  Service: Gynecology;  Laterality: N/A;   ROBOTIC ASSISTED TOTAL HYSTERECTOMY WITH BILATERAL SALPINGO OOPHERECTOMY Bilateral 08/29/2022   Procedure: XI ROBOTIC ASSISTED TOTAL HYSTERECTOMY WITH BILATERAL SALPINGO OOPHORECTOMY GREATER THAN 250GRAMS;  Surgeon: Evelyn Mays, MD;  Location: [facility];  Service: Gynecology;  Laterality: Bilateral;    Family History  Problem Relation Age of Onset   Fibroids Mother    Heart attack Mother    Brain cancer Father    Fibroids Sister    Colon cancer Neg Hx    Stomach cancer Neg Hx    Esophageal cancer Neg Hx    Breast  cancer Neg Hx    Ovarian cancer Neg Hx    Endometrial cancer Neg Hx     Social History Reviewed with no changes to be made today.   Outpatient Medications Prior to Visit  Medication Sig Dispense Refill   atorvastatin  (LIPITOR) 20 MG tablet TAKE 1 TABLET BY MOUTH EVERY DAY 90 tablet 1   omeprazole  (PRILOSEC) 20 MG capsule Take 1 capsule (20 mg total) by mouth daily. Must have office visit for refills 30 capsule 0   ondansetron  (ZOFRAN -ODT) 4 MG disintegrating tablet Take 1 tablet (4 mg total) by mouth every 8 (eight) hours as needed for nausea or vomiting. 20 tablet 0   pantoprazole  (PROTONIX ) 20 MG tablet Take 1 tablet (20 mg total) by mouth daily. 30 tablet 0   valsartan  (DIOVAN ) 80 MG tablet TAKE 1 TABLET BY MOUTH EVERY DAY 90 tablet 0   HYDROcodone -acetaminophen  (NORCO/VICODIN) 5-325 MG tablet Take 1 tablet by mouth every 6 (six) hours as needed for severe pain (pain score 7-10). (Patient not taking: Reported on 09/26/2023) 10 tablet 0   ibuprofen  (ADVIL ) 800 MG tablet Take 1 tablet (800 mg total) by mouth every 8 (eight) hours as needed for moderate pain. For AFTER surgery only (Patient not taking: Reported on 09/26/2023) 60 tablet 1   KLOR-CON  M20 20 MEQ tablet TAKE 1 TABLET BY MOUTH EVERY  DAY (Patient not taking: Reported on 09/26/2023) 90 tablet 1   Multiple Vitamin (MULTIVITAMIN WITH MINERALS) TABS tablet Take 2 tablets by mouth daily. (Patient not taking: Reported on 09/26/2023)     senna-docusate (SENOKOT-S) 8.6-50 MG tablet Take 2 tablets by mouth at bedtime. For AFTER surgery, do not take if having diarrhea (Patient not taking: Reported on 09/26/2023) 30 tablet 0   Blood Pressure Monitoring (BLOOD PRESSURE CUFF) MISC Use to check blood pressure once daily. (Patient not taking: Reported on 09/26/2023) 1 each 0   No facility-administered medications prior to visit.    Allergies  Allergen Reactions   Ibuprofen  Nausea Only       Objective:    BP (!) 177/95 (BP Location: Left Arm,  Patient Position: Sitting, Cuff Size: Normal)   Pulse 67   Resp 18   Ht 5' 6 (1.676 m)   Wt 157 lb (71.2 kg)   LMP 09/17/2010   SpO2 100%   BMI 25.34 kg/m  Wt Readings from Last 3 Encounters:  09/26/23 157 lb (71.2 kg)  08/22/23 154 lb (69.9 kg)  01/28/23 156 lb (70.8 kg)    Physical Exam Vitals and nursing note reviewed.  Constitutional:      Appearance: She is well-developed.  HENT:     Head: Normocephalic and atraumatic.  Cardiovascular:     Rate and Rhythm: Normal rate and regular rhythm.     Heart sounds: Normal heart sounds. No murmur heard.    No friction rub. No gallop.  Pulmonary:     Effort: Pulmonary effort is normal. No tachypnea or respiratory distress.     Breath sounds: Normal breath sounds. No decreased breath sounds, wheezing, rhonchi or rales.  Chest:     Chest wall: No tenderness.  Musculoskeletal:        General: Normal range of motion.     Cervical back: Normal range of motion.  Skin:    General: Skin is warm and dry.  Neurological:     Mental Status: She is alert and oriented to person, place, and time.     Coordination: Coordination normal.  Psychiatric:        Behavior: Behavior normal. Behavior is cooperative.        Thought Content: Thought content normal.        Judgment: Judgment normal.          Patient has been counseled extensively about nutrition and exercise as well as the importance of adherence with medications and regular follow-up. The patient was given clear instructions to go to ER or return to medical center if symptoms don't improve, worsen or new problems develop. The patient verbalized understanding.   Follow-up: Return in about 3 months (around 12/27/2023).   Haze LELON Servant, FNP-BC [facility] and [facility] [place], [place] [phone]   10/15/2023, 6:48 AM

## 2023-09-27 ENCOUNTER — Ambulatory Visit: Payer: Self-pay | Admitting: Nurse Practitioner

## 2023-09-27 LAB — CMP14+EGFR
ALT: 16 IU/L (ref 0–32)
AST: 23 IU/L (ref 0–40)
Albumin: 4.2 g/dL (ref 3.9–4.9)
Alkaline Phosphatase: 85 IU/L (ref 44–121)
BUN/Creatinine Ratio: 13 (ref 12–28)
BUN: 9 mg/dL (ref 8–27)
Bilirubin Total: 0.4 mg/dL (ref 0.0–1.2)
CO2: 25 mmol/L (ref 20–29)
Calcium: 10.3 mg/dL (ref 8.7–10.3)
Chloride: 103 mmol/L (ref 96–106)
Creatinine, Ser: 0.68 mg/dL (ref 0.57–1.00)
Globulin, Total: 3 g/dL (ref 1.5–4.5)
Glucose: 101 mg/dL — ABNORMAL HIGH (ref 70–99)
Potassium: 3.7 mmol/L (ref 3.5–5.2)
Sodium: 142 mmol/L (ref 134–144)
Total Protein: 7.2 g/dL (ref 6.0–8.5)
eGFR: 98 mL/min/1.73 (ref >59)

## 2023-10-15 ENCOUNTER — Encounter: Payer: Self-pay | Admitting: Nurse Practitioner

## 2023-10-27 ENCOUNTER — Other Ambulatory Visit: Payer: Self-pay

## 2023-10-27 ENCOUNTER — Emergency Department (HOSPITAL_COMMUNITY)
Admission: EM | Admit: 2023-10-27 | Discharge: 2023-10-27 | Disposition: A | Discharge status: Home / Self Care | Attending: Emergency Medicine | Admitting: Emergency Medicine

## 2023-10-27 ENCOUNTER — Emergency Department (HOSPITAL_COMMUNITY)

## 2023-10-27 DIAGNOSIS — R1013 Epigastric pain: Secondary | ICD-10-CM | POA: Insufficient documentation

## 2023-10-27 DIAGNOSIS — I10 Essential (primary) hypertension: Secondary | ICD-10-CM | POA: Diagnosis not present

## 2023-10-27 DIAGNOSIS — Z79899 Other long term (current) drug therapy: Secondary | ICD-10-CM | POA: Diagnosis not present

## 2023-10-27 LAB — URINALYSIS, ROUTINE W REFLEX MICROSCOPIC
Bilirubin Urine: NEGATIVE
Glucose, UA: NEGATIVE mg/dL
Hgb urine dipstick: NEGATIVE
Ketones, ur: NEGATIVE mg/dL
Nitrite: NEGATIVE
Protein, ur: NEGATIVE mg/dL
Specific Gravity, Urine: 1.018 (ref 1.005–1.030)
pH: 7 (ref 5.0–8.0)

## 2023-10-27 LAB — CBC
HCT: 42.2 % (ref 36.0–46.0)
Hemoglobin: 13.1 g/dL (ref 12.0–15.0)
MCH: 29.7 pg (ref 26.0–34.0)
MCHC: 31 g/dL (ref 30.0–36.0)
MCV: 95.7 fL (ref 80.0–100.0)
Platelets: 158 K/uL (ref 150–400)
RBC: 4.41 MIL/uL (ref 3.87–5.11)
RDW: 12.3 % (ref 11.5–15.5)
WBC: 5.7 K/uL (ref 4.0–10.5)
nRBC: 0 % (ref 0.0–0.2)

## 2023-10-27 LAB — COMPREHENSIVE METABOLIC PANEL WITH GFR
ALT: 18 U/L (ref 0–44)
AST: 26 U/L (ref 15–41)
Albumin: 4.4 g/dL (ref 3.5–5.0)
Alkaline Phosphatase: 93 U/L (ref 38–126)
Anion gap: 12 (ref 5–15)
BUN: 8 mg/dL (ref 8–23)
CO2: 24 mmol/L (ref 22–32)
Calcium: 10.3 mg/dL (ref 8.9–10.3)
Chloride: 106 mmol/L (ref 98–111)
Creatinine, Ser: 0.67 mg/dL (ref 0.44–1.00)
GFR, Estimated: >60 mL/min (ref >60)
Glucose, Bld: 115 mg/dL — ABNORMAL HIGH (ref 70–99)
Potassium: 4.1 mmol/L (ref 3.5–5.1)
Sodium: 142 mmol/L (ref 135–145)
Total Bilirubin: 0.5 mg/dL (ref 0.0–1.2)
Total Protein: 7.6 g/dL (ref 6.5–8.1)

## 2023-10-27 LAB — LIPASE, BLOOD: Lipase: 15 U/L (ref 11–51)

## 2023-10-27 MED ORDER — ONDANSETRON 4 MG PO TBDP
4.0000 mg | ORAL_TABLET | Freq: Three times a day (TID) | ORAL | 0 refills | Status: DC | PRN
Start: 2023-10-27 — End: 2023-12-28

## 2023-10-27 MED ORDER — LACTATED RINGERS IV BOLUS
1000.0000 mL | Freq: Once | INTRAVENOUS | Status: AC
  Administered 2023-10-27: 1000 mL via INTRAVENOUS

## 2023-10-27 MED ORDER — HYDRALAZINE HCL 20 MG/ML IJ SOLN
10.0000 mg | Freq: Once | INTRAMUSCULAR | Status: AC
  Administered 2023-10-27: 10 mg via INTRAVENOUS
  Filled 2023-10-27: qty 1

## 2023-10-27 MED ORDER — MORPHINE SULFATE (PF) 4 MG/ML IV SOLN
4.0000 mg | Freq: Once | INTRAVENOUS | Status: AC
  Administered 2023-10-27: 4 mg via INTRAVENOUS
  Filled 2023-10-27: qty 1

## 2023-10-27 MED ORDER — PANTOPRAZOLE SODIUM 20 MG PO TBEC
20.0000 mg | DELAYED_RELEASE_TABLET | Freq: Every day | ORAL | 0 refills | Status: DC
Start: 2023-10-27 — End: 2023-12-28

## 2023-10-27 MED ORDER — IOHEXOL 300 MG/ML  SOLN
100.0000 mL | Freq: Once | INTRAMUSCULAR | Status: AC | PRN
  Administered 2023-10-27: 100 mL via INTRAVENOUS

## 2023-10-27 MED ORDER — ONDANSETRON HCL 4 MG/2ML IJ SOLN
4.0000 mg | Freq: Once | INTRAMUSCULAR | Status: AC
  Administered 2023-10-27: 4 mg via INTRAVENOUS
  Filled 2023-10-27: qty 2

## 2023-10-27 MED ORDER — HYDROCODONE-ACETAMINOPHEN 5-325 MG PO TABS: 1.0000 | ORAL_TABLET | Freq: Four times a day (QID) | ORAL | 0 refills | Status: AC | PRN

## 2023-10-27 NOTE — Discharge Instructions (Signed)
 As we discussed, your workup in the ER today was overall reassuring for acute findings.  Laboratory evaluation and CT imaging did not reveal any emergent cause of your pain.  I suspect that your pain is related to your alcohol use, I do strongly suggest that you stop drinking alcohol to help reduce your chance of recurrence of the symptoms.  In the interim, I have given you a prescription for Zofran  which is a nausea medication you can take, Protonix  which is for stomach acid, and Norco which she can take as prescribed as needed for severe pain only.  Do not drive or operate heavy machinery while taking this medications.  Please call your PCP to schedule close follow-up appointment as well as your GI specialist.  Return if development of any new or worsening symptoms.

## 2023-10-27 NOTE — ED Triage Notes (Addendum)
 Pt has c/o lower abdominal pain, nausea. Pt has hx of pancreatitis- states she drank a little alcohol last night. Pt took hydrocodone  for the pain around 0615 with no relief. Pt BP is elevated in triage, pt states she has not yet taken her medication.

## 2023-10-27 NOTE — ED Provider Notes (Signed)
 [place] EMERGENCY DEPARTMENT AT [facility] Provider Note   CSN: [id number] Arrival date & time: 10/27/23  1150     Patient presents with: Abdominal Pain   [name] is a 63 y.o. female.   Patient with history of alcohol abuse, hypertension, pancreatitis presents today with complaints of abdominal pain.  She reports that she drank alcohol last night and this morning woke up with epigastric abdominal pain consistent with her previous flares of pancreatitis.  She reports some nausea without vomiting.  Denies diarrhea.  No fevers or chills.  She is notably hypertensive, reports she missed her morning antihypertensive medicines.  The history is provided by the patient. No language interpreter was used.  Abdominal Pain      Prior to Admission medications   Medication Sig Start Date End Date Taking? Authorizing Provider  atorvastatin  (LIPITOR) 20 MG tablet TAKE 1 TABLET BY MOUTH EVERY DAY 09/24/23   Fleming, Zelda W, NP  Blood Pressure Monitoring (BLOOD PRESSURE CUFF) MISC Use to check blood pressure once daily. 09/26/23   Fleming, Zelda W, NP  HYDROcodone -acetaminophen  (NORCO/VICODIN) 5-325 MG tablet Take 1 tablet by mouth every 6 (six) hours as needed for severe pain (pain score 7-10). Patient not taking: Reported on 09/26/2023 08/22/23   Barrett, Jamie N, PA-C  ibuprofen  (ADVIL ) 800 MG tablet Take 1 tablet (800 mg total) by mouth every 8 (eight) hours as needed for moderate pain. For AFTER surgery only Patient not taking: Reported on 09/26/2023 09/06/22   Theotis Haze ORN, NP  KLOR-CON  M20 20 MEQ tablet TAKE 1 TABLET BY MOUTH EVERY DAY Patient not taking: Reported on 09/26/2023 03/08/23   Fleming, Zelda W, NP  Multiple Vitamin (MULTIVITAMIN WITH MINERALS) TABS tablet Take 2 tablets by mouth daily. Patient not taking: Reported on 09/26/2023    [provider]  omeprazole  (PRILOSEC) 20 MG capsule Take 1 capsule (20 mg total) by mouth daily. Must have office visit for  refills 06/14/23   Newlin, Enobong, MD  ondansetron  (ZOFRAN -ODT) 4 MG disintegrating tablet Take 1 tablet (4 mg total) by mouth every 8 (eight) hours as needed for nausea or vomiting. 08/22/23   Barrett, Warren SAILOR, PA-C  pantoprazole  (PROTONIX ) 20 MG tablet Take 1 tablet (20 mg total) by mouth daily. 08/22/23   Barrett, Warren SAILOR, PA-C  pregabalin  (LYRICA ) 75 MG capsule Take 1 capsule (75 mg total) by mouth 2 (two) times daily. 09/26/23   Fleming, Zelda W, NP  senna-docusate (SENOKOT-S) 8.6-50 MG tablet Take 2 tablets by mouth at bedtime. For AFTER surgery, do not take if having diarrhea Patient not taking: Reported on 09/26/2023 08/21/22   Micheline Setter D, NP  valsartan  (DIOVAN ) 160 MG tablet Take 1 tablet (160 mg total) by mouth daily. 09/26/23   Theotis Haze ORN, NP    Allergies: Ibuprofen     Review of Systems  Gastrointestinal:  Positive for abdominal pain.  All other systems reviewed and are negative.   Updated Vital Signs BP (!) 173/94   Pulse 63   Temp 97.9 F (36.6 C) (Oral)   Resp 18   Ht 5' 7 (1.702 m)   Wt 69.9 kg   LMP 09/17/2010   SpO2 100%   BMI 24.12 kg/m   Physical Exam Vitals and nursing note reviewed.  Constitutional:      General: She is not in acute distress.    Appearance: Normal appearance. She is normal weight. She is not ill-appearing, toxic-appearing or diaphoretic.  HENT:  Head: Normocephalic and atraumatic.  Cardiovascular:     Rate and Rhythm: Normal rate.  Pulmonary:     Effort: Pulmonary effort is normal. No respiratory distress.  Abdominal:     General: Abdomen is flat.     Palpations: Abdomen is soft.     Tenderness: There is abdominal tenderness in the epigastric area.  Musculoskeletal:        General: Normal range of motion.     Cervical back: Normal range of motion.  Skin:    General: Skin is warm and dry.  Neurological:     General: No focal deficit present.     Mental Status: She is alert.  Psychiatric:        Mood and Affect: Mood  normal.        Behavior: Behavior normal.     (all labs ordered are listed, but only abnormal results are displayed) Labs Reviewed  COMPREHENSIVE METABOLIC PANEL WITH GFR - Abnormal; Notable for the following components:      Result Value   Glucose, Bld 115 (*)    All other components within normal limits  URINALYSIS, ROUTINE W REFLEX MICROSCOPIC - Abnormal; Notable for the following components:   APPearance HAZY (*)    Leukocytes,Ua TRACE (*)    Bacteria, UA RARE (*)    All other components within normal limits  LIPASE, BLOOD  CBC    EKG: None  Radiology: CT ABDOMEN PELVIS W CONTRAST Result Date: 10/27/2023 CLINICAL DATA:  Acute abdominal pain. EXAM: CT ABDOMEN AND PELVIS WITH CONTRAST TECHNIQUE: Multidetector CT imaging of the abdomen and pelvis was performed using the standard protocol following bolus administration of intravenous contrast. RADIATION DOSE REDUCTION: This exam was performed according to the departmental dose-optimization program which includes automated exposure control, adjustment of the mA and/or kV according to patient size and/or use of iterative reconstruction technique. CONTRAST:  OMNIPAQUE  IOHEXOL  300 MG/ML  SOLN COMPARISON:  CT abdomen and pelvis 08/22/2023. FINDINGS: Lower chest: No acute abnormality. Hepatobiliary: The liver is mildly enlarged. There is mild diffuse fatty infiltration. No focal liver lesions are seen. Gallbladder and bile ducts are within normal limits. Pancreas: Unremarkable. No pancreatic ductal dilatation or surrounding inflammatory changes. Spleen: Normal in size without focal abnormality. Adrenals/Urinary Tract: There is a rounded 2.7 cm left adrenal mass which appears unchanged from prior. Right adrenal gland is within normal limits. The bilateral kidneys appear within normal limits. The bladder is within normal limits. Stomach/Bowel: Stomach is within normal limits. Appendix appears normal. No evidence of bowel wall thickening,  distention, or inflammatory changes. There are scattered colonic diverticula. Vascular/Lymphatic: Aortic atherosclerosis. No enlarged abdominal or pelvic lymph nodes. Reproductive: Status post hysterectomy. No adnexal masses. Other: There is trace free fluid in the pelvis. Musculoskeletal: No acute or significant osseous findings. IMPRESSION: 1. No acute localizing process in the abdomen or pelvis. 2. Trace free fluid in the pelvis. 3. Stable left adrenal mass. 4. Colonic diverticulosis. 5. Mild hepatomegaly and hepatic steatosis. 6. Aortic atherosclerosis. Aortic Atherosclerosis (ICD10-I70.0). Electronically Signed   By: Greig Pique M.D.   On: 10/27/2023 15:41     Procedures   Medications Ordered in the ED  morphine  (PF) 4 MG/ML injection 4 mg (4 mg Intravenous Given 10/27/23 1427)  ondansetron  (ZOFRAN ) injection 4 mg (4 mg Intravenous Given 10/27/23 1427)  lactated ringers  bolus 1,000 mL (1,000 mLs Intravenous New Bag/Given 10/27/23 1426)  hydrALAZINE  (APRESOLINE ) injection 10 mg (10 mg Intravenous Given 10/27/23 1426)  iohexol  (OMNIPAQUE ) 300 MG/ML solution  100 mL (100 mLs Intravenous Contrast Given 10/27/23 1502)                                    Medical Decision Making Amount and/or Complexity of Data Reviewed Labs: ordered. Radiology: ordered.  Risk Prescription drug management.   This patient is a 63 y.o. female who presents to the ED for concern of abdominal pain, this involves an extensive number of treatment options, and is a complaint that carries with it a high risk of complications and morbidity. The emergent differential diagnosis prior to evaluation includes, but is not limited to,  AAA, gastroenteritis, appendicitis, Bowel obstruction, Bowel perforation. Gastroparesis, DKA, Hernia, Inflammatory bowel disease, mesenteric ischemia, pancreatitis, peritonitis SBP, volvulus.  This is not an exhaustive differential.   Past Medical History / Co-morbidities / Social History:  has  a past medical history of Alcohol abuse, Hypertension, Iron deficiency anemia, Pancreatitis, Thrombocytopenia (HCC), Tobacco abuse, and Uterine fibroid.  Additional history: Chart reviewed. Pertinent results include: seen in July 2025 for similar complaints, had normal labs and imaging, symptoms were attributed with chronic pancreatitis and was discharged with pain and nausea medicines  Physical Exam: Physical exam performed. The pertinent findings include: well appearing, no vomiting on exam, epigastric abdominal TTP without rebound or guarding  Lab Tests: I ordered, and personally interpreted labs.  The pertinent results include:  no leukocytosis, Lipase WNL, UA with trace leukocytes, rare bacteria but no WBCs. Given no symptoms, likely noninfectious.   Imaging Studies: I ordered imaging studies including CT abdomen pelvis. I independently visualized and interpreted imaging which showed   1. No acute localizing process in the abdomen or pelvis. 2. Trace free fluid in the pelvis. 3. Stable left adrenal mass. 4. Colonic diverticulosis. 5. Mild hepatomegaly and hepatic steatosis. 6. Aortic atherosclerosis.   Aortic Atherosclerosis  I agree with the radiologist interpretation.  Medications: I ordered medication including morphine , zofran , IV fluids, hydralazine   for pain, nausea, dehydration, hypertension. Reevaluation of the patient after these medicines showed that the patient improved. I have reviewed the patients home medicines and have made adjustments as needed.   Disposition: After consideration of the diagnostic results and the patients response to treatment, I feel that emergency department workup does not suggest an emergent condition requiring admission or immediate intervention beyond what has been performed at this time. The plan is: discharge with close outpatient follow-up and return precautions.  Patient's workup is benign, after above intervention she is feels better and  ready to go home.  Will give pain medicine and nausea medicine for symptomatic control.  PDMP reviewed.  Patient advised not to drive or operate heavy machinery while taking narcotic pain medicines.  Also educated the patient on alcohol cessation, seems like that alcohol consumption is what causes her symptoms to flare. Will give PPI to cover for gastritis as well. She has no signs or symptoms to suggest ACS or cardiac cause of symptoms. Patient is still mildly hypertensive, did significantly improve with hydralazine , likely due to missed medications.  Discussed same with patient, she plans to take her medicines as soon as she gets home which is reasonable.  Therefore, no indication for further antihypertensive medicine intervention at this time. Evaluation and diagnostic testing in the emergency department does not suggest an emergent condition requiring admission or immediate intervention beyond what has been performed at this time.  Plan for discharge with close PCP follow-up.  Patient  is understanding and amenable with plan, educated on red flag symptoms that would prompt immediate return.  Patient discharged in stable condition.  Final diagnoses:  Epigastric pain  Elevated blood pressure reading with diagnosis of hypertension    ED Discharge Orders          Ordered    ondansetron  (ZOFRAN -ODT) 4 MG disintegrating tablet  Every 8 hours PRN        10/27/23 1733    pantoprazole  (PROTONIX ) 20 MG tablet  Daily        10/27/23 1733    HYDROcodone -acetaminophen  (NORCO/VICODIN) 5-325 MG tablet  Every 6 hours PRN        10/27/23 1733          An After Visit Summary was printed and given to the patient.      Payten Beaumier A, PA-C 10/27/23 1735    Armenta Canning, MD 10/28/23 1328

## 2023-12-28 ENCOUNTER — Ambulatory Visit: Attending: Nurse Practitioner | Admitting: Nurse Practitioner

## 2023-12-28 ENCOUNTER — Encounter: Payer: Self-pay | Admitting: Nurse Practitioner

## 2023-12-28 VITALS — BP 190/101 | HR 74 | Resp 18 | Ht 67.0 in | Wt 156.8 lb

## 2023-12-28 DIAGNOSIS — K219 Gastro-esophageal reflux disease without esophagitis: Secondary | ICD-10-CM

## 2023-12-28 DIAGNOSIS — I10 Essential (primary) hypertension: Secondary | ICD-10-CM

## 2023-12-28 MED ORDER — PANTOPRAZOLE SODIUM 20 MG PO TBEC: 20.0000 mg | DELAYED_RELEASE_TABLET | Freq: Every day | ORAL | 1 refills | Status: AC

## 2023-12-28 NOTE — Progress Notes (Signed)
 Assessment & Plan:  Evelyn Crawford was seen today for hypertension.  Diagnoses and all orders for this visit:  Primary hypertension Ongoing management with valsartan  160 mg. Blood pressure to be rechecked in two weeks. Potential increase to 320 mg if elevated. - Continue valsartan  160 mg daily for two weeks. - Recheck blood pressure in two weeks. - Increase valsartan  to 320 mg daily if blood pressure remains high.  GERD without esophagitis -     pantoprazole  (PROTONIX ) 20 MG tablet; Take 1 tablet (20 mg total) by mouth daily. FOR ACID REFLUX INSTRUCTIONS: Avoid GERD Triggers: acidic, spicy or fried foods, caffeine, coffee, sodas,  alcohol and chocolate.     Patient has been counseled on age-appropriate routine health concerns for screening and prevention. These are reviewed and up-to-date. Referrals have been placed accordingly. Immunizations are up-to-date or declined.    Subjective:   Chief Complaint  Patient presents with   Hypertension    Evelyn Crawford 63 y.o. female presents to office today for follow up to HTN  VRI was used to communicate directly with patient for the entire encounter including providing detailed patient instructions.    PMH: HTN, GERD, HPL   HTN Blood pressure is elevated today. She did not start taking valsartan  160 mg until the past week. She was supposed to start taking it in August  (3 months ago) after her last office visit with me. She only continued valsartan  80 mg daily at that time. She continues to smoke cigarettes daily.  BP Readings from Last 3 Encounters:  12/28/23 (!) 190/101  10/27/23 (!) 176/85  09/26/23 (!) 177/95     GERD She is requesting refill of pantoprazole . Notes full resolution of symptoms with taking PPI.  LIPID LDL at goal with atorvastatin  20 mg daily Lab Results  Component Value Date   LDLCALC 78 12/06/2021     She experiences red eyes every morning and has been using a generic allergy eye drops. She saw an eye doctor  earlier this year and was prescribed eye drops that were too expensive to pick up from the pharmacy.    She has not had a mammogram  and declines referral today. She was referred for colonoscopy and the GI office was unable to contact her.   Review of Systems  Constitutional:  Negative for fever, malaise/fatigue and weight loss.  HENT: Negative.  Negative for nosebleeds.   Eyes:  Positive for redness. Negative for blurred vision, double vision, photophobia, pain and discharge.  Respiratory: Negative.  Negative for cough and shortness of breath.   Cardiovascular: Negative.  Negative for chest pain, palpitations and leg swelling.  Gastrointestinal:  Positive for heartburn. Negative for nausea and vomiting.  Musculoskeletal: Negative.  Negative for myalgias.  Neurological: Negative.  Negative for dizziness, focal weakness, seizures and headaches.  Psychiatric/Behavioral: Negative.  Negative for suicidal ideas.     Past Medical History:  Diagnosis Date   Alcohol abuse    Hypertension    Iron deficiency anemia    Pancreatitis    Thrombocytopenia    Tobacco abuse    Uterine fibroid     Past Surgical History:  Procedure Laterality Date   BACK SURGERY     CESAREAN SECTION     3X   CYSTOSCOPY  08/29/2022   Procedure: CYSTOSCOPY;  Surgeon: Eldonna Mays, MD;  Location: [facility];  Service: Gynecology;;   LAPAROTOMY WITH STAGING N/A 08/29/2022   Procedure: MINI LAPAROTOMY;  Surgeon: Eldonna Mays, MD;  Location: [facility];  Service: Gynecology;  Laterality: N/A;   ROBOTIC ASSISTED TOTAL HYSTERECTOMY WITH BILATERAL SALPINGO OOPHERECTOMY Bilateral 08/29/2022   Procedure: XI ROBOTIC ASSISTED TOTAL HYSTERECTOMY WITH BILATERAL SALPINGO OOPHORECTOMY GREATER THAN 250GRAMS;  Surgeon: Eldonna Mays, MD;  Location: [facility];  Service: Gynecology;  Laterality: Bilateral;    Family History  Problem Relation Age of Onset   Fibroids Mother    Heart attack Mother    Brain cancer Father    Fibroids  Sister    Colon cancer Neg Hx    Stomach cancer Neg Hx    Esophageal cancer Neg Hx    Breast cancer Neg Hx    Ovarian cancer Neg Hx    Endometrial cancer Neg Hx     Social History Reviewed with no changes to be made today.   Outpatient Medications Prior to Visit  Medication Sig Dispense Refill   atorvastatin  (LIPITOR) 20 MG tablet TAKE 1 TABLET BY MOUTH EVERY DAY 90 tablet 1   Blood Pressure Monitoring (BLOOD PRESSURE CUFF) MISC Use to check blood pressure once daily. 1 each 0   HYDROcodone -acetaminophen  (NORCO/VICODIN) 5-325 MG tablet Take 1 tablet by mouth every 6 (six) hours as needed for severe pain (pain score 7-10). 10 tablet 0   pregabalin  (LYRICA ) 75 MG capsule Take 1 capsule (75 mg total) by mouth 2 (two) times daily. 60 capsule 1   valsartan  (DIOVAN ) 160 MG tablet Take 1 tablet (160 mg total) by mouth daily. 90 tablet 1   senna-docusate (SENOKOT-S) 8.6-50 MG tablet Take 2 tablets by mouth at bedtime. For AFTER surgery, do not take if having diarrhea (Patient not taking: Reported on 12/28/2023) 30 tablet 0   ibuprofen  (ADVIL ) 800 MG tablet Take 1 tablet (800 mg total) by mouth every 8 (eight) hours as needed for moderate pain. For AFTER surgery only (Patient not taking: Reported on 12/28/2023) 60 tablet 1   KLOR-CON  M20 20 MEQ tablet TAKE 1 TABLET BY MOUTH EVERY DAY (Patient not taking: Reported on 12/28/2023) 90 tablet 1   Multiple Vitamin (MULTIVITAMIN WITH MINERALS) TABS tablet Take 2 tablets by mouth daily. (Patient not taking: Reported on 12/28/2023)     omeprazole  (PRILOSEC) 20 MG capsule Take 1 capsule (20 mg total) by mouth daily. Must have office visit for refills (Patient not taking: Reported on 12/28/2023) 30 capsule 0   ondansetron  (ZOFRAN -ODT) 4 MG disintegrating tablet Take 1 tablet (4 mg total) by mouth every 8 (eight) hours as needed. (Patient not taking: Reported on 12/28/2023) 20 tablet 0   pantoprazole  (PROTONIX ) 20 MG tablet Take 1 tablet (20 mg total) by mouth  daily. (Patient not taking: Reported on 12/28/2023) 14 tablet 0   No facility-administered medications prior to visit.    Allergies  Allergen Reactions   Ibuprofen  Nausea Only       Objective:    BP (!) 190/101 (BP Location: Left Arm, Patient Position: Sitting, Cuff Size: Normal)   Pulse 74   Resp 18   Ht 5' 7 (1.702 m)   Wt 156 lb 12.8 oz (71.1 kg)   LMP 09/17/2010   SpO2 100%   BMI 24.56 kg/m  Wt Readings from Last 3 Encounters:  12/28/23 156 lb 12.8 oz (71.1 kg)  10/27/23 154 lb (69.9 kg)  09/26/23 157 lb (71.2 kg)    Physical Exam Vitals and nursing note reviewed.  Constitutional:      Appearance: She is well-developed.  HENT:     Head: Normocephalic and atraumatic.  Cardiovascular:     Rate  and Rhythm: Normal rate and regular rhythm.     Heart sounds: Normal heart sounds. No murmur heard.    No friction rub. No gallop.  Pulmonary:     Effort: Pulmonary effort is normal. No tachypnea or respiratory distress.     Breath sounds: Normal breath sounds. No decreased breath sounds, wheezing, rhonchi or rales.  Chest:     Chest wall: No tenderness.  Musculoskeletal:        General: Normal range of motion.     Cervical back: Normal range of motion.  Skin:    General: Skin is warm and dry.  Neurological:     Mental Status: She is alert and oriented to person, place, and time.     Coordination: Coordination normal.  Psychiatric:        Behavior: Behavior normal. Behavior is cooperative.        Thought Content: Thought content normal.        Judgment: Judgment normal.          Patient has been counseled extensively about nutrition and exercise as well as the importance of adherence with medications and regular follow-up. The patient was given clear instructions to go to ER or return to medical center if symptoms don't improve, worsen or new problems develop. The patient verbalized understanding.   Follow-up: Return in about 3 months (around 03/29/2024) for see  me in 3 months. F/U with nurse in 2 weeks for BP check.   Haze LELON Servant, FNP-BC [facility] [place], [place] [phone]   12/28/2023, 5:25 PM

## 2023-12-28 NOTE — Patient Instructions (Addendum)
 Placed in [facility]  [address] [place], [place] [address] PH# [phone]   LUMIFY for red eyes

## 2024-01-11 NOTE — Progress Notes (Deleted)
 Chief Complaint: Hospital follow-up Primary GI MD: Dr. Abran  HPI: 63 year old female history of hypertension, chronic alcohol abuse, presents for evaluation of Hospital follow-up  Last seen by Dr. Abran 07/2022 for a hospital follow-up for acute pancreatitis.  Longstanding history of chronic alcohol abuse.  See this note for details.  Set up for MRCP 08/2022 which showed benign looking cyst on the pancreas and a repeat MRI in 1 year.  Since the above she has had 3 CT scans with most recent one being 10/27/2023 which showed fatty liver, mild hepatomegaly, normal gallbladder, normal pancreas.  Stable adrenal mass.  Recent labs with normal lipase, CMP, CBC   Discussed the use of AI scribe software for clinical note transcription with the patient, who gave verbal consent to proceed.  History of Present Illness      PREVIOUS GI WORKUP   CT abdomen pelvis with contrast 10/27/2023 IMPRESSION: 1. No acute localizing process in the abdomen or pelvis. 2. Trace free fluid in the pelvis. 3. Stable left adrenal mass. 4. Colonic diverticulosis. 5. Mild hepatomegaly and hepatic steatosis. 6. Aortic atherosclerosis.  MRCP 08/2022 IMPRESSION: 1. No acute abnormality. No cholelithiasis. No biliary ductal dilatation. No choledocholithiasis. 2. Superior pancreatic body 1.0 cm septated cystic lesion with communication with the main pancreatic duct, compatible with side branch IPMN. No high risk MRI features. Follow-up MRI abdomen without and with IV contrast is recommended in 1 year. This recommendation follows ACR consensus guidelines: Management of Incidental Pancreatic Cysts: A White Paper of the ACR Incidental Findings Committee. J Am Coll Radiol 2017;14:911-923. 3. Benign left adrenal adenoma, for which no follow-up imaging is recommended. 4. Partially visualized enlarged myomatous uterus, better seen on recent CT study; please see 07/06/2022 CT abdomen/pelvis report for follow-up  recommendations regarding pelvic findings.    Past Medical History:  Diagnosis Date   Alcohol abuse    Fatty liver    Hypertension    Iron deficiency anemia    Pancreatitis    Thrombocytopenia    Tobacco abuse    Uterine fibroid     Past Surgical History:  Procedure Laterality Date   BACK SURGERY     CESAREAN SECTION     3X   CYSTOSCOPY  08/29/2022   Procedure: CYSTOSCOPY;  Surgeon: Eldonna Mays, MD;  Location: [facility];  Service: Gynecology;;   LAPAROTOMY WITH STAGING N/A 08/29/2022   Procedure: MINI LAPAROTOMY;  Surgeon: Eldonna Mays, MD;  Location: [facility];  Service: Gynecology;  Laterality: N/A;   ROBOTIC ASSISTED TOTAL HYSTERECTOMY WITH BILATERAL SALPINGO OOPHERECTOMY Bilateral 08/29/2022   Procedure: XI ROBOTIC ASSISTED TOTAL HYSTERECTOMY WITH BILATERAL SALPINGO OOPHORECTOMY GREATER THAN 250GRAMS;  Surgeon: Eldonna Mays, MD;  Location: [facility];  Service: Gynecology;  Laterality: Bilateral;    Current Outpatient Medications  Medication Sig Dispense Refill   atorvastatin  (LIPITOR) 20 MG tablet TAKE 1 TABLET BY MOUTH EVERY DAY 90 tablet 1   Blood Pressure Monitoring (BLOOD PRESSURE CUFF) MISC Use to check blood pressure once daily. 1 each 0   HYDROcodone -acetaminophen  (NORCO/VICODIN) 5-325 MG tablet Take 1 tablet by mouth every 6 (six) hours as needed for severe pain (pain score 7-10). 10 tablet 0   pantoprazole  (PROTONIX ) 20 MG tablet Take 1 tablet (20 mg total) by mouth daily. FOR ACID REFLUX 90 tablet 1   pregabalin  (LYRICA ) 75 MG capsule Take 1 capsule (75 mg total) by mouth 2 (two) times daily. 60 capsule 1   senna-docusate (SENOKOT-S) 8.6-50 MG tablet Take 2 tablets by mouth at  bedtime. For AFTER surgery, do not take if having diarrhea (Patient not taking: Reported on 12/28/2023) 30 tablet 0   valsartan  (DIOVAN ) 160 MG tablet Take 1 tablet (160 mg total) by mouth daily. 90 tablet 1   No current facility-administered medications for this visit.    Allergies as of  01/14/2024 - Review Complete 12/28/2023  Allergen Reaction Noted   Ibuprofen  Nausea Only 09/06/2022    Family History  Problem Relation Age of Onset   Fibroids Mother    Heart attack Mother    Brain cancer Father    Fibroids Sister    Colon cancer Neg Hx    Stomach cancer Neg Hx    Esophageal cancer Neg Hx    Breast cancer Neg Hx    Ovarian cancer Neg Hx    Endometrial cancer Neg Hx     Social History   Socioeconomic History   Marital status: Single    Spouse name: Not on file   Number of children: 3   Years of education: Not on file   Highest education level: Not on file  Occupational History   Occupation: biomedical scientist  Tobacco Use   Smoking status: Every Day    Current packs/day: 0.25    Average packs/day: 0.3 packs/day for 23.0 years (5.8 ttl pk-yrs)    Types: Cigarettes   Smokeless tobacco: Never  Vaping Use   Vaping status: Never Used  Substance and Sexual Activity   Alcohol use: Yes    Comment: daily 24 ounce beer every night drink liquor on weekend (Occ)   Drug use: No   Sexual activity: Yes    Birth control/protection: Post-menopausal  Other Topics Concern   Not on file  Social History Narrative   Patient needs to submit further paperwork to complete   Barnie Potters  November 29, 2009 11:51 AM      Unemployed   Previously worked at a school for 11 years, 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  Lives with grown daughter.  Pt is single, sexually active with men only.          Social Drivers of Corporate Investment Banker Strain: Low Risk  (07/25/2022)   Received from [facility]   Overall Financial Resource Strain (CARDIA)    Difficulty of Paying Living Expenses: Not hard at all  Food Insecurity: Food Insecurity Present (01/01/2023)   Hunger Vital Sign    Worried About Running Out of Food in the Last Year: Sometimes true    Ran Out of Food in the Last Year: Sometimes true  Transportation Needs: No Transportation Needs (07/25/2022)   Received  from [facility] - Transportation    Lack of Transportation (Medical): No    Lack of Transportation (Non-Medical): No  Physical Activity: Inactive (01/01/2023)   Exercise Vital Sign    Days of Exercise per Week: 0 days    Minutes of Exercise per Session: 0 min  Stress: No Stress Concern Present (01/01/2023)   [employer] of Occupational Health - Occupational Stress Questionnaire    Feeling of Stress : Not at all  Social Connections: Socially Isolated (01/01/2023)   Social Connection and Isolation Panel    Frequency of Communication with Friends and Family: More than three times a week    Frequency of Social Gatherings with Friends and Family: Twice a week    Attends Religious Services: Never    Database Administrator or Organizations: No  Attends Banker Meetings: Never    Marital Status: Never married  Catering Manager Violence: Not on file    Review of Systems:    Constitutional: No weight loss, fever, chills, weakness or fatigue HEENT: Eyes: No change in vision               Ears, Nose, Throat:  No change in hearing or congestion Skin: No rash or itching Cardiovascular: No chest pain, chest pressure or palpitations   Respiratory: No SOB or cough Gastrointestinal: See HPI and otherwise negative Genitourinary: No dysuria or change in urinary frequency Neurological: No headache, dizziness or syncope Musculoskeletal: No new muscle or joint pain Hematologic: No bleeding or bruising Psychiatric: No history of depression or anxiety    Physical Exam:  Vital signs: LMP 09/17/2010   Constitutional: NAD, alert and cooperative Head:  Normocephalic and atraumatic. Eyes:   PEERL, EOMI. No icterus. Conjunctiva pink. Respiratory: Respirations even and unlabored. Lungs clear to auscultation bilaterally.   No wheezes, crackles, or rhonchi.  Cardiovascular:  Regular rate and rhythm. No peripheral edema, cyanosis or pallor.  Gastrointestinal:  Soft,  nondistended, nontender. No rebound or guarding. Normal bowel sounds. No appreciable masses or hepatomegaly. Rectal:  Declines Msk:  Symmetrical without gross deformities. Without edema, no deformity or joint abnormality.  Neurologic:  Alert and  oriented x4;  grossly normal neurologically.  Skin:   Dry and intact without significant lesions or rashes. Psychiatric: Oriented to person, place and time. Demonstrates good judgement and reason without abnormal affect or behaviors.  Physical Exam    RELEVANT LABS AND IMAGING: CBC    Component Value Date/Time   WBC 5.7 10/27/2023 1205   RBC 4.41 10/27/2023 1205   HGB 13.1 10/27/2023 1205   HGB 12.5 12/06/2021 1638   HCT 42.2 10/27/2023 1205   HCT 37.4 12/06/2021 1638   PLT 158 10/27/2023 1205   PLT 161 12/06/2021 1638   MCV 95.7 10/27/2023 1205   MCV 92 12/06/2021 1638   MCH 29.7 10/27/2023 1205   MCHC 31.0 10/27/2023 1205   RDW 12.3 10/27/2023 1205   RDW 12.3 12/06/2021 1638   LYMPHSABS 2.4 12/06/2021 1638   MONOABS 0.5 08/02/2010 1606   EOSABS 0.1 12/06/2021 1638   BASOSABS 0.0 12/06/2021 1638    CMP     Component Value Date/Time   NA 142 10/27/2023 1205   NA 142 09/26/2023 1558   K 4.1 10/27/2023 1205   CL 106 10/27/2023 1205   CO2 24 10/27/2023 1205   GLUCOSE 115 (H) 10/27/2023 1205   BUN 8 10/27/2023 1205   BUN 9 09/26/2023 1558   CREATININE 0.67 10/27/2023 1205   CREATININE 0.68 11/02/2010 1346   CALCIUM  10.3 10/27/2023 1205   PROT 7.6 10/27/2023 1205   PROT 7.2 09/26/2023 1558   ALBUMIN 4.4 10/27/2023 1205   ALBUMIN 4.2 09/26/2023 1558   AST 26 10/27/2023 1205   ALT 18 10/27/2023 1205   ALKPHOS 93 10/27/2023 1205   BILITOT 0.5 10/27/2023 1205   BILITOT 0.4 09/26/2023 1558   GFRNONAA >60 10/27/2023 1205   GFRAA 109 01/16/2019 1101     Assessment/Plan:   Epigastric pain CTAP with contrast 10/27/2023 with no abnormalities.  Normal CBC/CMP/lipase.  No previous EGD  Pancreatic cyst Pancreatic cyst on  MRCP 08/2022 with recall of 1 year -MRCP for further evaluation  Colon cancer screening Normal colonoscopy 2011  On chronic pain medication (Vicodin)  Sharnay Cashion Mollie [facility]ON[facility] 01/11/2024, 11:11 AM  Cc: Fleming, Zelda  W, NP

## 2024-01-14 ENCOUNTER — Ambulatory Visit: Admitting: Gastroenterology

## 2024-01-18 ENCOUNTER — Ambulatory Visit

## 2024-01-18 ENCOUNTER — Telehealth: Payer: Self-pay | Admitting: Nurse Practitioner

## 2024-01-18 NOTE — Telephone Encounter (Signed)
 Copied from CRM #[id number]. Topic: General - Other >> Jan 18, 2024 10:27 AM Delon HERO wrote:  Reason for CRM: Patient is calling to report that she will cancel her 2- week BP nurse visit today Due to her riding the bus. Patient will take her BP on Monday and call back and report the reading.

## 2024-01-18 NOTE — Telephone Encounter (Signed)
Noted, FYI

## 2024-01-22 ENCOUNTER — Ambulatory Visit: Payer: Self-pay

## 2024-01-22 NOTE — Telephone Encounter (Signed)
 Please call pt to advise on medication. Prefers call (not MyChart).  FYI Only or Action Required?: Action required by provider: clinical question for provider and update on patient condition.  Patient was last seen in primary care on 12/28/2023 by Theotis Haze ORN, NP.  Called Nurse Triage reporting Hypertension.  Symptoms began several days ago.  Interventions attempted: Prescription medications: valsartan  (pt to call to confirm dosage, but rx states 160 mg).  Symptoms are: unchanged.  Triage Disposition: Discuss With PCP and Callback by Nurse Today (overriding See PCP When Office is Open (Within 3 Days))  Patient/caregiver understands and will follow disposition?: Yes Reason for Disposition  Systolic BP >= 160 OR Diastolic >= 100  Answer Assessment - Initial Assessment Questions Pts daughter checks her BP daily, has been elevated since 01/18/24. Pt states she is a smoker. Was advised by PCP to call to report her BP readings. Pt reports intermittent headache that is not currently occurring.   Per most recent OV: Ongoing management with valsartan  160 mg. Blood pressure to be rechecked in two weeks. Potential increase to 320 mg if elevated. - Continue valsartan  160 mg daily for two weeks. - Recheck blood pressure in two weeks. - Increase valsartan  to 320 mg daily if blood pressure remains high.  Pt states is taking valsartan  in the AM. States Dose is 184 mg, this RN does not see that dosage in her chart. Could be d/t 184 imprint on losartan 160 mg tablet. Pt will call after work to confirm dosage on bottle.  ED/911 if symptoms develop.  1. BLOOD PRESSURE: What is your blood pressure? Did you take at least two measurements 5 minutes apart?     Today: 178/101 and then 180/101  2. ONSET: When did you take your blood pressure?     This morning  3. HOW: How did you take your blood pressure? (e.g., automatic home BP monitor, visiting nurse)     Machine  4. HISTORY: Do  you have a history of high blood pressure?     Yes  5. MEDICINES: Are you taking any medicines for blood pressure? Have you missed any doses recently?     Valsartan   6. OTHER SYMPTOMS: Do you have any symptoms? (e.g., blurred vision, chest pain, difficulty breathing, headache, weakness)     Denies, has had intermittent headache previous to today  Protocols used: Blood Pressure - High-A-AH Copied from CRM #[id number]. Topic: Clinical - Red Word Triage >> Jan 22, 2024 10:44 AM Shanda MATSU wrote: Red Word that prompted transfer to Nurse Triage: Patient is reporting BP readings of  12/12: 100/98 12/15: 187/100 12/16: 178/101 and then a couple of mins later 180/103 Along with a headache.

## 2024-01-23 NOTE — Telephone Encounter (Signed)
 Call to patient unable to reach message left. Patient cancelled follow-up blood pressure check with the office nurse on 01/18/2024. Call was to review current medication and to advise that she reschedule appointment to have BP checked. Routing to provider for further advisement. Please recent TE.

## 2024-01-23 NOTE — Telephone Encounter (Signed)
 She was instructed at her visit, if blood pressure continues elevated to increase valsartan  to 320 mg which would be 2 tablets. It does not appear she has done this.

## 2024-01-28 NOTE — Telephone Encounter (Signed)
 Call to patient unable to reach message left.

## 2024-01-28 NOTE — Telephone Encounter (Signed)
 Late entry :  01/24/2027- Call to patient unable to reach message left.

## 2024-02-23 ENCOUNTER — Other Ambulatory Visit: Payer: Self-pay | Admitting: Nurse Practitioner

## 2024-02-23 DIAGNOSIS — I10 Essential (primary) hypertension: Secondary | ICD-10-CM

## 2024-02-25 ENCOUNTER — Telehealth: Payer: Self-pay | Admitting: Nurse Practitioner

## 2024-02-25 DIAGNOSIS — I10 Essential (primary) hypertension: Secondary | ICD-10-CM

## 2024-02-25 NOTE — Telephone Encounter (Unsigned)
 Copied from CRM [id number]. Topic: Clinical - Medication Refill >> Feb 25, 2024 11:31 AM Kendralyn S wrote: Medication: valsartan  (DIOVAN ) 160 MG tablet  Has the patient contacted their pharmacy? Yes (Agent: If no, request that the patient contact the pharmacy for the refill. If patient does not wish to contact the pharmacy document the reason why and proceed with request.) (Agent: If yes, when and what did the pharmacy advise?)  This is the patient's preferred pharmacy:  [facility] [phone], [place] - [address] [place] [address] Phone: [phone] Fax: [phone]  Is this the correct pharmacy for this prescription? Yes If no, delete pharmacy and type the correct one.   Has the prescription been filled recently? No  Is the patient out of the medication? Yes  Has the patient been seen for an appointment in the last year OR does the patient have an upcoming appointment? Yes  Can we respond through MyChart? Yes  Agent: Please be advised that Rx refills may take up to 3 business days. We ask that you follow-up with your pharmacy.

## 2024-02-26 ENCOUNTER — Other Ambulatory Visit: Payer: Self-pay

## 2024-02-26 DIAGNOSIS — I10 Essential (primary) hypertension: Secondary | ICD-10-CM

## 2024-02-26 MED ORDER — VALSARTAN 160 MG PO TABS: 160.0000 mg | ORAL_TABLET | Freq: Every day | ORAL | 1 refills | Status: AC

## 2024-02-26 NOTE — Telephone Encounter (Signed)
 Copied from CRM [id number]. Topic: Clinical - Prescription Issue >> Feb 26, 2024  2:42 PM   Alexandria E wrote:  Reason for CRM: Patient has not received her valsartan  medication. Agent relayed that the receipt was confirmed by the pharmacy on 1/18, but saw an attached note that stated DX code needed.

## 2024-02-26 NOTE — Telephone Encounter (Signed)
 Requested Prescriptions  Refused Prescriptions Disp Refills   valsartan  (DIOVAN ) 160 MG tablet 90 tablet 1    Sig: Take 1 tablet (160 mg total) by mouth daily.     Cardiovascular:  Angiotensin Receptor Blockers Failed - 02/26/2024 11:27 AM      Failed - Last BP in normal range    BP Readings from Last 1 Encounters:  12/28/23 (!) 190/101         Passed - Cr in normal range and within 180 days    Creat  Date Value Ref Range Status  11/02/2010 0.68 0.50 - 1.10 mg/dL Final   Creatinine, Ser  Date Value Ref Range Status  10/27/2023 0.67 0.44 - 1.00 mg/dL Final         Passed - K in normal range and within 180 days    Potassium  Date Value Ref Range Status  10/27/2023 4.1 3.5 - 5.1 mmol/L Final         Passed - Patient is not pregnant      Passed - Valid encounter within last 6 months    Recent Outpatient Visits           2 months ago Primary hypertension   [facility] Of [place]. [facility] Theotis Haze ORN, NP   5 months ago Primary hypertension   [facility] - [facility] Dept Of [place]. [facility] Theotis Haze ORN, NP   1 year ago Essential hypertension   [facility] Dept Of [place]. [facility] Fleeta Tonia Garnette LITTIE, RPH-CPP   1 year ago Epigastric pain   [facility] Dept Of [place]. [facility] [place], Jon HERO, [place]   1 year ago Primary hypertension   [facility] - [facility] Dept Of [place]. [facility] Theotis Haze ORN, [place]

## 2024-02-26 NOTE — Telephone Encounter (Signed)
 Patient identified by name and date of birth.  Medication sent.

## 2024-02-28 NOTE — Progress Notes (Unsigned)
 "  Chief Complaint: Epigastric pain Primary GI MD: Dr. Abran  HPI: Discussed the use of AI scribe software for clinical note transcription with the patient, who gave verbal consent to proceed.  History of Present Illness   Evelyn Crawford is a 64 year old female with a pancreatic cyst and prior pancreatitis who presents for routine surveillance imaging.  A pancreatic IPMN was identified on MRI in July 2024, and she is undergoing annual surveillance. She experienced an episode of what she thought was pancreatitis in September 2025, characterized by upper abdominal pain that resolved after a single dose of pain medication and rest. Subsequent CT imaging and laboratory studies were performed after the episode in September 2025 which were negative.  Currently, she denies abdominal pain, reflux, heartburn, nausea, vomiting, and changes in bowel habits.   Alcohol intake consists of a pint bottle with ginger ale on some days, totaling a few drinks per week. She does not report excessive or daily alcohol use.   PREVIOUS GI WORKUP   MRI/MRCP was performed August 08, 2022. 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 Follow-up MRI in 1 year recommended  She last had colonoscopy elsewhere in 2011.   Past Medical History:  Diagnosis Date   Alcohol abuse    Fatty liver    Hypertension    Iron deficiency anemia    Pancreatitis    Thrombocytopenia    Tobacco abuse    Uterine fibroid     Past Surgical History:  Procedure Laterality Date   BACK SURGERY     CESAREAN SECTION     3X   CYSTOSCOPY  08/29/2022   Procedure: CYSTOSCOPY;  Surgeon: Eldonna Mays, MD;  Location: [facility];  Service: Gynecology;;   LAPAROTOMY WITH STAGING N/A 08/29/2022   Procedure: MINI LAPAROTOMY;  Surgeon: Eldonna Mays, MD;  Location: [facility];  Service: Gynecology;  Laterality: N/A;   ROBOTIC ASSISTED  TOTAL HYSTERECTOMY WITH BILATERAL SALPINGO OOPHERECTOMY Bilateral 08/29/2022   Procedure: XI ROBOTIC ASSISTED TOTAL HYSTERECTOMY WITH BILATERAL SALPINGO OOPHORECTOMY GREATER THAN 250GRAMS;  Surgeon: Eldonna Mays, MD;  Location: [facility];  Service: Gynecology;  Laterality: Bilateral;    Current Outpatient Medications  Medication Sig Dispense Refill   atorvastatin  (LIPITOR) 20 MG tablet TAKE 1 TABLET BY MOUTH EVERY DAY 90 tablet 1   Blood Pressure Monitoring (BLOOD PRESSURE CUFF) MISC Use to check blood pressure once daily. 1 each 0   HYDROcodone -acetaminophen  (NORCO/VICODIN) 5-325 MG tablet Take 1 tablet by mouth every 6 (six) hours as needed for severe pain (pain score 7-10). 10 tablet 0   pantoprazole  (PROTONIX ) 20 MG tablet Take 1 tablet (20 mg total) by mouth daily. FOR ACID REFLUX 90 tablet 1   pregabalin  (LYRICA ) 75 MG capsule Take 1 capsule (75 mg total) by mouth 2 (two) times daily. 60 capsule 1   senna-docusate (SENOKOT-S) 8.6-50 MG tablet Take 2 tablets by mouth at bedtime. For AFTER surgery, do not take if having diarrhea (Patient not taking: Reported on 12/28/2023) 30 tablet 0   valsartan  (DIOVAN ) 160 MG tablet Take 1 tablet (160 mg total) by mouth daily. 90 tablet 1   No current facility-administered medications for this visit.    Allergies as of 02/29/2024 - Review Complete 12/28/2023  Allergen Reaction Noted   Ibuprofen  Nausea Only 09/06/2022    Family History  Problem Relation Age of Onset   Fibroids Mother  Heart attack Mother    Brain cancer Father    Fibroids Sister    Colon cancer Neg Hx    Stomach cancer Neg Hx    Esophageal cancer Neg Hx    Breast cancer Neg Hx    Ovarian cancer Neg Hx    Endometrial cancer Neg Hx     Social History   Socioeconomic History   Marital status: Single    Spouse name: Not on file   Number of children: 3   Years of education: Not on file   Highest education level: Not on file  Occupational History   Occupation: chemical engineer  Tobacco Use   Smoking status: Every Day    Current packs/day: 0.25    Average packs/day: 0.3 packs/day for 23.0 years (5.8 ttl pk-yrs)    Types: Cigarettes   Smokeless tobacco: Never  Vaping Use   Vaping status: Never Used  Substance and Sexual Activity   Alcohol use: Yes    Comment: daily 24 ounce beer every night drink liquor on weekend (Occ)   Drug use: No   Sexual activity: Yes    Birth control/protection: Post-menopausal  Other Topics Concern   Not on file  Social History Narrative   Patient needs to submit further paperwork to complete   Barnie Potters  November 29, 2009 11:51 AM      Unemployed   Previously worked at a school for 11 years, 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  Lives with grown daughter.  Pt is single, sexually active with men only.          Social Drivers of Health   Tobacco Use: High Risk (12/28/2023)   Patient History    Smoking Tobacco Use: Every Day    Smokeless Tobacco Use: Never    Passive Exposure: Not on file  Financial Resource Strain: Low Risk (07/25/2022)   [facility]   Overall Financial Resource Strain (CARDIA)    Difficulty of Paying Living Expenses: Not hard at all  Food Insecurity: Food Insecurity Present (01/01/2023)   Hunger Vital Sign    Worried About Running Out of Food in the Last Year: Sometimes true    Ran Out of Food in the Last Year: Sometimes true  Transportation Needs: No Transportation Needs (07/25/2022)   Received from [facility]   PRAPARE - Transportation    Lack of Transportation (Medical): No    Lack of Transportation (Non-Medical): No  Physical Activity: Inactive (01/01/2023)   Exercise Vital Sign    Days of Exercise per Week: 0 days    Minutes of Exercise per Session: 0 min  Stress: No Stress Concern Present (01/01/2023)   [employer] of Occupational Health - Occupational Stress Questionnaire    Feeling of Stress : Not at all  Social Connections: Socially Isolated  (01/01/2023)   Social Connection and Isolation Panel    Frequency of Communication with Friends and Family: More than three times a week    Frequency of Social Gatherings with Friends and Family: Twice a week    Attends Religious Services: Never    Database Administrator or Organizations: No    Attends Banker Meetings: Never    Marital Status: Never married  Intimate Partner Violence: Not on file  Depression (PHQ2-9): Low Risk (12/28/2023)   Depression (PHQ2-9)    PHQ-2 Score: 2  Alcohol Screen: Not on file  Housing: Low Risk (01/01/2023)   Housing    Last  Housing Risk Score: 0  Utilities: Not At Risk (01/01/2023)   AHC Utilities    Threatened with loss of utilities: No  Health Literacy: Adequate Health Literacy (01/01/2023)   B1300 Health Literacy    Frequency of need for help with medical instructions: Never    Review of Systems:    Constitutional: No weight loss, fever, chills, weakness or fatigue HEENT: Eyes: No change in vision               Ears, Nose, Throat:  No change in hearing or congestion Skin: No rash or itching Cardiovascular: No chest pain, chest pressure or palpitations   Respiratory: No SOB or cough Gastrointestinal: See HPI and otherwise negative Genitourinary: No dysuria or change in urinary frequency Neurological: No headache, dizziness or syncope Musculoskeletal: No new muscle or joint pain Hematologic: No bleeding or bruising Psychiatric: No history of depression or anxiety    Physical Exam:  Vital signs: LMP 09/17/2010   Constitutional: NAD, alert and cooperative Head:  Normocephalic and atraumatic. Eyes:   PEERL, EOMI. No icterus. Conjunctiva pink. Respiratory: Respirations even and unlabored. Lungs clear to auscultation bilaterally.   No wheezes, crackles, or rhonchi.  Cardiovascular:  Regular rate and rhythm. No peripheral edema, cyanosis or pallor.  Gastrointestinal:  Soft, nondistended, nontender. No rebound or guarding.  Normal bowel sounds. No appreciable masses or hepatomegaly. Rectal:  Declines Msk:  Symmetrical without gross deformities. Without edema, no deformity or joint abnormality.  Neurologic:  Alert and  oriented x4;  grossly normal neurologically.  Skin:   Dry and intact without significant lesions or rashes. Psychiatric: Oriented to person, place and time. Demonstrates good judgement and reason without abnormal affect or behaviors.  Physical Exam    RELEVANT LABS AND IMAGING: CBC    Component Value Date/Time   WBC 5.7 10/27/2023 1205   RBC 4.41 10/27/2023 1205   HGB 13.1 10/27/2023 1205   HGB 12.5 12/06/2021 1638   HCT 42.2 10/27/2023 1205   HCT 37.4 12/06/2021 1638   PLT 158 10/27/2023 1205   PLT 161 12/06/2021 1638   MCV 95.7 10/27/2023 1205   MCV 92 12/06/2021 1638   MCH 29.7 10/27/2023 1205   MCHC 31.0 10/27/2023 1205   RDW 12.3 10/27/2023 1205   RDW 12.3 12/06/2021 1638   LYMPHSABS 2.4 12/06/2021 1638   MONOABS 0.5 08/02/2010 1606   EOSABS 0.1 12/06/2021 1638   BASOSABS 0.0 12/06/2021 1638    CMP     Component Value Date/Time   NA 142 10/27/2023 1205   NA 142 09/26/2023 1558   K 4.1 10/27/2023 1205   CL 106 10/27/2023 1205   CO2 24 10/27/2023 1205   GLUCOSE 115 (H) 10/27/2023 1205   BUN 8 10/27/2023 1205   BUN 9 09/26/2023 1558   CREATININE 0.67 10/27/2023 1205   CREATININE 0.68 11/02/2010 1346   CALCIUM  10.3 10/27/2023 1205   PROT 7.6 10/27/2023 1205   PROT 7.2 09/26/2023 1558   ALBUMIN 4.4 10/27/2023 1205   ALBUMIN 4.2 09/26/2023 1558   AST 26 10/27/2023 1205   ALT 18 10/27/2023 1205   ALKPHOS 93 10/27/2023 1205   BILITOT 0.5 10/27/2023 1205   BILITOT 0.4 09/26/2023 1558   GFRNONAA >60 10/27/2023 1205   GFRAA 109 01/16/2019 1101     Assessment/Plan:   Previous history of alcoholic pancreatitis MRCP July 2024 with 1 cm septated cystic lesion with communication with main pancreatic duct compatible with sidebranch IPMN.  No recent episodes of  pancreatitis, patient continues  to drink alcohol.  Last labs including CBC, CMP, lipase were unrevealing.  No issues today. - Schedule surveillance MRCP - Advised complete alcohol cessation  Colon cancer screening Previous colonoscopy elsewhere in 2011 which was negative, declined colon cancer screening at last visit Dr. Abran 2024.  Extensive discussion with patient today about colon cancer screening including Cologuard and colonoscopy.  Patient declines today and will call back if she changes her mind - Declines colon cancer screening order for colonoscopy   Nestor Mollie DEVONNA [facility] 02/28/2024, 3:32 PM  Cc: Theotis Haze ORN, NP "

## 2024-02-29 ENCOUNTER — Ambulatory Visit: Admitting: Gastroenterology

## 2024-02-29 ENCOUNTER — Encounter: Payer: Self-pay | Admitting: Gastroenterology

## 2024-02-29 VITALS — BP 160/90 | HR 97 | Ht 67.0 in | Wt 159.0 lb

## 2024-02-29 DIAGNOSIS — Z8719 Personal history of other diseases of the digestive system: Secondary | ICD-10-CM

## 2024-02-29 DIAGNOSIS — F1010 Alcohol abuse, uncomplicated: Secondary | ICD-10-CM

## 2024-02-29 DIAGNOSIS — K869 Disease of pancreas, unspecified: Secondary | ICD-10-CM

## 2024-02-29 DIAGNOSIS — Z1211 Encounter for screening for malignant neoplasm of colon: Secondary | ICD-10-CM

## 2024-02-29 DIAGNOSIS — F1090 Alcohol use, unspecified, uncomplicated: Secondary | ICD-10-CM

## 2024-02-29 DIAGNOSIS — K859 Acute pancreatitis without necrosis or infection, unspecified: Secondary | ICD-10-CM

## 2024-02-29 DIAGNOSIS — Z720 Tobacco use: Secondary | ICD-10-CM

## 2024-02-29 NOTE — Patient Instructions (Signed)
 You have been scheduled for an MRI at [facility]  on 03/12/24. Your appointment time is 9:00 am. Please arrive to admitting (at main entrance of the hospital) 30 minutes prior to your appointment time for registration purposes. Please make certain not to have anything to eat or drink 6 hours prior to your test. In addition, if you have any metal in your body, have a pacemaker or defibrillator, please be sure to let your ordering physician know. This test typically takes 45 minutes to 1 hour to complete. Should you need to reschedule, please call [phone] to do so.  _______________________________________________________  If your blood pressure at your visit was 140/90 or greater, please contact your primary care physician to follow up on this.  _______________________________________________________  If you are age 35 or older, your body mass index should be between 23-30. Your Body mass index is 24.9 kg/m. If this is out of the aforementioned range listed, please consider follow up with your Primary Care Provider.  If you are age 32 or younger, your body mass index should be between 19-25. Your Body mass index is 24.9 kg/m. If this is out of the aformentioned range listed, please consider follow up with your Primary Care Provider.   ________________________________________________________  The [facility] providers would like to encourage you to use MYCHART to communicate with providers for non-urgent requests or questions.  Due to long hold times on the telephone, sending your provider a message by [facility] may be a faster and more efficient way to get a response.  Please allow 48 business hours for a response.  Please remember that this is for non-urgent requests.  _______________________________________________________  [facility] is using a team-based approach to care.  Your team is made up of your doctor and two to three APPS. Our APPS (Nurse Practitioners and Physician  Assistants) work with your physician to ensure care continuity for you. They are fully qualified to address your health concerns and develop a treatment plan. They communicate directly with your gastroenterologist to care for you. Seeing the Advanced Practice Practitioners on your physician's team can help you by facilitating care more promptly, often allowing for earlier appointments, access to diagnostic testing, procedures, and other specialty referrals.   Due to recent changes in healthcare laws, you may see the results of your imaging and laboratory studies on MyChart before your provider has had a chance to review them.  We understand that in some cases there may be results that are confusing or concerning to you. Not all laboratory results come back in the same time frame and the provider may be waiting for multiple results in order to interpret others.  Please give us  48 hours in order for your provider to thoroughly review all the results before contacting the office for clarification of your results.   Thank you for choosing me and [facility].

## 2024-02-29 NOTE — Progress Notes (Signed)
 Noted.

## 2024-03-12 ENCOUNTER — Other Ambulatory Visit: Payer: Self-pay | Admitting: Gastroenterology

## 2024-03-12 ENCOUNTER — Ambulatory Visit (HOSPITAL_COMMUNITY): Admission: RE | Admit: 2024-03-12 | Discharge: 2024-03-12 | Attending: Gastroenterology

## 2024-03-12 DIAGNOSIS — K859 Acute pancreatitis without necrosis or infection, unspecified: Secondary | ICD-10-CM

## 2024-03-12 DIAGNOSIS — K869 Disease of pancreas, unspecified: Secondary | ICD-10-CM

## 2024-03-12 MED ORDER — GADOBUTROL 1 MMOL/ML IV SOLN
7.0000 mL | Freq: Once | INTRAVENOUS | Status: AC | PRN
  Administered 2024-03-12: 7 mL via INTRAVENOUS
